# Patient Record
Sex: Female | Born: 1985
Health system: Southern US, Community
[De-identification: ages and names within clinical notes are randomized; demographics above are authoritative.]

## PROBLEM LIST (undated history)

## (undated) ENCOUNTER — Inpatient Hospital Stay (HOSPITAL_COMMUNITY): Payer: Self-pay

## (undated) DIAGNOSIS — I517 Cardiomegaly: Secondary | ICD-10-CM

## (undated) DIAGNOSIS — S61419A Laceration without foreign body of unspecified hand, initial encounter: Secondary | ICD-10-CM

## (undated) DIAGNOSIS — J40 Bronchitis, not specified as acute or chronic: Secondary | ICD-10-CM

## (undated) DIAGNOSIS — D134 Benign neoplasm of liver: Secondary | ICD-10-CM

## (undated) DIAGNOSIS — G4730 Sleep apnea, unspecified: Secondary | ICD-10-CM

## (undated) DIAGNOSIS — B009 Herpesviral infection, unspecified: Secondary | ICD-10-CM

## (undated) DIAGNOSIS — G039 Meningitis, unspecified: Secondary | ICD-10-CM

## (undated) DIAGNOSIS — N390 Urinary tract infection, site not specified: Secondary | ICD-10-CM

## (undated) DIAGNOSIS — R87629 Unspecified abnormal cytological findings in specimens from vagina: Secondary | ICD-10-CM

## (undated) DIAGNOSIS — R51 Headache: Secondary | ICD-10-CM

## (undated) DIAGNOSIS — J9819 Other pulmonary collapse: Secondary | ICD-10-CM

## (undated) DIAGNOSIS — F419 Anxiety disorder, unspecified: Secondary | ICD-10-CM

## (undated) DIAGNOSIS — E669 Obesity, unspecified: Secondary | ICD-10-CM

## (undated) DIAGNOSIS — N83201 Unspecified ovarian cyst, right side: Secondary | ICD-10-CM

## (undated) HISTORY — PX: OTHER SURGICAL HISTORY: SHX169

## (undated) HISTORY — PX: COLPOSCOPY: SHX161

---
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
## 2007-02-19 ENCOUNTER — Emergency Department (HOSPITAL_COMMUNITY): Admission: EM | Admit: 2007-02-19 | Discharge: 2007-02-19 | Payer: Self-pay | Admitting: Emergency Medicine

## 2007-05-18 ENCOUNTER — Emergency Department (HOSPITAL_COMMUNITY): Admission: EM | Admit: 2007-05-18 | Discharge: 2007-05-18 | Payer: Self-pay | Admitting: Emergency Medicine

## 2007-10-28 ENCOUNTER — Inpatient Hospital Stay (HOSPITAL_COMMUNITY): Admission: AD | Admit: 2007-10-28 | Discharge: 2007-10-28 | Payer: Self-pay | Admitting: Obstetrics & Gynecology

## 2007-11-29 ENCOUNTER — Ambulatory Visit: Payer: Self-pay | Admitting: Internal Medicine

## 2007-11-29 ENCOUNTER — Emergency Department (HOSPITAL_COMMUNITY): Admission: EM | Admit: 2007-11-29 | Discharge: 2007-11-29 | Payer: Self-pay | Admitting: Emergency Medicine

## 2007-11-29 ENCOUNTER — Encounter (INDEPENDENT_AMBULATORY_CARE_PROVIDER_SITE_OTHER): Payer: Self-pay | Admitting: Family Medicine

## 2007-11-29 LAB — CONVERTED CEMR LAB
ALT: 9 units/L (ref 0–35)
AST: 13 units/L (ref 0–37)
Basophils Absolute: 0 10*3/uL (ref 0.0–0.1)
Basophils Relative: 0 % (ref 0–1)
Calcium: 9 mg/dL (ref 8.4–10.5)
Chloride: 108 meq/L (ref 96–112)
Creatinine, Ser: 0.78 mg/dL (ref 0.40–1.20)
Free T4: 1.16 ng/dL (ref 0.89–1.80)
MCHC: 30.5 g/dL (ref 30.0–36.0)
Monocytes Absolute: 0.6 10*3/uL (ref 0.1–1.0)
Neutro Abs: 3.8 10*3/uL (ref 1.7–7.7)
Neutrophils Relative %: 53 % (ref 43–77)
Platelets: 310 10*3/uL (ref 150–400)
RDW: 15.7 % — ABNORMAL HIGH (ref 11.5–15.5)
Sodium: 142 meq/L (ref 135–145)
TSH: 2.539 microintl units/mL (ref 0.350–4.50)
Total CHOL/HDL Ratio: 2.8
VLDL: 8 mg/dL (ref 0–40)

## 2007-12-03 ENCOUNTER — Ambulatory Visit: Payer: Self-pay | Admitting: *Deleted

## 2008-01-30 ENCOUNTER — Emergency Department (HOSPITAL_COMMUNITY): Admission: EM | Admit: 2008-01-30 | Discharge: 2008-01-30 | Payer: Self-pay | Admitting: Emergency Medicine

## 2008-04-09 ENCOUNTER — Inpatient Hospital Stay (HOSPITAL_COMMUNITY): Admission: AD | Admit: 2008-04-09 | Discharge: 2008-04-09 | Payer: Self-pay | Admitting: Obstetrics & Gynecology

## 2008-12-22 ENCOUNTER — Observation Stay (HOSPITAL_COMMUNITY): Admission: AD | Admit: 2008-12-22 | Discharge: 2008-12-23 | Payer: Self-pay | Admitting: Obstetrics and Gynecology

## 2008-12-22 ENCOUNTER — Ambulatory Visit: Payer: Self-pay | Admitting: Obstetrics and Gynecology

## 2009-01-02 ENCOUNTER — Inpatient Hospital Stay (HOSPITAL_COMMUNITY): Admission: AD | Admit: 2009-01-02 | Discharge: 2009-01-02 | Payer: Self-pay | Admitting: Obstetrics & Gynecology

## 2009-01-02 ENCOUNTER — Ambulatory Visit: Payer: Self-pay | Admitting: Obstetrics and Gynecology

## 2009-01-07 ENCOUNTER — Inpatient Hospital Stay (HOSPITAL_COMMUNITY): Admission: AD | Admit: 2009-01-07 | Discharge: 2009-01-07 | Payer: Self-pay | Admitting: Obstetrics & Gynecology

## 2009-04-02 ENCOUNTER — Inpatient Hospital Stay (HOSPITAL_COMMUNITY): Admission: AD | Admit: 2009-04-02 | Discharge: 2009-04-02 | Payer: Self-pay | Admitting: Obstetrics and Gynecology

## 2009-05-12 ENCOUNTER — Inpatient Hospital Stay (HOSPITAL_COMMUNITY): Admission: AD | Admit: 2009-05-12 | Discharge: 2009-05-12 | Payer: Self-pay | Admitting: Obstetrics and Gynecology

## 2009-06-25 ENCOUNTER — Inpatient Hospital Stay (HOSPITAL_COMMUNITY): Admission: AD | Admit: 2009-06-25 | Discharge: 2009-06-25 | Payer: Self-pay | Admitting: Obstetrics and Gynecology

## 2009-07-19 ENCOUNTER — Inpatient Hospital Stay (HOSPITAL_COMMUNITY): Admission: AD | Admit: 2009-07-19 | Discharge: 2009-07-19 | Payer: Self-pay | Admitting: Obstetrics and Gynecology

## 2009-08-24 ENCOUNTER — Inpatient Hospital Stay (HOSPITAL_COMMUNITY): Admission: AD | Admit: 2009-08-24 | Discharge: 2009-08-27 | Payer: Self-pay | Admitting: Obstetrics and Gynecology

## 2009-09-02 ENCOUNTER — Inpatient Hospital Stay (HOSPITAL_COMMUNITY): Admission: AD | Admit: 2009-09-02 | Discharge: 2009-09-02 | Payer: Self-pay | Admitting: Obstetrics and Gynecology

## 2009-11-25 ENCOUNTER — Emergency Department (HOSPITAL_COMMUNITY): Admission: EM | Admit: 2009-11-25 | Discharge: 2009-11-25 | Payer: Self-pay | Admitting: Emergency Medicine

## 2010-07-10 LAB — URINALYSIS, ROUTINE W REFLEX MICROSCOPIC
Glucose, UA: NEGATIVE mg/dL
Hgb urine dipstick: NEGATIVE
Ketones, ur: 40 mg/dL — AB
Protein, ur: 30 mg/dL — AB

## 2010-07-10 LAB — WET PREP, GENITAL
Clue Cells Wet Prep HPF POC: NONE SEEN
Trich, Wet Prep: NONE SEEN
Yeast Wet Prep HPF POC: NONE SEEN

## 2010-07-10 LAB — URINE MICROSCOPIC-ADD ON

## 2010-07-12 ENCOUNTER — Emergency Department (HOSPITAL_COMMUNITY)
Admission: EM | Admit: 2010-07-12 | Discharge: 2010-07-12 | Disposition: A | Discharge status: Home / Self Care | Payer: Self-pay | Attending: Emergency Medicine | Admitting: Emergency Medicine

## 2010-07-12 DIAGNOSIS — R197 Diarrhea, unspecified: Secondary | ICD-10-CM | POA: Insufficient documentation

## 2010-07-12 DIAGNOSIS — R112 Nausea with vomiting, unspecified: Secondary | ICD-10-CM | POA: Insufficient documentation

## 2010-07-12 DIAGNOSIS — F341 Dysthymic disorder: Secondary | ICD-10-CM | POA: Insufficient documentation

## 2010-07-12 LAB — DIFFERENTIAL
Basophils Absolute: 0 10*3/uL (ref 0.0–0.1)
Basophils Relative: 0 % (ref 0–1)
Eosinophils Relative: 0 % (ref 0–5)
Lymphocytes Relative: 22 % (ref 12–46)
Neutro Abs: 6.1 10*3/uL (ref 1.7–7.7)

## 2010-07-12 LAB — CBC
HCT: 27 % — ABNORMAL LOW (ref 36.0–46.0)
HCT: 31.5 % — ABNORMAL LOW (ref 36.0–46.0)
Hemoglobin: 10.7 g/dL — ABNORMAL LOW (ref 12.0–15.0)
MCHC: 31.4 g/dL (ref 30.0–36.0)
MCHC: 31.8 g/dL (ref 30.0–36.0)
MCV: 78 fL (ref 78.0–100.0)
MCV: 78.7 fL (ref 78.0–100.0)
Platelets: 199 10*3/uL (ref 150–400)
Platelets: 319 10*3/uL (ref 150–400)
RBC: 4.31 MIL/uL (ref 3.87–5.11)
RDW: 16.1 % — ABNORMAL HIGH (ref 11.5–15.5)
RDW: 17.1 % — ABNORMAL HIGH (ref 11.5–15.5)

## 2010-07-12 LAB — URINALYSIS, ROUTINE W REFLEX MICROSCOPIC
Ketones, ur: >80 mg/dL — AB
Nitrite: NEGATIVE
Protein, ur: NEGATIVE mg/dL
Urobilinogen, UA: 1 mg/dL (ref 0.0–1.0)
pH: 6.5 (ref 5.0–8.0)

## 2010-07-17 LAB — URINALYSIS, ROUTINE W REFLEX MICROSCOPIC
Bilirubin Urine: NEGATIVE
Ketones, ur: NEGATIVE mg/dL
Nitrite: NEGATIVE
Protein, ur: NEGATIVE mg/dL
Specific Gravity, Urine: 1.02 (ref 1.005–1.030)
Urobilinogen, UA: 4 mg/dL — ABNORMAL HIGH (ref 0.0–1.0)

## 2010-07-26 LAB — URINALYSIS, ROUTINE W REFLEX MICROSCOPIC
Glucose, UA: NEGATIVE mg/dL
Hgb urine dipstick: NEGATIVE
Specific Gravity, Urine: 1.02 (ref 1.005–1.030)
pH: 6.5 (ref 5.0–8.0)

## 2010-07-26 LAB — URINE MICROSCOPIC-ADD ON

## 2010-07-29 LAB — URINALYSIS, ROUTINE W REFLEX MICROSCOPIC
Bilirubin Urine: NEGATIVE
Hgb urine dipstick: NEGATIVE
Ketones, ur: >80 mg/dL — AB
Ketones, ur: 15 mg/dL — AB
Nitrite: NEGATIVE
Nitrite: NEGATIVE
Protein, ur: 100 mg/dL — AB
Specific Gravity, Urine: 1.01 (ref 1.005–1.030)
pH: 7.5 (ref 5.0–8.0)

## 2010-07-29 LAB — URINE CULTURE: Colony Count: >=100000

## 2010-07-29 LAB — GC/CHLAMYDIA PROBE AMP, GENITAL
Chlamydia, DNA Probe: NEGATIVE
GC Probe Amp, Genital: NEGATIVE

## 2010-07-29 LAB — URINE MICROSCOPIC-ADD ON

## 2010-07-29 LAB — WET PREP, GENITAL
Trich, Wet Prep: NONE SEEN
Yeast Wet Prep HPF POC: NONE SEEN

## 2010-07-30 LAB — COMPREHENSIVE METABOLIC PANEL
AST: 17 U/L (ref 0–37)
Albumin: 4 g/dL (ref 3.5–5.2)
Alkaline Phosphatase: 62 U/L (ref 39–117)
BUN: 7 mg/dL (ref 6–23)
Chloride: 104 mEq/L (ref 96–112)
Potassium: 3.4 mEq/L — ABNORMAL LOW (ref 3.5–5.1)
Total Bilirubin: 0.6 mg/dL (ref 0.3–1.2)

## 2010-07-30 LAB — URINE MICROSCOPIC-ADD ON

## 2010-07-30 LAB — CBC
HCT: 35.9 % — ABNORMAL LOW (ref 36.0–46.0)
Platelets: 268 10*3/uL (ref 150–400)
RBC: 4.52 MIL/uL (ref 3.87–5.11)
WBC: 11.7 10*3/uL — ABNORMAL HIGH (ref 4.0–10.5)

## 2010-07-30 LAB — URINALYSIS, ROUTINE W REFLEX MICROSCOPIC
Bilirubin Urine: NEGATIVE
Glucose, UA: NEGATIVE mg/dL
Hgb urine dipstick: NEGATIVE
Ketones, ur: >80 mg/dL — AB
Protein, ur: NEGATIVE mg/dL
Urobilinogen, UA: 4 mg/dL — ABNORMAL HIGH (ref 0.0–1.0)

## 2010-07-30 LAB — POCT PREGNANCY, URINE: Preg Test, Ur: POSITIVE

## 2011-01-12 LAB — POCT PREGNANCY, URINE: Operator id: 284141

## 2011-01-19 LAB — URINALYSIS, ROUTINE W REFLEX MICROSCOPIC
Glucose, UA: NEGATIVE
Specific Gravity, Urine: 1.02

## 2011-01-19 LAB — URINE MICROSCOPIC-ADD ON

## 2011-01-19 LAB — GC/CHLAMYDIA PROBE AMP, GENITAL: GC Probe Amp, Genital: NEGATIVE

## 2011-01-27 LAB — URINALYSIS, ROUTINE W REFLEX MICROSCOPIC
Glucose, UA: NEGATIVE mg/dL
Specific Gravity, Urine: 1.02 (ref 1.005–1.030)

## 2011-01-27 LAB — URINE MICROSCOPIC-ADD ON

## 2011-01-27 LAB — GC/CHLAMYDIA PROBE AMP, GENITAL
Chlamydia, DNA Probe: NEGATIVE
GC Probe Amp, Genital: NEGATIVE

## 2011-01-27 LAB — WET PREP, GENITAL

## 2011-02-03 LAB — GC/CHLAMYDIA PROBE AMP, GENITAL
Chlamydia, DNA Probe: NEGATIVE
GC Probe Amp, Genital: NEGATIVE

## 2011-02-03 LAB — WET PREP, GENITAL

## 2011-02-03 LAB — URINE MICROSCOPIC-ADD ON

## 2011-02-03 LAB — URINALYSIS, ROUTINE W REFLEX MICROSCOPIC
Bilirubin Urine: NEGATIVE
Glucose, UA: NEGATIVE
Specific Gravity, Urine: 1.02
Urobilinogen, UA: 0.2

## 2011-02-20 ENCOUNTER — Inpatient Hospital Stay (INDEPENDENT_AMBULATORY_CARE_PROVIDER_SITE_OTHER)
Admission: RE | Admit: 2011-02-20 | Discharge: 2011-02-20 | Disposition: A | Discharge status: Home / Self Care | Payer: Self-pay | Source: Ambulatory Visit | Attending: Family Medicine | Admitting: Family Medicine

## 2011-02-20 DIAGNOSIS — H00019 Hordeolum externum unspecified eye, unspecified eyelid: Secondary | ICD-10-CM

## 2011-10-16 ENCOUNTER — Other Ambulatory Visit: Payer: Self-pay | Admitting: Obstetrics and Gynecology

## 2011-10-16 NOTE — Telephone Encounter (Signed)
Triage/epic 

## 2011-10-19 NOTE — Telephone Encounter (Signed)
PT CALLED REGARDING MSG, PT INFORMED HAS NOT BEEN SEEN IN >18 MONTHS, NEEDS TO SCHED AEX, PT STATES HAS NO INSURANCE RIGHT NOW, PT INFORMED IS ABOUT $179 OOP, PT ASKS CAN PAYMENT ARRANGEMENTS BE MADE.  INFORMED MSG LEFT FOR TS REGARDING ARRANGEMENTS, WILL CALL BACK TODAY WITH RESPONS, PT VOICES AGREEMENT.

## 2011-10-20 ENCOUNTER — Encounter (HOSPITAL_COMMUNITY): Payer: Self-pay | Admitting: Emergency Medicine

## 2011-10-20 ENCOUNTER — Emergency Department (HOSPITAL_COMMUNITY)
Admission: EM | Admit: 2011-10-20 | Discharge: 2011-10-21 | Disposition: A | Discharge status: Home / Self Care | Payer: Self-pay | Attending: Emergency Medicine | Admitting: Emergency Medicine

## 2011-10-20 ENCOUNTER — Emergency Department (HOSPITAL_COMMUNITY): Payer: Self-pay

## 2011-10-20 DIAGNOSIS — R079 Chest pain, unspecified: Secondary | ICD-10-CM | POA: Insufficient documentation

## 2011-10-20 DIAGNOSIS — M549 Dorsalgia, unspecified: Secondary | ICD-10-CM | POA: Insufficient documentation

## 2011-10-20 LAB — BASIC METABOLIC PANEL
BUN: 13 mg/dL (ref 6–23)
Chloride: 104 mEq/L (ref 96–112)
GFR calc Af Amer: >90 mL/min (ref >90)
Potassium: 3.6 mEq/L (ref 3.5–5.1)
Sodium: 140 mEq/L (ref 135–145)

## 2011-10-20 LAB — CBC WITH DIFFERENTIAL/PLATELET
HCT: 34.9 % — ABNORMAL LOW (ref 36.0–46.0)
Hemoglobin: 11.3 g/dL — ABNORMAL LOW (ref 12.0–15.0)
Lymphocytes Relative: 38 % (ref 12–46)
Lymphs Abs: 3.7 10*3/uL (ref 0.7–4.0)
Monocytes Absolute: 0.7 10*3/uL (ref 0.1–1.0)
Monocytes Relative: 7 % (ref 3–12)
Neutro Abs: 5.1 10*3/uL (ref 1.7–7.7)
Neutrophils Relative %: 52 % (ref 43–77)
RBC: 4.55 MIL/uL (ref 3.87–5.11)
WBC: 9.9 10*3/uL (ref 4.0–10.5)

## 2011-10-20 LAB — POCT I-STAT TROPONIN I

## 2011-10-20 NOTE — ED Notes (Signed)
PT. REPORTS UPPER CHEST Shon Baton Maura Crandall ARM PAIN AND UPPER BACK PAIN WORSE WITH DEEP INSPIRATION ONSET LAST NIGHT , SLIGHT SOB , NO COUGH.

## 2011-10-20 NOTE — ED Notes (Signed)
Updated in w/r 

## 2011-10-21 LAB — D-DIMER, QUANTITATIVE: D-Dimer, Quant: 0.43 ug/mL-FEU (ref 0.00–0.48)

## 2011-10-21 MED ORDER — IBUPROFEN 800 MG PO TABS
800.0000 mg | ORAL_TABLET | Freq: Once | ORAL | Status: AC
  Administered 2011-10-21: 800 mg via ORAL
  Filled 2011-10-21: qty 1

## 2011-10-21 NOTE — ED Notes (Signed)
Patient states that she has pain that starts in her back that radiates to the front and into her arms.  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

## 2011-10-21 NOTE — Discharge Instructions (Signed)
Your chest x-ray is normal.  Your blood tests do not show any signs of damage to your heart.  A blood clot in your lungs or other significant illness.  Use ibuprofen 600 mg every 6 hours for pain.  Alternatively, you can use Tylenol 500 mg every 6 hours for pain.  Followup with your Dr. if your symptoms persist

## 2011-10-21 NOTE — ED Provider Notes (Addendum)
History     CSN: [id number]  Arrival date & time 10/20/11  2007   First MD Initiated Contact with Patient 10/20/11 2354      Chief Complaint  Patient presents with  . Chest Pain    (Consider location/radiation/quality/duration/timing/severity/associated sxs/prior treatment) Patient is a 26 y.o. female presenting with chest pain. The history is provided by the patient.  Chest Pain Pertinent negatives for primary symptoms include no fever, no shortness of breath, no cough, no abdominal pain, no nausea and no vomiting.    the patient is a 26 year old, female, with no significant past medical history.  She presents to emergency department complaining of pleuritic chest pain, and back pain.  Since yesterday.  She has not had a cough, fevers, chills, nausea, vomiting, or sweating.  She denies leg pain or swelling.  She does not take birth control pills.  She denies recent travel or surgery.  The patient has been constant.  It does not radiate  History reviewed. No pertinent past medical history.  History reviewed. No pertinent past surgical history.  No family history on file.  History  Substance Use Topics  . Smoking status: Never Smoker   . Smokeless tobacco: Not on file  . Alcohol Use: Yes    OB History    Grav Para Term Preterm Abortions TAB SAB Ect Mult Living                  Review of Systems  Constitutional: Negative for fever and chills.  HENT: Negative for congestion.   Respiratory: Negative for cough, chest tightness and shortness of breath.   Cardiovascular: Positive for chest pain. Negative for leg swelling.  Gastrointestinal: Negative for nausea, vomiting and abdominal pain.  Musculoskeletal: Positive for back pain.  Skin: Negative for rash.  Neurological: Negative for headaches.  Psychiatric/Behavioral: Negative for confusion.  All other systems reviewed and are negative.    Allergies  Review of patient's allergies indicates no known allergies.  Home  Medications   Current Outpatient Rx  Name Route Sig Dispense Refill  . IBUPROFEN 200 MG PO TABS Oral Take 400 mg by mouth every 6 (six) hours as needed. For pain      BP 128/68  Pulse 71  Temp 99 F (37.2 C) (Oral)  Resp 19  SpO2 100%  LMP 10/05/2011  Physical Exam  Nursing note and vitals reviewed. Constitutional: She is oriented to person, place, and time. She appears well-developed and well-nourished. No distress.  HENT:  Head: Normocephalic and atraumatic.  Eyes: Conjunctivae are normal.  Neck: Normal range of motion.  Cardiovascular: Normal rate.   No murmur heard. Pulmonary/Chest: Effort normal and breath sounds normal. She has no rales.  Abdominal: Soft. She exhibits no distension.  Musculoskeletal: Normal range of motion. She exhibits no edema and no tenderness.  Neurological: She is alert and oriented to person, place, and time.  Skin: Skin is warm and dry.  Psychiatric: She has a normal mood and affect. Thought content normal.    ED Course  Procedures (including critical care time) 26 year old, obese, female, complains of for chest pain, and back pain.  Since yesterday.  She has no risk factors for pulmonary embolism, and she is without lower extremity, swelling.  Her symptoms are not consistent with pneumonia.  And of course, her is no evidence, that she's got ACS.  Labs Reviewed  BASIC METABOLIC PANEL - Abnormal; Notable for the following:    Glucose, Bld 114 (*)     All  other components within normal limits  CBC WITH DIFFERENTIAL - Abnormal; Notable for the following:    Hemoglobin 11.3 (*)     HCT 34.9 (*)     MCV 76.7 (*)     MCH 24.8 (*)     All other components within normal limits  POCT I-STAT TROPONIN I  D-DIMER, QUANTITATIVE   Dg Chest 2 View  10/20/2011  *RADIOLOGY REPORT*  Clinical Data: Chest pain  CHEST - 2 VIEW  Comparison: 01/30/2008  Findings: Lungs are clear. No pleural effusion or pneumothorax. The cardiomediastinal contours are within  normal limits. The visualized bones and soft tissues are without significant appreciable abnormality.  IMPRESSION: No radiographic evidence of acute cardiopulmonary process.  Original Report Authenticated By: Waneta Martins, M.D.     No diagnosis found.  ECG Normal sinus rhythm, heart rate 83 beats per minute. Normal axis. Normal intervals. Nonspecific T-wave changes  MDM  PLEURITIC CHEST PAIN  No evidence of significant illness.  There is no evidence of a pulmonary embolism.  ACS or cardiopulmonary disease.  The patient is in no distress with no signs of hypoxia, or other disease.        Cheri Guppy, MD 10/21/11 1610  Cheri Guppy, MD 11/19/11 [phone]

## 2012-01-08 ENCOUNTER — Emergency Department (HOSPITAL_COMMUNITY): Payer: Self-pay

## 2012-01-08 ENCOUNTER — Emergency Department (HOSPITAL_COMMUNITY)
Admission: EM | Admit: 2012-01-08 | Discharge: 2012-01-08 | Disposition: A | Discharge status: Home Health | Payer: Self-pay | Attending: Emergency Medicine | Admitting: Emergency Medicine

## 2012-01-08 ENCOUNTER — Encounter (HOSPITAL_COMMUNITY): Payer: Self-pay | Admitting: *Deleted

## 2012-01-08 DIAGNOSIS — J40 Bronchitis, not specified as acute or chronic: Secondary | ICD-10-CM | POA: Insufficient documentation

## 2012-01-08 MED ORDER — DEXTROMETHORPHAN POLISTIREX 30 MG/5ML PO LQCR: 60.0000 mg | ORAL | Status: DC | PRN

## 2012-01-08 MED ORDER — ALBUTEROL SULFATE HFA 108 (90 BASE) MCG/ACT IN AERS
2.0000 | INHALATION_SPRAY | Freq: Once | RESPIRATORY_TRACT | Status: AC
  Administered 2012-01-08: 2 via RESPIRATORY_TRACT
  Filled 2012-01-08: qty 6.7

## 2012-01-08 NOTE — ED Notes (Signed)
Pt discharged home in good condition. 

## 2012-01-08 NOTE — ED Notes (Signed)
Cold cough with a low grade temp for approx one week.  No resp difficulty

## 2012-01-08 NOTE — ED Provider Notes (Signed)
History     CSN: [id number]  Arrival date & time 01/08/12  4098   First MD Initiated Contact with Patient 01/08/12 2029      Chief Complaint  Patient presents with  . Cough    (Consider location/radiation/quality/duration/timing/severity/associated sxs/prior treatment) HPI Comments: Patient reports, that last week.  She had URI symptoms, with some throat thickening and a nonproductive cough.  That is only gotten worse.  She states at first, the cough was mostly during the, day, but, now it's more at night.  She denies any rhinitis, postnasal drip, history of smoking or asthma.  She has not taken any over-the-counter medication, specifically for cough.  Patient is a 26 y.o. female presenting with cough. The history is provided by the patient.  Cough This is a new problem. The current episode started more than 1 week ago. The problem occurs every few minutes. The problem has not changed since onset.The cough is non-productive. Pertinent negatives include no chest pain, no chills, no headaches, no rhinorrhea, no shortness of breath and no wheezing.    History reviewed. No pertinent past medical history.  History reviewed. No pertinent past surgical history.  No family history on file.  History  Substance Use Topics  . Smoking status: Never Smoker   . Smokeless tobacco: Not on file  . Alcohol Use: Yes    OB History    Grav Para Term Preterm Abortions TAB SAB Ect Mult Living                  Review of Systems  Constitutional: Negative for fever and chills.  HENT: Negative for nosebleeds, congestion, rhinorrhea and postnasal drip.   Respiratory: Positive for cough. Negative for shortness of breath, wheezing and stridor.   Cardiovascular: Negative for chest pain and leg swelling.  Gastrointestinal: Negative for abdominal pain.  Neurological: Negative for dizziness, weakness and headaches.    Allergies  Review of patient's allergies indicates no known allergies.  Home  Medications   Current Outpatient Rx  Name Route Sig Dispense Refill  . DEXTROMETHORPHAN POLISTIREX ER 30 MG/5ML PO LQCR Oral Take 10 mLs (60 mg total) by mouth as needed for cough. 89 mL 0    BP 134/111  Pulse 95  Temp 99.1 F (37.3 C) (Oral)  Resp 18  SpO2 97%  LMP 12/16/2011  Physical Exam  Constitutional: She is oriented to person, place, and time. She appears well-developed and well-nourished.  HENT:  Head: Normocephalic.  Eyes: Pupils are equal, round, and reactive to light.  Neck: Normal range of motion.  Cardiovascular: Normal rate.   Pulmonary/Chest: Effort normal. No respiratory distress. She has no wheezes.       Harsh nonproductive cough with exertion, or at the end of a long sentence  Abdominal: Soft.  Musculoskeletal: Normal range of motion.  Neurological: She is alert and oriented to person, place, and time.  Skin: Skin is warm.    ED Course  Procedures (including critical care time)  Labs Reviewed - No data to display Dg Chest 2 View  01/08/2012  *RADIOLOGY REPORT*  Clinical Data: Shortness of breath and cough.  CHEST - 2 VIEW  Comparison: 10/20/2011  Findings: The heart size and pulmonary vascularity are normal and the lungs are clear except for slight peribronchial thickening consistent with bronchitis.  No effusions.  No osseous abnormality.  IMPRESSION: Slight bronchitic changes.   Original Report Authenticated By: Gwynn Burly, M.D.      1. Bronchitis  MDM  I reviewed the patient's x-ray, which reveals bronchitic, changes.  We'll treat with albuterol         Arman Filter, NP 01/08/12 2133  Arman Filter, NP 01/08/12 2133

## 2012-01-09 NOTE — ED Provider Notes (Signed)
Cardiac Catheterization Procedure Note  Name: Tara Bender  MRN: [id number]  DOB: 10/01/1969  Procedure: Left Heart Cath, Selective Coronary Angiography, LV angiography  Indication: A 26 year old gentleman with strong family history of premature coronary artery disease has presented with progressive chest pain. He was referred for cardiac catheterization because of high pretest probability of obstructive CAD and concerns for unstable angina.  Procedural details: The right groin was prepped, draped, and anesthetized with 1% lidocaine. Using modified Seldinger technique, a 5 French sheath was introduced into the right femoral artery. Standard Judkins catheters were used for coronary angiography and left ventriculography. Catheter exchanges were performed over a guidewire. There were no immediate procedural complications. The patient was transferred to the post catheterization recovery area for further monitoring.  Procedural Findings:  Hemodynamics:  AO 93/58  LV 100/7  Coronary angiography:  Coronary dominance: right  Left mainstem: Widely patent with no obstructive disease.  Left anterior descending (LAD): The LAD courses to the left ventricular apex. The vessel is widely patent. It supplies a tiny first diagonal and a large second diagonal branch there is no obstructive disease throughout.  Left circumflex (LCx): The circumflex is a large vessel with no obstructive disease. There is a large first obtuse marginal branch that divides into twin vessels. The AV groove circumflex extends to provide a posterolateral branch distally.  Right coronary artery (RCA): The RCA is dominant. The vessel is smooth throughout its course. There is no obstructive disease visualized. There is an acute marginal branch and a PDA branch supplied.  Left ventriculography: Left ventricular systolic function is normal, LVEF is estimated at 55-65%, there is no significant mitral regurgitation  Final Conclusions:  1. No  obstructive coronary artery disease with widely patent coronary arteries  2. Normal left ventricular function  Recommendations: Suspect noncardiac chest pain. The patient has no apparent cardiac disease.  Tonny Bollman  05/25/2011, 8:15 AM    Celene Kras, MD 01/09/12 [phone]

## 2012-02-02 ENCOUNTER — Emergency Department (HOSPITAL_COMMUNITY)
Admission: EM | Admit: 2012-02-02 | Discharge: 2012-02-02 | Disposition: A | Discharge status: Home / Self Care | Payer: Self-pay | Attending: Emergency Medicine | Admitting: Emergency Medicine

## 2012-02-02 ENCOUNTER — Encounter (HOSPITAL_COMMUNITY): Payer: Self-pay | Admitting: *Deleted

## 2012-02-02 DIAGNOSIS — L309 Dermatitis, unspecified: Secondary | ICD-10-CM

## 2012-02-02 DIAGNOSIS — L259 Unspecified contact dermatitis, unspecified cause: Secondary | ICD-10-CM | POA: Insufficient documentation

## 2012-02-02 DIAGNOSIS — E669 Obesity, unspecified: Secondary | ICD-10-CM | POA: Insufficient documentation

## 2012-02-02 HISTORY — DX: Obesity, unspecified: E66.9

## 2012-02-02 NOTE — ED Notes (Signed)
Pt began loosing her hair in September she then began having a rash on her breasts followed by rash on her chest.  Pt also has pain in her legs and a HA.

## 2012-02-02 NOTE — ED Provider Notes (Signed)
History     CSN: [id number]  Arrival date & time 02/02/12  1339   First MD Initiated Contact with Patient 02/02/12 1858      Chief Complaint  Patient presents with  . Rash    (Consider location/radiation/quality/duration/timing/severity/associated sxs/prior treatment) HPI Complains of rash on chest and underneath her breasts for approximately 3 weeks rash is pruritic and scaly. No treatment prior to coming here no fever. Rash started approximately 3 days after she evaluated for bronchitis. Cough is much improved patient also complains of a slight diffuse headache and slight pain in both legs worse with walking for the past several weeks no other complaint no treatment prior to coming here nothing makes symptoms better or worse Past Medical History  Diagnosis Date  . Obesity     History reviewed. No pertinent past surgical history. Past medical history negative No family history on file.  History  Substance Use Topics  . Smoking status: Never Smoker   . Smokeless tobacco: Not on file  . Alcohol Use: Yes    OB History    Grav Para Term Preterm Abortions TAB SAB Ect Mult Living                  Review of Systems  Musculoskeletal: Positive for myalgias.  Skin: Positive for rash.  Neurological: Positive for headaches.  All other systems reviewed and are negative.    Allergies  Review of patient's allergies indicates no known allergies.  Home Medications   Current Outpatient Rx  Name Route Sig Dispense Refill  . ALBUTEROL SULFATE HFA 108 (90 BASE) MCG/ACT IN AERS Inhalation Inhale 2 puffs into the lungs every 6 (six) hours as needed. For wheezing      BP 132/68  Pulse 88  Temp 98.5 F (36.9 C) (Oral)  Resp 18  SpO2 100%  LMP 01/16/2012  Physical Exam  Nursing note and vitals reviewed. Constitutional: She is oriented to person, place, and time. She appears well-developed and well-nourished.  HENT:  Head: Normocephalic and atraumatic.  Eyes: Conjunctivae  normal are normal. Pupils are equal, round, and reactive to light.  Neck: Neck supple. No tracheal deviation present. No thyromegaly present.  Cardiovascular: Normal rate and regular rhythm.   No murmur heard. Pulmonary/Chest: Effort normal and breath sounds normal.  Abdominal: Soft. Bowel sounds are normal. She exhibits no distension. There is no tenderness.  Musculoskeletal: Normal range of motion. She exhibits no edema and no tenderness.       Lower extremities without redness swelling or tenderness neurovascularly intact  Neurological: She is alert and oriented to person, place, and time. No cranial nerve deficit. Coordination normal.       Gait normal  Skin: Skin is warm and dry. Rash noted.       There is approximately a 3 cm x 5 cm oblong area at left parasternal area which is scaly, shiny pinkish gray. Pruritic Underneath both breasts skin is scaly pinkish gray, pruritic  Psychiatric: She has a normal mood and affect.    ED Course  Procedures (including critical care time)  Labs Reviewed - No data to display No results found.   No diagnosis found.  Patient declined pain medicine for headache or leg pain in the emergency department MDM  Rash is fungal-appearing, Patient states her headache is a normal headache for her Leg pain there is no evidence of phlebitis or infection clinically Plan Tylenol, Lotrimin Referral resource guide Referral  dermatology   Doug Sou, MD 02/02/12 Ernestina [place]

## 2012-02-02 NOTE — ED Notes (Signed)
Pt A.O. X 4. Reports itchy rash in between breasts x 1 week. Denies pain. States "there was a rash a few weeks ago that went away, then this one came". All other skin in tact. Reports hair loss after been treated for Bronchiolitis in mid September 2013. Denies pain at this time. NAD.

## 2012-02-05 ENCOUNTER — Encounter (HOSPITAL_COMMUNITY): Payer: Self-pay

## 2012-03-07 ENCOUNTER — Emergency Department (HOSPITAL_COMMUNITY)
Admission: EM | Admit: 2012-03-07 | Discharge: 2012-03-07 | Disposition: A | Discharge status: Home / Self Care | Payer: Self-pay | Attending: Emergency Medicine | Admitting: Emergency Medicine

## 2012-03-07 DIAGNOSIS — W261XXA Contact with sword or dagger, initial encounter: Secondary | ICD-10-CM | POA: Insufficient documentation

## 2012-03-07 DIAGNOSIS — Y93G3 Activity, cooking and baking: Secondary | ICD-10-CM | POA: Insufficient documentation

## 2012-03-07 DIAGNOSIS — E669 Obesity, unspecified: Secondary | ICD-10-CM | POA: Insufficient documentation

## 2012-03-07 DIAGNOSIS — Y92009 Unspecified place in unspecified non-institutional (private) residence as the place of occurrence of the external cause: Secondary | ICD-10-CM | POA: Insufficient documentation

## 2012-03-07 DIAGNOSIS — S61439A Puncture wound without foreign body of unspecified hand, initial encounter: Secondary | ICD-10-CM

## 2012-03-07 DIAGNOSIS — S61409A Unspecified open wound of unspecified hand, initial encounter: Secondary | ICD-10-CM | POA: Insufficient documentation

## 2012-03-07 DIAGNOSIS — W260XXA Contact with knife, initial encounter: Secondary | ICD-10-CM | POA: Insufficient documentation

## 2012-03-07 MED ORDER — LORAZEPAM 1 MG PO TABS
1.0000 mg | ORAL_TABLET | Freq: Once | ORAL | Status: AC
  Administered 2012-03-07: 1 mg via ORAL
  Filled 2012-03-07: qty 1

## 2012-03-07 MED ORDER — HYDROCODONE-ACETAMINOPHEN 5-325 MG PO TABS: 1.0000 | ORAL_TABLET | ORAL | Status: DC | PRN

## 2012-03-07 MED ORDER — HYDROCODONE-ACETAMINOPHEN 5-325 MG PO TABS
1.0000 | ORAL_TABLET | Freq: Once | ORAL | Status: AC
  Administered 2012-03-07: 1 via ORAL
  Filled 2012-03-07: qty 1

## 2012-03-07 MED ORDER — SULFAMETHOXAZOLE-TRIMETHOPRIM 800-160 MG PO TABS: 1.0000 | ORAL_TABLET | Freq: Two times a day (BID) | ORAL | Status: DC

## 2012-03-07 NOTE — ED Notes (Signed)
Pt verbalizes understanding 

## 2012-03-07 NOTE — ED Provider Notes (Signed)
History     CSN: [id number]  Arrival date & time 03/07/12  1816   First MD Initiated Contact with Patient 03/07/12 1844      Chief Complaint  Patient presents with  . Laceration    (Consider location/radiation/quality/duration/timing/severity/associated sxs/prior treatment) HPI Comments: Patient reports she was prying cold brownies out of a pan last night and accidentally stabbed herself in the left palm.  Reports EMS came and treated her, was not transported to hospital.  States overnight she began to have pain, swelling, and tingling in her middle finger, now is unable to fully flex finger due to pain.  Denies other injury.  States tetanus vx was within past 5 years.    Patient is a 26 y.o. female presenting with skin laceration. The history is provided by the patient.  Laceration     Past Medical History  Diagnosis Date  . Obesity     No past surgical history on file.  No family history on file.  History  Substance Use Topics  . Smoking status: Never Smoker   . Smokeless tobacco: Not on file  . Alcohol Use: Yes    OB History    Grav Para Term Preterm Abortions TAB SAB Ect Mult Living                  Review of Systems  Constitutional: Negative for fever.  Skin: Positive for wound. Negative for color change, pallor and rash.  Neurological: Positive for numbness. Negative for weakness.    Allergies  Review of patient's allergies indicates no known allergies.  Home Medications   Current Outpatient Rx  Name  Route  Sig  Dispense  Refill  . ALBUTEROL SULFATE HFA 108 (90 BASE) MCG/ACT IN AERS   Inhalation   Inhale 2 puffs into the lungs every 6 (six) hours as needed. For wheezing         . IBUPROFEN 200 MG PO TABS   Oral   Take 200 mg by mouth every 6 (six) hours as needed. Pain         . VITAMINS A & D EX OINT   Topical   Apply 1 application topically as needed. Laceration pain           BP 115/86  Pulse 69  Temp 98.6 F (37 C) (Oral)   Resp 19  SpO2 100%  Physical Exam  Nursing note and vitals reviewed. Constitutional: She appears well-developed and well-nourished. No distress.  HENT:  Head: Normocephalic and atraumatic.  Neck: Neck supple.  Pulmonary/Chest: Effort normal.  Musculoskeletal:       Hands:      Middle finger with mild edema, decreased strength and unable to fully flex without pain.  No erythema, warmth.    Neurological: She is alert.  Skin: She is not diaphoretic.    ED Course  Procedures (including critical care time)  Labs Reviewed - No data to display No results found.  7:19 PM Patient discussed with Dr Patria Mane who has also seen and examined patient.  Dr Patria Mane to discuss pt with Dr [name].    7:43 PM Dr Patria Mane has spoken with Dr [name].  Plan is to numb punctures area, clean out thoroughly, examine tendon sheath if possible, d/c home on antibiotics and dorsal blocking splint, have pt call Dr [name] to be seen in the office tomorrow.  I discussed this with the patient who states she "can't do needles."  Agrees to try to tolerate procedure if I give  her sedating medication.  Pt is not driving.  Will given ativan prior to attempt.  I have discussed risks of not cleaning out the wound with the patient.  Pt verbalizes understanding and agrees with plan.    I was able to anesthetize and explore the wound.  Wound base is clean and without apparent infection.  Unable to visualize tendon, even with flexion and extension of finger.    1. Puncture wound of hand      MDM  Pt with accidental puncture wound to left palm two days ago, now with increased pain and swelling over palm and extending into middle finger.  Pt with increased pain with full flexion and extension of finger.  Wound anesthetized and explored - unable to visualize tendon sheath, unsure whether there was damage to this.  Concern for tendon sheath laceration, early infection.  Dr [name] consulted by Dr Patria Mane.  Pt to see Dr [name] in his office  tomorrow.  Pt d/c home on bactrim and pain medication per Dr Merrilee Seashore recommendation.  Discussed concerns, treatment plant, and follow up with patient.  Pt verbalizes understanding and agrees with plan.           [place], [place] 03/08/12 [phone]

## 2012-03-07 NOTE — ED Notes (Signed)
Pt present to Ed with c/o knife puncture to palm of left hand last knife while cooking.  Pt reports swelling and tingling today.  No obvious deformities noted. Pt called ems last night and was treated at scene

## 2012-03-12 NOTE — ED Provider Notes (Addendum)
Medical screening examination/treatment/procedure(s) were conducted as a shared visit with non-physician practitioner(s) and myself.  I personally evaluated the patient during the encounter  Patient with what appears to be a puncture wound palm of her hand.  She does have pain with range of motion of her third finger in flexion.  She is able to flex it.  She may have a partial flexor tendon injury versus early flexor tenosynovitis given her pain with passive and active range of motion.  I discussed her case with the hand surgeon Dr. [name], and after long discussion we chose to wash the patient's wound out some the patient home on antibiotics and Dr. [name] will see the patient in the office first thing in the morning.     Lyanne Co, MD 03/12/12 1610  Lyanne Co, MD 03/12/12 [phone]

## 2012-03-13 ENCOUNTER — Encounter (HOSPITAL_BASED_OUTPATIENT_CLINIC_OR_DEPARTMENT_OTHER): Payer: Self-pay | Admitting: *Deleted

## 2012-03-13 ENCOUNTER — Other Ambulatory Visit: Payer: Self-pay | Admitting: Orthopedic Surgery

## 2012-03-14 ENCOUNTER — Encounter (HOSPITAL_BASED_OUTPATIENT_CLINIC_OR_DEPARTMENT_OTHER): Payer: Self-pay | Admitting: Certified Registered"

## 2012-03-14 ENCOUNTER — Encounter (HOSPITAL_BASED_OUTPATIENT_CLINIC_OR_DEPARTMENT_OTHER): Payer: Self-pay | Admitting: Anesthesiology

## 2012-03-14 ENCOUNTER — Encounter (HOSPITAL_BASED_OUTPATIENT_CLINIC_OR_DEPARTMENT_OTHER)
Admission: RE | Disposition: A | Discharge status: Home / Self Care | Payer: Self-pay | Source: Ambulatory Visit | Attending: Orthopedic Surgery

## 2012-03-14 ENCOUNTER — Ambulatory Visit (HOSPITAL_BASED_OUTPATIENT_CLINIC_OR_DEPARTMENT_OTHER)
Admission: RE | Admit: 2012-03-14 | Discharge: 2012-03-14 | Disposition: A | Discharge status: Home / Self Care | Payer: Self-pay | Source: Ambulatory Visit | Attending: Orthopedic Surgery | Admitting: Orthopedic Surgery

## 2012-03-14 ENCOUNTER — Ambulatory Visit (HOSPITAL_BASED_OUTPATIENT_CLINIC_OR_DEPARTMENT_OTHER): Payer: Self-pay | Admitting: Certified Registered"

## 2012-03-14 ENCOUNTER — Encounter (HOSPITAL_BASED_OUTPATIENT_CLINIC_OR_DEPARTMENT_OTHER): Payer: Self-pay | Admitting: Orthopedic Surgery

## 2012-03-14 DIAGNOSIS — W261XXA Contact with sword or dagger, initial encounter: Secondary | ICD-10-CM | POA: Insufficient documentation

## 2012-03-14 DIAGNOSIS — S61409A Unspecified open wound of unspecified hand, initial encounter: Secondary | ICD-10-CM | POA: Insufficient documentation

## 2012-03-14 DIAGNOSIS — W260XXA Contact with knife, initial encounter: Secondary | ICD-10-CM | POA: Insufficient documentation

## 2012-03-14 HISTORY — DX: Laceration without foreign body of unspecified hand, initial encounter: S61.419A

## 2012-03-14 HISTORY — DX: Bronchitis, not specified as acute or chronic: J40

## 2012-03-14 HISTORY — PX: WOUND EXPLORATION: SHX6188

## 2012-03-14 SURGERY — WOUND EXPLORATION
Anesthesia: General | Site: Hand | Laterality: Left | Wound class: Clean

## 2012-03-14 MED ORDER — OXYCODONE HCL 5 MG/5ML PO SOLN: 5.0000 mg | Freq: Once | ORAL | Status: DC | PRN

## 2012-03-14 MED ORDER — LACTATED RINGERS IV SOLN
INTRAVENOUS | Status: DC
  Administered 2012-03-14: 10:00:00 via INTRAVENOUS

## 2012-03-14 MED ORDER — FENTANYL CITRATE 0.05 MG/ML IJ SOLN
50.0000 ug | INTRAMUSCULAR | Status: DC | PRN
  Administered 2012-03-14: 100 ug via INTRAVENOUS

## 2012-03-14 MED ORDER — FENTANYL CITRATE 0.05 MG/ML IJ SOLN
INTRAMUSCULAR | Status: DC | PRN
  Administered 2012-03-14 (×2): 50 ug via INTRAVENOUS

## 2012-03-14 MED ORDER — HYDROCODONE-ACETAMINOPHEN 5-325 MG PO TABS: ORAL_TABLET | ORAL | Status: DC

## 2012-03-14 MED ORDER — BUPIVACAINE HCL (PF) 0.5 % IJ SOLN
INTRAMUSCULAR | Status: DC | PRN
  Administered 2012-03-14: 25 mL

## 2012-03-14 MED ORDER — LIDOCAINE HCL (CARDIAC) 20 MG/ML IV SOLN
INTRAVENOUS | Status: DC | PRN
  Administered 2012-03-14: 55 mg via INTRAVENOUS

## 2012-03-14 MED ORDER — BUPIVACAINE HCL (PF) 0.25 % IJ SOLN
INTRAMUSCULAR | Status: DC | PRN
  Administered 2012-03-14: 9 mL

## 2012-03-14 MED ORDER — MIDAZOLAM HCL 5 MG/5ML IJ SOLN
INTRAMUSCULAR | Status: DC | PRN
  Administered 2012-03-14: 1 mg via INTRAVENOUS

## 2012-03-14 MED ORDER — OXYCODONE HCL 5 MG PO TABS: 5.0000 mg | ORAL_TABLET | Freq: Once | ORAL | Status: DC | PRN

## 2012-03-14 MED ORDER — CEFAZOLIN SODIUM-DEXTROSE 2-3 GM-% IV SOLR
2.0000 g | Freq: Once | INTRAVENOUS | Status: AC
  Administered 2012-03-14: 2 g via INTRAVENOUS

## 2012-03-14 MED ORDER — GLYCOPYRROLATE 0.2 MG/ML IJ SOLN
INTRAMUSCULAR | Status: DC | PRN
  Administered 2012-03-14: 0.2 mg via INTRAVENOUS

## 2012-03-14 MED ORDER — PROPOFOL 10 MG/ML IV BOLUS
INTRAVENOUS | Status: DC | PRN
  Administered 2012-03-14: 200 mg via INTRAVENOUS
  Administered 2012-03-14: 100 mg via INTRAVENOUS

## 2012-03-14 MED ORDER — HYDROMORPHONE HCL PF 1 MG/ML IJ SOLN
0.2500 mg | INTRAMUSCULAR | Status: DC | PRN
  Administered 2012-03-14 (×3): 0.5 mg via INTRAVENOUS

## 2012-03-14 MED ORDER — DEXAMETHASONE SODIUM PHOSPHATE 10 MG/ML IJ SOLN
INTRAMUSCULAR | Status: DC | PRN
  Administered 2012-03-14: 5 mg

## 2012-03-14 MED ORDER — ONDANSETRON HCL 4 MG/2ML IJ SOLN: 4.0000 mg | Freq: Once | INTRAMUSCULAR | Status: DC | PRN

## 2012-03-14 MED ORDER — DEXAMETHASONE SODIUM PHOSPHATE 4 MG/ML IJ SOLN
INTRAMUSCULAR | Status: DC | PRN
  Administered 2012-03-14: 10 mg via INTRAVENOUS

## 2012-03-14 MED ORDER — ONDANSETRON HCL 4 MG/2ML IJ SOLN
INTRAMUSCULAR | Status: DC | PRN
  Administered 2012-03-14: 4 mg via INTRAVENOUS

## 2012-03-14 MED ORDER — MIDAZOLAM HCL 2 MG/2ML IJ SOLN
1.0000 mg | INTRAMUSCULAR | Status: DC | PRN
  Administered 2012-03-14: 2 mg via INTRAVENOUS

## 2012-03-14 SURGICAL SUPPLY — 93 items
BAG DECANTER FOR FLEXI CONT (MISCELLANEOUS) IMPLANT
BALL CTTN LRG ABS STRL LF (GAUZE/BANDAGES/DRESSINGS)
BANDAGE CONFORM 2  STR LF (GAUZE/BANDAGES/DRESSINGS) ×3 IMPLANT
BANDAGE ELASTIC 3 VELCRO ST LF (GAUZE/BANDAGES/DRESSINGS) ×3 IMPLANT
BANDAGE GAUZE ELAST BULKY 4 IN (GAUZE/BANDAGES/DRESSINGS) ×3 IMPLANT
BLADE MINI RND TIP GREEN BEAV (BLADE) ×3 IMPLANT
BLADE SURG 15 STRL LF DISP TIS (BLADE) ×4 IMPLANT
BLADE SURG 15 STRL SS (BLADE) ×6
BNDG CMPR 9X4 STRL LF SNTH (GAUZE/BANDAGES/DRESSINGS) ×2
BNDG CMPR MD 5X2 ELC HKLP STRL (GAUZE/BANDAGES/DRESSINGS)
BNDG ELASTIC 2 VLCR STRL LF (GAUZE/BANDAGES/DRESSINGS) IMPLANT
BNDG ESMARK 4X9 LF (GAUZE/BANDAGES/DRESSINGS) ×3 IMPLANT
CHLORAPREP W/TINT 26ML (MISCELLANEOUS) ×3 IMPLANT
CLOTH BEACON ORANGE TIMEOUT ST (SAFETY) ×3 IMPLANT
CORDS BIPOLAR (ELECTRODE) ×3 IMPLANT
COTTONBALL LRG STERILE PKG (GAUZE/BANDAGES/DRESSINGS) IMPLANT
COVER MAYO STAND STRL (DRAPES) ×3 IMPLANT
COVER TABLE BACK 60X90 (DRAPES) ×3 IMPLANT
CUFF TOURNIQUET SINGLE 18IN (TOURNIQUET CUFF) ×3 IMPLANT
DECANTER SPIKE VIAL GLASS SM (MISCELLANEOUS) ×3 IMPLANT
DRAIN TLS ROUND 10FR (DRAIN) IMPLANT
DRAPE EXTREMITY T 121X128X90 (DRAPE) ×3 IMPLANT
DRAPE OEC MINIVIEW 54X84 (DRAPES) IMPLANT
DRAPE SURG 17X23 STRL (DRAPES) ×3 IMPLANT
DRSG PAD ABDOMINAL 8X10 ST (GAUZE/BANDAGES/DRESSINGS) ×3 IMPLANT
GAUZE SPONGE 4X4 16PLY XRAY LF (GAUZE/BANDAGES/DRESSINGS) IMPLANT
GAUZE XEROFORM 1X8 LF (GAUZE/BANDAGES/DRESSINGS) ×3 IMPLANT
GLOVE BIO SURGEON STRL SZ7.5 (GLOVE) ×6 IMPLANT
GLOVE BIOGEL M STRL SZ7.5 (GLOVE) ×3 IMPLANT
GLOVE BIOGEL PI IND STRL 8 (GLOVE) ×6 IMPLANT
GLOVE BIOGEL PI INDICATOR 8 (GLOVE) ×3
GLOVE SURG ORTHO 8.0 STRL STRW (GLOVE) IMPLANT
GOWN BRE IMP PREV XXLGXLNG (GOWN DISPOSABLE) ×6 IMPLANT
GOWN PREVENTION PLUS XLARGE (GOWN DISPOSABLE) IMPLANT
GOWN PREVENTION PLUS XXLARGE (GOWN DISPOSABLE) ×3 IMPLANT
GOWN STRL REIN XL XLG (GOWN DISPOSABLE) ×3 IMPLANT
KWIRE 4.0 X .035IN (WIRE) IMPLANT
LOOP VESSEL MAXI BLUE (MISCELLANEOUS) IMPLANT
NDL SAFETY ECLIPSE 18X1.5 (NEEDLE) IMPLANT
NEEDLE HYPO 18GX1.5 SHARP (NEEDLE)
NEEDLE HYPO 22GX1.5 SAFETY (NEEDLE) IMPLANT
NEEDLE HYPO 25X1 1.5 SAFETY (NEEDLE) ×3 IMPLANT
NEEDLE KEITH (NEEDLE) IMPLANT
NS IRRIG 1000ML POUR BTL (IV SOLUTION) ×3 IMPLANT
PACK BASIN DAY SURGERY FS (CUSTOM PROCEDURE TRAY) ×3 IMPLANT
PAD CAST 3X4 CTTN HI CHSV (CAST SUPPLIES) ×2 IMPLANT
PAD CAST 4YDX4 CTTN HI CHSV (CAST SUPPLIES) IMPLANT
PADDING CAST ABS 3INX4YD NS (CAST SUPPLIES)
PADDING CAST ABS 4INX4YD NS (CAST SUPPLIES) ×1
PADDING CAST ABS COTTON 3X4 (CAST SUPPLIES) IMPLANT
PADDING CAST ABS COTTON 4X4 ST (CAST SUPPLIES) ×2 IMPLANT
PADDING CAST COTTON 3X4 STRL (CAST SUPPLIES) ×3
PADDING CAST COTTON 4X4 STRL (CAST SUPPLIES)
SLEEVE SCD COMPRESS KNEE MED (MISCELLANEOUS) ×3 IMPLANT
SPEAR EYE SURG WECK-CEL (MISCELLANEOUS) IMPLANT
SPLINT PLASTER CAST XFAST 3X15 (CAST SUPPLIES) IMPLANT
SPLINT PLASTER CAST XFAST 4X15 (CAST SUPPLIES) IMPLANT
SPLINT PLASTER XTRA FAST SET 4 (CAST SUPPLIES)
SPLINT PLASTER XTRA FASTSET 3X (CAST SUPPLIES)
SPONGE GAUZE 4X4 12PLY (GAUZE/BANDAGES/DRESSINGS) ×3 IMPLANT
STOCKINETTE 4X48 STRL (DRAPES) ×3 IMPLANT
SUT CHROMIC 5 0 P 3 (SUTURE) IMPLANT
SUT ETHIBOND 3-0 V-5 (SUTURE) IMPLANT
SUT ETHILON 3 0 PS 1 (SUTURE) IMPLANT
SUT ETHILON 4 0 PS 2 18 (SUTURE) ×3 IMPLANT
SUT FIBERWIRE 3-0 18 TAPR NDL (SUTURE)
SUT FIBERWIRE 4-0 18 DIAM BLUE (SUTURE)
SUT FIBERWIRE 4-0 18 TAPR NDL (SUTURE)
SUT MERSILENE 2.0 SH NDLE (SUTURE) IMPLANT
SUT MERSILENE 3 0 FS 1 (SUTURE) IMPLANT
SUT MERSILENE 4 0 P 3 (SUTURE) IMPLANT
SUT MERSILENE 6 0 P 1 (SUTURE) IMPLANT
SUT NYLON 9 0 VRM6 (SUTURE) IMPLANT
SUT POLY BUTTON 15MM (SUTURE) IMPLANT
SUT PROLENE 2 0 SH DA (SUTURE) IMPLANT
SUT PROLENE 6 0 P 1 18 (SUTURE) IMPLANT
SUT SILK 2 0 FS (SUTURE) IMPLANT
SUT SILK 4 0 PS 2 (SUTURE) IMPLANT
SUT STEEL 4 0 V 26 (SUTURE) IMPLANT
SUT VIC AB 3-0 PS1 18 (SUTURE)
SUT VIC AB 3-0 PS1 18XBRD (SUTURE) IMPLANT
SUT VIC AB 4-0 P-3 18XBRD (SUTURE) IMPLANT
SUT VIC AB 4-0 P3 18 (SUTURE)
SUT VICRYL 4-0 PS2 18IN ABS (SUTURE) IMPLANT
SUTURE FIBERWR 3-0 18 TAPR NDL (SUTURE) IMPLANT
SUTURE FIBERWR 4-0 18 DIA BLUE (SUTURE) IMPLANT
SUTURE FIBERWR 4-0 18 TAPR NDL (SUTURE) IMPLANT
SYR BULB 3OZ (MISCELLANEOUS) ×3 IMPLANT
SYR CONTROL 10ML LL (SYRINGE) IMPLANT
TOWEL OR 17X24 6PK STRL BLUE (TOWEL DISPOSABLE) ×6 IMPLANT
TUBE FEEDING 5FR 15 INCH (TUBING) IMPLANT
UNDERPAD 30X30 INCONTINENT (UNDERPADS AND DIAPERS) ×3 IMPLANT
WATER STERILE IRR 1000ML POUR (IV SOLUTION) IMPLANT

## 2012-03-14 NOTE — Anesthesia Preprocedure Evaluation (Signed)

## 2012-03-14 NOTE — Transfer of Care (Signed)
Immediate Anesthesia Transfer of Care Note  Patient: Tara Bender  Procedure(s) Performed: Procedure(s) (LRB) with comments: WOUND EXPLORATION (Left) - Left Hand Repair Flexor Tendon, Possible Nerve/Artery Repair , debridement of tendon  Patient Location: PACU  Anesthesia Type:General  Level of Consciousness: awake  Airway & Oxygen Therapy: Patient Spontanous Breathing and Patient connected to face mask oxygen  Post-op Assessment: Report given to PACU RN and Post -op Vital signs reviewed and stable  Post vital signs: Reviewed and stable  Complications: No apparent anesthesia complications

## 2012-03-14 NOTE — Anesthesia Procedure Notes (Addendum)
Anesthesia Regional Block:  Supraclavicular block  Pre-Anesthetic Checklist: ,, timeout performed, Correct Patient, Correct Site, Correct Laterality, Correct Procedure, Correct Position, site marked, Risks and benefits discussed,  Surgical consent,  Pre-op evaluation,  At surgeon's request and post-op pain management  Laterality: Left and Upper  Prep: chloraprep       Needles:  Injection technique: Single-shot  Needle Type: Echogenic Needle     Needle Length: 5cm 5 cm Needle Gauge: 21    Additional Needles:  Procedures: ultrasound guided (picture in chart) Supraclavicular block Narrative:  Start time: 03/14/2012 11:20 AM End time: 03/14/2012 11:30 AM Injection made incrementally with aspirations every 5 mL.  Performed by: Personally  Anesthesiologist: Sheldon Silvan  Supraclavicular block Procedure Name: LMA Insertion Date/Time: 03/14/2012 12:02 PM Performed by: Verlan Friends Pre-anesthesia Checklist: Patient identified, Emergency Drugs available, Suction available, Patient being monitored and Timeout performed Patient Re-evaluated:Patient Re-evaluated prior to inductionOxygen Delivery Method: Circle System Utilized Preoxygenation: Pre-oxygenation with 100% oxygen Intubation Type: IV induction Ventilation: Mask ventilation without difficulty LMA: LMA inserted LMA Size: 4.0 Number of attempts: 1 Airway Equipment and Method: bite block Placement Confirmation: positive ETCO2 Tube secured with: Tape Dental Injury: Teeth and Oropharynx as per pre-operative assessment
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
## 2012-03-14 NOTE — Op Note (Signed)
Dictation [phone]

## 2012-03-14 NOTE — Progress Notes (Signed)
  Assisted Dr. Crews with left, ultrasound guided, supraclavicular block. Side rails up, monitors on throughout procedure. See vital signs in flow sheet. Tolerated Procedure well. 

## 2012-03-14 NOTE — Anesthesia Postprocedure Evaluation (Signed)
  Anesthesia Post-op Note  Patient: Tara Bender  Procedure(s) Performed: Procedure(s) (LRB) with comments: WOUND EXPLORATION (Left) - Left Hand Repair Flexor Tendon, Possible Nerve/Artery Repair , debridement of tendon  Patient Location: PACU  Anesthesia Type:GA combined with regional for post-op pain  Level of Consciousness: awake, alert  and oriented  Airway and Oxygen Therapy: Patient Spontanous Breathing and Patient connected to face mask oxygen  Post-op Pain: mild  Post-op Assessment: Post-op Vital signs reviewed  Post-op Vital Signs: Reviewed  Complications: No apparent anesthesia complications

## 2012-03-15 ENCOUNTER — Encounter (HOSPITAL_BASED_OUTPATIENT_CLINIC_OR_DEPARTMENT_OTHER): Payer: Self-pay | Admitting: Orthopedic Surgery

## 2012-03-15 ENCOUNTER — Emergency Department (HOSPITAL_COMMUNITY)
Admission: EM | Admit: 2012-03-15 | Discharge: 2012-03-15 | Disposition: A | Discharge status: Home / Self Care | Payer: No Typology Code available for payment source | Attending: Emergency Medicine | Admitting: Emergency Medicine

## 2012-03-15 DIAGNOSIS — J40 Bronchitis, not specified as acute or chronic: Secondary | ICD-10-CM | POA: Insufficient documentation

## 2012-03-15 DIAGNOSIS — S199XXA Unspecified injury of neck, initial encounter: Secondary | ICD-10-CM | POA: Insufficient documentation

## 2012-03-15 DIAGNOSIS — Z79899 Other long term (current) drug therapy: Secondary | ICD-10-CM | POA: Insufficient documentation

## 2012-03-15 DIAGNOSIS — E669 Obesity, unspecified: Secondary | ICD-10-CM | POA: Insufficient documentation

## 2012-03-15 DIAGNOSIS — Z87828 Personal history of other (healed) physical injury and trauma: Secondary | ICD-10-CM | POA: Insufficient documentation

## 2012-03-15 DIAGNOSIS — Y9389 Activity, other specified: Secondary | ICD-10-CM | POA: Insufficient documentation

## 2012-03-15 DIAGNOSIS — S0993XA Unspecified injury of face, initial encounter: Secondary | ICD-10-CM | POA: Insufficient documentation
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
## 2012-03-18 ENCOUNTER — Encounter (HOSPITAL_BASED_OUTPATIENT_CLINIC_OR_DEPARTMENT_OTHER): Payer: Self-pay

## 2012-03-19 ENCOUNTER — Ambulatory Visit: Payer: No Typology Code available for payment source | Attending: Orthopedic Surgery | Admitting: Occupational Therapy

## 2012-03-19 DIAGNOSIS — M25649 Stiffness of unspecified hand, not elsewhere classified: Secondary | ICD-10-CM | POA: Insufficient documentation

## 2012-03-19 DIAGNOSIS — IMO0001 Reserved for inherently not codable concepts without codable children: Secondary | ICD-10-CM | POA: Insufficient documentation

## 2012-03-19 DIAGNOSIS — M25549 Pain in joints of unspecified hand: Secondary | ICD-10-CM | POA: Insufficient documentation

## 2012-03-29 ENCOUNTER — Ambulatory Visit: Payer: Self-pay | Attending: Orthopedic Surgery | Admitting: Occupational Therapy

## 2012-03-29 ENCOUNTER — Encounter: Payer: Self-pay | Admitting: Occupational Therapy

## 2012-03-29 DIAGNOSIS — IMO0001 Reserved for inherently not codable concepts without codable children: Secondary | ICD-10-CM | POA: Insufficient documentation

## 2012-03-29 DIAGNOSIS — M25549 Pain in joints of unspecified hand: Secondary | ICD-10-CM | POA: Insufficient documentation

## 2012-03-29 DIAGNOSIS — M25649 Stiffness of unspecified hand, not elsewhere classified: Secondary | ICD-10-CM | POA: Insufficient documentation

## 2012-04-03 ENCOUNTER — Encounter: Payer: Self-pay | Admitting: Occupational Therapy

## 2012-04-09 ENCOUNTER — Encounter: Payer: Self-pay | Admitting: Occupational Therapy

## 2012-04-24 DIAGNOSIS — G039 Meningitis, unspecified: Secondary | ICD-10-CM

## 2012-04-24 HISTORY — DX: Meningitis, unspecified: G03.9

## 2012-07-16 ENCOUNTER — Encounter (HOSPITAL_COMMUNITY): Payer: Self-pay

## 2012-07-16 ENCOUNTER — Inpatient Hospital Stay (HOSPITAL_COMMUNITY)
Admission: AD | Admit: 2012-07-16 | Discharge: 2012-07-16 | Disposition: A | Discharge status: Home / Self Care | Payer: Self-pay | Source: Ambulatory Visit | Attending: Obstetrics and Gynecology | Admitting: Obstetrics and Gynecology

## 2012-07-16 ENCOUNTER — Inpatient Hospital Stay (HOSPITAL_COMMUNITY): Payer: Self-pay

## 2012-07-16 DIAGNOSIS — O26859 Spotting complicating pregnancy, unspecified trimester: Secondary | ICD-10-CM | POA: Insufficient documentation

## 2012-07-16 DIAGNOSIS — R109 Unspecified abdominal pain: Secondary | ICD-10-CM | POA: Insufficient documentation

## 2012-07-16 DIAGNOSIS — O99891 Other specified diseases and conditions complicating pregnancy: Secondary | ICD-10-CM | POA: Insufficient documentation

## 2012-07-16 DIAGNOSIS — O26851 Spotting complicating pregnancy, first trimester: Secondary | ICD-10-CM

## 2012-07-16 LAB — URINE MICROSCOPIC-ADD ON

## 2012-07-16 LAB — CBC
HCT: 35.1 % — ABNORMAL LOW (ref 36.0–46.0)
Hemoglobin: 11.1 g/dL — ABNORMAL LOW (ref 12.0–15.0)
RBC: 4.48 MIL/uL (ref 3.87–5.11)
WBC: 7.7 10*3/uL (ref 4.0–10.5)

## 2012-07-16 LAB — URINALYSIS, ROUTINE W REFLEX MICROSCOPIC
Glucose, UA: NEGATIVE mg/dL
Ketones, ur: 15 mg/dL — AB
Protein, ur: 30 mg/dL — AB
Urobilinogen, UA: 1 mg/dL (ref 0.0–1.0)

## 2012-07-16 LAB — TYPE AND SCREEN: Antibody Screen: NEGATIVE

## 2012-07-16 LAB — POCT PREGNANCY, URINE: Preg Test, Ur: POSITIVE — AB

## 2012-07-16 LAB — ABO/RH: ABO/RH(D): AB POS

## 2012-07-16 NOTE — MAU Note (Signed)
Patient had +UPT last Thursday thinks her LMP was 06/14/12, small amount of bleeding with small clots.

## 2012-07-16 NOTE — MAU Provider Note (Signed)
History  26yo, G2P1001 at [redacted]w[redacted]d presents with mild cramping for the last couple of days and spotting/clots starting this morning when she went to the bathroom.  Pt reports LMP 06/14/12 and a + home pregnancy test last Thursday.  No recent IC.  No other associated symptoms.  Chief Complaint  Patient presents with  . Vaginal Bleeding  . Abdominal Cramping    OB History   Grav Para Term Preterm Abortions TAB SAB Ect Mult Living   2 1 1       1       Past Medical History  Diagnosis Date  . Obesity   . Bronchitis   . Hand laceration     lt  . Medical history non-contributory     Past Surgical History  Procedure Laterality Date  . Wound exploration  03/14/2012    Procedure: WOUND EXPLORATION;  Surgeon: Tami Ribas, MD;  Location: [facility];  Service: Orthopedics;  Laterality: Left;  Left Hand Repair Flexor Tendon, Possible Nerve/Artery Repair , debridement of tendon    No family history on file.  History  Substance Use Topics  . Smoking status: Never Smoker   . Smokeless tobacco: Not on file  . Alcohol Use: No     Comment: social    Allergies: No Known Allergies  Prescriptions prior to admission  Medication Sig Dispense Refill  . albuterol (PROVENTIL HFA;VENTOLIN HFA) 108 (90 BASE) MCG/ACT inhaler Inhale 2 puffs into the lungs every 6 (six) hours as needed. For wheezing        Physical Exam   Blood pressure 131/58, pulse 89, temperature 98.8 F (37.1 C), temperature source Oral, resp. rate 18, height 5\' 3"  (1.6 m), weight 237 lb 3.2 oz (107.593 kg), last menstrual period 06/14/2012.  Results for orders placed during the hospital encounter of 07/16/12 (from the past 24 hour(s))  URINALYSIS, ROUTINE W REFLEX MICROSCOPIC     Status: Abnormal   Collection Time    07/16/12  7:25 AM      Result Value Range   Color, Urine YELLOW  YELLOW   APPearance CLOUDY (*) CLEAR   Specific Gravity, Urine 1.025  1.005 - 1.030   pH 6.5  5.0 - 8.0   Glucose, UA NEGATIVE   NEGATIVE mg/dL   Hgb urine dipstick LARGE (*) NEGATIVE   Bilirubin Urine NEGATIVE  NEGATIVE   Ketones, ur 15 (*) NEGATIVE mg/dL   Protein, ur 30 (*) NEGATIVE mg/dL   Urobilinogen, UA 1.0  0.0 - 1.0 mg/dL   Nitrite NEGATIVE  NEGATIVE   Leukocytes, UA LARGE (*) NEGATIVE  URINE MICROSCOPIC-ADD ON     Status: Abnormal   Collection Time    07/16/12  7:25 AM      Result Value Range   Squamous Epithelial / LPF MANY (*) RARE   WBC, UA 21-50  <3 WBC/hpf   RBC / HPF 7-10  <3 RBC/hpf   Bacteria, UA MANY (*) RARE   Urine-Other TRICHOMONAS PRESENT    POCT PREGNANCY, URINE     Status: Abnormal   Collection Time    07/16/12  7:31 AM      Result Value Range   Preg Test, Ur POSITIVE (*) NEGATIVE  HCG, QUANTITATIVE, PREGNANCY     Status: Abnormal   Collection Time    07/16/12  8:21 AM      Result Value Range   hCG, Beta Chain, Quant, S 2076 (*) <5 mIU/mL  CBC     Status: Abnormal  Collection Time    07/16/12  8:51 AM      Result Value Range   WBC 7.7  4.0 - 10.5 K/uL   RBC 4.48  3.87 - 5.11 MIL/uL   Hemoglobin 11.1 (*) 12.0 - 15.0 g/dL   HCT 16.1 (*) 09.6 - 04.5 %   MCV 78.3  78.0 - 100.0 fL   MCH 24.8 (*) 26.0 - 34.0 pg   MCHC 31.6  30.0 - 36.0 g/dL   RDW 40.9  81.1 - 91.4 %   Platelets 283  150 - 400 K/uL  TYPE AND SCREEN     Status: None   Collection Time    07/16/12  9:00 AM      Result Value Range   ABO/RH(D) AB POS     Antibody Screen NEG     Sample Expiration 07/19/2012     Chest: Clear Heart: RRR without murmur Pelvic: No VB noted; Cx closed thick Extremities: WNL   ED Course  C/w Dr. Roque Lias = 2076 Rh status - AB Pos CBC - WNL U/S - 5w gestational sac noted; No adnexal masses identified.  Re-draw Quant in 48 hours  Schedule NOB interview in the next couple of weeks Bleeding precautions rv'd Discharge home    Haroldine Laws CNM, MSN 07/16/2012 10:24 AM

## 2012-07-18 ENCOUNTER — Inpatient Hospital Stay (HOSPITAL_COMMUNITY)
Admission: AD | Admit: 2012-07-18 | Discharge: 2012-07-18 | Disposition: A | Discharge status: Home / Self Care | Payer: Self-pay | Source: Ambulatory Visit | Attending: Obstetrics and Gynecology | Admitting: Obstetrics and Gynecology

## 2012-07-18 DIAGNOSIS — O99891 Other specified diseases and conditions complicating pregnancy: Secondary | ICD-10-CM | POA: Insufficient documentation

## 2012-07-18 LAB — HCG, QUANTITATIVE, PREGNANCY: hCG, Beta Chain, Quant, S: 4222 m[IU]/mL — ABNORMAL HIGH (ref <5)

## 2012-07-18 LAB — URINE CULTURE

## 2012-07-18 NOTE — MAU Note (Signed)
Pt here for repeat QUANT

## 2012-07-18 NOTE — MAU Note (Signed)
H.Steelman,CNM notified that pt is here for a repeat Quant-sttes she will put the order in

## 2012-08-15 ENCOUNTER — Other Ambulatory Visit: Payer: Self-pay

## 2012-08-15 ENCOUNTER — Encounter (HOSPITAL_COMMUNITY): Payer: Self-pay | Admitting: *Deleted

## 2012-08-16 ENCOUNTER — Encounter (HOSPITAL_COMMUNITY): Payer: Self-pay | Admitting: Anesthesiology

## 2012-08-16 ENCOUNTER — Ambulatory Visit (HOSPITAL_COMMUNITY): Payer: Managed Care, Other (non HMO) | Admitting: Anesthesiology

## 2012-08-16 ENCOUNTER — Ambulatory Visit (HOSPITAL_COMMUNITY)
Admission: RE | Admit: 2012-08-16 | Discharge: 2012-08-16 | Disposition: A | Discharge status: Home / Self Care | Payer: Managed Care, Other (non HMO) | Source: Ambulatory Visit | Attending: Obstetrics and Gynecology | Admitting: Obstetrics and Gynecology

## 2012-08-16 ENCOUNTER — Encounter (HOSPITAL_COMMUNITY): Payer: Self-pay | Admitting: *Deleted

## 2012-08-16 ENCOUNTER — Encounter (HOSPITAL_COMMUNITY)
Admission: RE | Disposition: A | Discharge status: Home / Self Care | Payer: Self-pay | Source: Ambulatory Visit | Attending: Obstetrics and Gynecology

## 2012-08-16 DIAGNOSIS — O021 Missed abortion: Secondary | ICD-10-CM | POA: Insufficient documentation

## 2012-08-16 HISTORY — PX: DILATION AND EVACUATION: SHX1459

## 2012-08-16 LAB — CBC
HCT: 37.3 % (ref 36.0–46.0)
Hemoglobin: 11.7 g/dL — ABNORMAL LOW (ref 12.0–15.0)
MCHC: 31.4 g/dL (ref 30.0–36.0)
RBC: 4.8 MIL/uL (ref 3.87–5.11)
WBC: 7.1 10*3/uL (ref 4.0–10.5)

## 2012-08-16 SURGERY — DILATION AND EVACUATION, UTERUS
Anesthesia: Monitor Anesthesia Care | Site: Vagina | Wound class: Clean Contaminated

## 2012-08-16 MED ORDER — LIDOCAINE HCL 2 % IJ SOLN
INTRAMUSCULAR | Status: AC
  Filled 2012-08-16: qty 20

## 2012-08-16 MED ORDER — METRONIDAZOLE 500 MG PO TABS: 500.0000 mg | ORAL_TABLET | Freq: Two times a day (BID) | ORAL | Status: DC

## 2012-08-16 MED ORDER — FENTANYL CITRATE 0.05 MG/ML IJ SOLN
INTRAMUSCULAR | Status: DC | PRN
  Administered 2012-08-16 (×4): 50 ug via INTRAVENOUS

## 2012-08-16 MED ORDER — SCOPOLAMINE 1 MG/3DAYS TD PT72
1.0000 | MEDICATED_PATCH | TRANSDERMAL | Status: DC
  Administered 2012-08-16: 1.5 mg via TRANSDERMAL

## 2012-08-16 MED ORDER — LIDOCAINE HCL (CARDIAC) 20 MG/ML IV SOLN
INTRAVENOUS | Status: DC | PRN
  Administered 2012-08-16: 30 mg via INTRAVENOUS
  Administered 2012-08-16: 50 mg via INTRAVENOUS

## 2012-08-16 MED ORDER — DOXYCYCLINE HYCLATE 100 MG IV SOLR
100.0000 mg | Freq: Once | INTRAVENOUS | Status: AC
  Administered 2012-08-16: 100 mg via INTRAVENOUS
  Filled 2012-08-16: qty 100

## 2012-08-16 MED ORDER — HYDROCODONE-ACETAMINOPHEN 5-325 MG PO TABS: 1.0000 | ORAL_TABLET | Freq: Four times a day (QID) | ORAL | Status: DC | PRN

## 2012-08-16 MED ORDER — MIDAZOLAM HCL 2 MG/2ML IJ SOLN
INTRAMUSCULAR | Status: AC
  Filled 2012-08-16: qty 2

## 2012-08-16 MED ORDER — KETOROLAC TROMETHAMINE 30 MG/ML IJ SOLN
INTRAMUSCULAR | Status: AC
  Filled 2012-08-16: qty 1

## 2012-08-16 MED ORDER — MIDAZOLAM HCL 5 MG/5ML IJ SOLN
INTRAMUSCULAR | Status: DC | PRN
  Administered 2012-08-16: 2 mg via INTRAVENOUS

## 2012-08-16 MED ORDER — ONDANSETRON HCL 4 MG/2ML IJ SOLN
INTRAMUSCULAR | Status: DC | PRN
  Administered 2012-08-16: 4 mg via INTRAVENOUS

## 2012-08-16 MED ORDER — FENTANYL CITRATE 0.05 MG/ML IJ SOLN: 25.0000 ug | INTRAMUSCULAR | Status: DC | PRN

## 2012-08-16 MED ORDER — LIDOCAINE HCL (CARDIAC) 20 MG/ML IV SOLN
INTRAVENOUS | Status: AC
  Filled 2012-08-16: qty 5

## 2012-08-16 MED ORDER — KETOROLAC TROMETHAMINE 30 MG/ML IJ SOLN
15.0000 mg | Freq: Once | INTRAMUSCULAR | Status: AC | PRN
  Administered 2012-08-16: 30 mg via INTRAVENOUS

## 2012-08-16 MED ORDER — IBUPROFEN 600 MG PO TABS: 600.0000 mg | ORAL_TABLET | Freq: Four times a day (QID) | ORAL | Status: DC | PRN

## 2012-08-16 MED ORDER — METRONIDAZOLE IN NACL 5-0.79 MG/ML-% IV SOLN
500.0000 mg | Freq: Once | INTRAVENOUS | Status: AC
  Administered 2012-08-16: .5 g via INTRAVENOUS
  Filled 2012-08-16: qty 100

## 2012-08-16 MED ORDER — PROPOFOL 10 MG/ML IV EMUL
INTRAVENOUS | Status: AC
  Filled 2012-08-16: qty 20

## 2012-08-16 MED ORDER — LIDOCAINE HCL 2 % IJ SOLN
INTRAMUSCULAR | Status: DC | PRN
  Administered 2012-08-16: 10 mL

## 2012-08-16 MED ORDER — DEXAMETHASONE SODIUM PHOSPHATE 4 MG/ML IJ SOLN
INTRAMUSCULAR | Status: DC | PRN
  Administered 2012-08-16: 10 mg via INTRAVENOUS

## 2012-08-16 MED ORDER — ONDANSETRON HCL 4 MG/2ML IJ SOLN
INTRAMUSCULAR | Status: AC
  Filled 2012-08-16: qty 2

## 2012-08-16 MED ORDER — FENTANYL CITRATE 0.05 MG/ML IJ SOLN
INTRAMUSCULAR | Status: AC
  Filled 2012-08-16: qty 2

## 2012-08-16 MED ORDER — PROPOFOL 10 MG/ML IV EMUL
INTRAVENOUS | Status: DC | PRN
  Administered 2012-08-16: 30 mg via INTRAVENOUS
  Administered 2012-08-16: 50 mg via INTRAVENOUS
  Administered 2012-08-16 (×3): 30 mg via INTRAVENOUS

## 2012-08-16 MED ORDER — DOXYCYCLINE HYCLATE 100 MG PO TABS: 100.0000 mg | ORAL_TABLET | Freq: Two times a day (BID) | ORAL | Status: DC

## 2012-08-16 MED ORDER — FENTANYL CITRATE 0.05 MG/ML IJ SOLN
INTRAMUSCULAR | Status: AC
Start: 2012-08-16 — End: 2012-08-16
  Filled 2012-08-16: qty 2

## 2012-08-16 MED ORDER — LACTATED RINGERS IV SOLN
INTRAVENOUS | Status: DC
  Administered 2012-08-16: 09:00:00 via INTRAVENOUS

## 2012-08-16 SURGICAL SUPPLY — 21 items
CATH ROBINSON RED A/P 16FR (CATHETERS) ×2 IMPLANT
CLOTH BEACON ORANGE TIMEOUT ST (SAFETY) ×2 IMPLANT
DECANTER SPIKE VIAL GLASS SM (MISCELLANEOUS) ×2 IMPLANT
GLOVE BIO SURGEON STRL SZ7.5 (GLOVE) ×2 IMPLANT
GLOVE BIOGEL PI IND STRL 7.5 (GLOVE) ×1 IMPLANT
GLOVE BIOGEL PI INDICATOR 7.5 (GLOVE) ×1
GOWN STRL REIN XL XLG (GOWN DISPOSABLE) ×4 IMPLANT
KIT BERKELEY 1ST TRIMESTER 3/8 (MISCELLANEOUS) ×2 IMPLANT
NEEDLE SPNL 22GX3.5 QUINCKE BK (NEEDLE) ×2 IMPLANT
NS IRRIG 1000ML POUR BTL (IV SOLUTION) ×2 IMPLANT
PACK VAGINAL MINOR WOMEN LF (CUSTOM PROCEDURE TRAY) ×2 IMPLANT
PAD OB MATERNITY 4.3X12.25 (PERSONAL CARE ITEMS) ×2 IMPLANT
PAD PREP 24X48 CUFFED NSTRL (MISCELLANEOUS) ×2 IMPLANT
SET BERKELEY SUCTION TUBING (SUCTIONS) ×2 IMPLANT
SYR CONTROL 10ML LL (SYRINGE) ×2 IMPLANT
TOWEL OR 17X24 6PK STRL BLUE (TOWEL DISPOSABLE) ×4 IMPLANT
VACURETTE 10 RIGID CVD (CANNULA) IMPLANT
VACURETTE 7MM CVD STRL WRAP (CANNULA) IMPLANT
VACURETTE 8 RIGID CVD (CANNULA) ×1 IMPLANT
VACURETTE 9 RIGID CVD (CANNULA) IMPLANT
WATER STERILE IRR 1000ML POUR (IV SOLUTION) ×2 IMPLANT

## 2012-08-16 NOTE — H&P (Signed)
Tara Bender is a 27 y.o. female presenting for Suction D&C secondary to a missed ab.  An ultrasound 2wks showed a viable IUP.  The pt has had persistent spotting and no cramping for most of the pregnancy.  She reports episode of heavier bleeding a few days ago and presented to office and had u/s showing missed ab with no fetal heart beat.  She opted for [facility] which has been scheduled for this morning.  She tested positive for chlamydia at that time and yesterday was informed of the positive results and zithromax ordered which the patient has not taken.  I discussed increased risk with doing the procedure today with positive chlamydia which included but not limited to serious infection, PID, endometritis versus postponing procedure until after treatment.  The patient wanted to proceed as scheduled and continue antibiotics postop.  History OB History   Grav Para Term Preterm Abortions TAB SAB Ect Mult Living   2 1 1       1      Past Medical History  Diagnosis Date  . Obesity   . Bronchitis   . Hand laceration     lt  . Medical history non-contributory   . SVD (spontaneous vaginal delivery)     x 1  . Bronchitis     uses inhaler prn - rarely uses   Past Surgical History  Procedure Laterality Date  . Wound exploration  03/14/2012    Procedure: WOUND EXPLORATION;  Surgeon: Tami Ribas, MD;  Location: [facility];  Service: Orthopedics;  Laterality: Left;  Left Hand Repair Flexor Tendon, Possible Nerve/Artery Repair , debridement of tendon  . Left hand surgery      tendon repair   Meds none Allergies NKDA Family History: family history is not on file. Social History:  reports that she has never smoked. She does not have any smokeless tobacco history on file. She reports that  drinks alcohol. She reports that she does not use illicit drugs. She lives with her son.  Prenatal Transfer Tool  N/a  ROS Denies fever, chills, nausea, vomiting or abdominal pain.   Pulse  82, temperature 99 F (37.2 C), temperature source Oral, height 5\' 3"  (1.6 m), weight 229 lb (103.874 kg), last menstrual period 06/14/2012, SpO2 100.00%, not currently breastfeeding. Exam Physical Exam  Lungs CTA bil CV RRR Abd soft, NT Ext nt, no s/sxs of DVT VE deferred to OR  Prenatal labs: ABO, Rh: --/--/AB POS, AB POS (03/25 0900) Antibody: NEG (03/25 0900)  Assessment/Plan: 26yo G2P1 at 9wks with missed ab meas about 6wks scheduled to undergo suction D&C.  The risks, benefits and alternatives were discussed with patient including but not limited to bleeding, infection and injury and she would like to proceed.  Will premedicate with doxy and flagyl and continue that postop.  The pt was additional instructed to schedule appt to f/u in office in approx 1wk and precautions discussed such as bleeding and infection.   Ankit Degregorio Y 08/16/2012, 8:55 AM

## 2012-08-16 NOTE — Op Note (Signed)
Preop Diagnosis: MISSED ABORTION  Postop Diagnosis: MISSED ABORTION   Procedure: DILATATION AND EVACUATION   Anesthesia: MAC  Attending: Purcell Nails, MD   Assistant: N/a  Findings: Mod POCs  Pathology: POCs  Fluids: 900cc  UOP: 100cc via straight cath after procedure.  Pt voided prior to procedure.  EBL: Minimal  Complications: None  Procedure: The patient was taken to the operating room after the risks benefits and alternatives were discussed with the patient, the patient verbalized understanding and consent signed and witnessed.  The patient was placed under MAC anesthesia, prepped and draped in the normal sterile fashion and a time out was performed.  A bivalve speculum was placed in the patient's vagina and the anterior lip of the cervix grasped with a single-tooth tenaculum. A paracervical block was administered using a total of 10 cc of 1% lidocaine.  The uterus was sounded to 11 cm and a size 8 suction curette was used. Suction curettage was performed until minimal tissue returned. Sharp curettage was performed until a gritty texture was noted. Suction curettage was performed once again to remove any remaining debris. All instruments were removed. The count was correct. The patient was transferred to the recovery room in good condition.

## 2012-08-16 NOTE — Transfer of Care (Signed)
Immediate Anesthesia Transfer of Care Note  Patient: Tara Bender  Procedure(s) Performed: Procedure(s): DILATATION AND EVACUATION (N/A)  Patient Location: PACU  Anesthesia Type:General  Level of Consciousness: sedated  Airway & Oxygen Therapy: Patient Spontanous Breathing and Patient connected to nasal cannula oxygen  Post-op Assessment: Report given to PACU RN and Post -op Vital signs reviewed and stable  Post vital signs: Reviewed and stable  Complications: No apparent anesthesia complications

## 2012-08-16 NOTE — Anesthesia Preprocedure Evaluation (Addendum)
Anesthesia Evaluation  Patient identified by MRN, date of birth, ID band Patient awake    Reviewed: Allergy & Precautions, H&P , NPO status , Patient's Chart, lab work & pertinent test results, reviewed documented beta blocker date and time   History of Anesthesia Complications Negative for: history of anesthetic complications  Airway Mallampati: III TM Distance: >3 FB Neck ROM: full    Dental  (+) Teeth Intact   Pulmonary neg pulmonary ROS,  breath sounds clear to auscultation        Cardiovascular negative cardio ROS  Rhythm:regular Rate:Normal     Neuro/Psych negative neurological ROS  negative psych ROS   GI/Hepatic negative GI ROS, Neg liver ROS,   Endo/Other  Morbid obesity  Renal/GU negative Renal ROS     Musculoskeletal   Abdominal   Peds  Hematology negative hematology ROS (+)   Anesthesia Other Findings   Reproductive/Obstetrics (+) Pregnancy (9 week missed ab)                          Anesthesia Physical Anesthesia Plan  ASA: III  Anesthesia Plan: MAC   Post-op Pain Management:    Induction:   Airway Management Planned:   Additional Equipment:   Intra-op Plan:   Post-operative Plan:   Informed Consent: I have reviewed the patients History and Physical, chart, labs and discussed the procedure including the risks, benefits and alternatives for the proposed anesthesia with the patient or authorized representative who has indicated his/her understanding and acceptance.   Dental Advisory Given  Plan Discussed with: Surgeon and CRNA  Anesthesia Plan Comments:         Anesthesia Quick Evaluation

## 2012-08-16 NOTE — Anesthesia Postprocedure Evaluation (Signed)
  Anesthesia Post-op Note  Anesthesia Post Note  Patient: Tara Bender  Procedure(s) Performed: Procedure(s) (LRB): DILATATION AND EVACUATION (N/A)  Anesthesia type: MAC  Patient location: PACU  Post pain: Pain level controlled  Post assessment: Post-op Vital signs reviewed  Last Vitals:  Filed Vitals:   08/16/12 1130  BP: 113/66  Pulse: 74  Temp: 37.1 C  Resp: 22    Post vital signs: Reviewed  Level of consciousness: sedated  Complications: No apparent anesthesia complications

## 2012-08-20 ENCOUNTER — Encounter (HOSPITAL_COMMUNITY): Payer: Self-pay | Admitting: Obstetrics and Gynecology

## 2012-09-30 ENCOUNTER — Emergency Department (HOSPITAL_COMMUNITY)
Admission: EM | Admit: 2012-09-30 | Discharge: 2012-09-30 | Disposition: A | Discharge status: Home / Self Care | Payer: Managed Care, Other (non HMO) | Attending: Emergency Medicine | Admitting: Emergency Medicine

## 2012-09-30 ENCOUNTER — Emergency Department (HOSPITAL_COMMUNITY): Payer: Managed Care, Other (non HMO)

## 2012-09-30 ENCOUNTER — Encounter (HOSPITAL_COMMUNITY): Payer: Self-pay | Admitting: Emergency Medicine

## 2012-09-30 DIAGNOSIS — Z87828 Personal history of other (healed) physical injury and trauma: Secondary | ICD-10-CM | POA: Insufficient documentation

## 2012-09-30 DIAGNOSIS — M25532 Pain in left wrist: Secondary | ICD-10-CM

## 2012-09-30 DIAGNOSIS — R209 Unspecified disturbances of skin sensation: Secondary | ICD-10-CM | POA: Insufficient documentation

## 2012-09-30 DIAGNOSIS — R52 Pain, unspecified: Secondary | ICD-10-CM | POA: Insufficient documentation

## 2012-09-30 DIAGNOSIS — E669 Obesity, unspecified: Secondary | ICD-10-CM | POA: Insufficient documentation

## 2012-09-30 DIAGNOSIS — Z8709 Personal history of other diseases of the respiratory system: Secondary | ICD-10-CM | POA: Insufficient documentation

## 2012-09-30 DIAGNOSIS — M25539 Pain in unspecified wrist: Secondary | ICD-10-CM | POA: Insufficient documentation

## 2012-09-30 DIAGNOSIS — IMO0001 Reserved for inherently not codable concepts without codable children: Secondary | ICD-10-CM | POA: Insufficient documentation

## 2012-09-30 DIAGNOSIS — Z789 Other specified health status: Secondary | ICD-10-CM | POA: Insufficient documentation

## 2012-09-30 MED ORDER — NAPROXEN 500 MG PO TABS: 500.0000 mg | ORAL_TABLET | Freq: Two times a day (BID) | ORAL | Status: DC

## 2012-09-30 NOTE — ED Provider Notes (Signed)
History    This chart was scribed for Tara Bender, a non-physician practitioner working with Tara Razor, MD by [name], ED Scribe. This patient was seen in room WTR8/WTR8 and the patient's care was started at 2015.     CSN: [id number]  Arrival date & time 09/30/12  1813   First MD Initiated Contact with Patient 09/30/12 2011      Chief Complaint  Patient presents with  . Wrist Pain    (Consider location/radiation/quality/duration/timing/severity/associated sxs/prior treatment) The history is provided by the patient.  HPI Comments: Tara Bender is a 27 y.o. female who presents to the Emergency Department complaining of constant moderate left thumb numbness and radial wrist pain onset 5 days. 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 Reports associated mild non-radiating left wrist pain. Denies any aggravating or alleviating factors. Denies associated recent injury, fever, and weakness. Denies taking any OTC medications to relieve symptoms. Reports hx of left hand surgery November 2013. Reports she has an appointment with Dr. [name] hand surgeon next week.  Past Medical History  Diagnosis Date  . Obesity   . Bronchitis   . Hand laceration     lt  . Medical history non-contributory   . SVD (spontaneous vaginal delivery)     x 1  . Bronchitis     uses inhaler prn - rarely uses    Past Surgical History  Procedure Laterality Date  . Wound exploration  03/14/2012    Procedure: WOUND EXPLORATION;  Surgeon: Tami Ribas, MD;  Location: [facility];  Service: Orthopedics;  Laterality: Left;  Left Hand Repair Flexor Tendon, Possible Nerve/Artery Repair , debridement of tendon  . Left hand surgery      tendon repair  . Dilation and evacuation N/A 08/16/2012    Procedure: DILATATION AND EVACUATION;  Surgeon: Purcell Nails, MD;  Location: [facility];  Service: Gynecology;  Laterality: N/A;    No family history on file.  History   Substance Use Topics  . Smoking status: Never Smoker   . Smokeless tobacco: Not on file  . Alcohol Use: Yes     Comment: socially    OB History   Grav Para Term Preterm Abortions TAB SAB Ect Mult Living   2 1 1       1       Review of Systems  Constitutional: Negative for fever.  Musculoskeletal: Positive for myalgias (left wrist pain).  Skin: Negative for wound.  Neurological: Positive for numbness.  Psychiatric/Behavioral: Negative for confusion.    Allergies  Review of patient's allergies indicates no known allergies.  Home Medications  No current outpatient prescriptions on file.  BP 114/66  Pulse 88  Temp(Src) 99.3 F (37.4 C) (Oral)  Resp 18  SpO2 96%  LMP 09/20/2012  Breastfeeding? Unknown  Physical Exam  Nursing note and vitals reviewed. Constitutional: She is oriented to person, place, and time. She appears well-developed and well-nourished. No distress.  HENT:  Head: Normocephalic and atraumatic.  Eyes: EOM are normal.  Neck: Neck supple. No tracheal deviation present.  Cardiovascular: Normal rate, regular rhythm, normal heart sounds and intact distal pulses.   No murmur heard. Pulmonary/Chest: Effort normal and breath sounds normal. No respiratory distress. She has no wheezes. She has no rales.  Musculoskeletal: Normal range of motion. She exhibits no tenderness.       Left wrist: She exhibits normal range of motion, no tenderness, no bony tenderness, no swelling and no  deformity.       Left hand: Normal. She exhibits normal range of motion, no tenderness, no bony tenderness, normal capillary refill, no deformity, no laceration and no swelling. Normal sensation noted. Decreased sensation is not present in the ulnar distribution, is not present in the medial redistribution and is not present in the radial distribution.  Neurological: She is alert and oriented to person, place, and time. She has normal strength. No sensory deficit.  Negative finkelstein  test   Skin: Skin is warm and dry. No erythema.  Psychiatric: She has a normal mood and affect. Her behavior is normal.    ED Course  Procedures (including critical care time) Medications - No data to display  Labs Reviewed - No data to display Dg Wrist Complete Left  09/30/2012   *RADIOLOGY REPORT*  Clinical Data: Left wrist pain.  LEFT WRIST - COMPLETE 3+ VIEW  Comparison:  None.  Findings:  There is no evidence of fracture or dislocation.  There is no evidence of arthropathy or other focal bone abnormality. Soft tissues are unremarkable.  IMPRESSION: Negative.   Original Report Authenticated By: Irish Lack, M.D.     No diagnosis found.    MDM  Patient presenting with pain of the left wrist and numbness of the left thumb.  No signs of infection.  No fever or chills.  Xray ordered in triage negative. Neurovascularly intact.  Patient given wrist splint and Naproxen.  She has an appointment scheduled with her Hand Surgeon next week.  I personally performed the services described in this documentation, which was scribed in my presence. The recorded information has been reviewed and is accurate.    Pascal Lux [place], PA-C 10/01/12 [phone]

## 2012-09-30 NOTE — ED Notes (Signed)
Pt presenting to ed with c/o left wrist pain x 4 days pt states she has had surgery on her left hand in the past

## 2012-10-03 NOTE — ED Provider Notes (Signed)
Medical screening examination/treatment/procedure(s) were performed by non-physician practitioner and as supervising physician I was immediately available for consultation/collaboration.  [name], MD 10/03/12 [phone]

## 2012-10-19 ENCOUNTER — Encounter (HOSPITAL_COMMUNITY): Payer: Self-pay | Admitting: *Deleted

## 2012-10-19 ENCOUNTER — Emergency Department (HOSPITAL_COMMUNITY)
Admission: EM | Admit: 2012-10-19 | Discharge: 2012-10-19 | Disposition: A | Discharge status: Home / Self Care | Payer: Managed Care, Other (non HMO) | Attending: Emergency Medicine | Admitting: Emergency Medicine

## 2012-10-19 DIAGNOSIS — E669 Obesity, unspecified: Secondary | ICD-10-CM | POA: Insufficient documentation

## 2012-10-19 DIAGNOSIS — R109 Unspecified abdominal pain: Secondary | ICD-10-CM | POA: Insufficient documentation

## 2012-10-19 DIAGNOSIS — J029 Acute pharyngitis, unspecified: Secondary | ICD-10-CM

## 2012-10-19 DIAGNOSIS — R51 Headache: Secondary | ICD-10-CM | POA: Insufficient documentation

## 2012-10-19 DIAGNOSIS — Z8709 Personal history of other diseases of the respiratory system: Secondary | ICD-10-CM | POA: Insufficient documentation

## 2012-10-19 LAB — RAPID STREP SCREEN (MED CTR MEBANE ONLY): Streptococcus, Group A Screen (Direct): NEGATIVE

## 2012-10-19 MED ORDER — HYDROCODONE-ACETAMINOPHEN 7.5-325 MG/15ML PO SOLN: 15.0000 mL | Freq: Four times a day (QID) | ORAL | Status: DC | PRN

## 2012-10-19 NOTE — ED Notes (Signed)
Pt states the rt side of throat started to swell this am, throat has become sore, now rt side is starting to swell, slight headache with symptoms

## 2012-10-19 NOTE — ED Provider Notes (Signed)
History  This chart was scribed for non-physician practitioner working with Gavin Pound. Oletta Lamas, MD by Greggory Stallion, ED scribe. This patient was seen in room WTR9/WTR9 and the patient's care was started at 9:31 PM.  CSN: [id number] Arrival date & time 10/19/12  1948   Chief Complaint  Patient presents with  . Oral Swelling   The history is provided by the patient. No language interpreter was used.    HPI Comments: Tara Bender is a 27 y.o. female who presents to the Emergency Department complaining of oral swelling that started this morning. She states it hurts when she swallows or eats. She states she has a slight HA and some abdominal pain. Pt denies nausea, emesis, and diarrhea as associated symptoms. Pt states she took Vicodin earlier with no relief. No trouble swallowing her spit, cp, sob, neck stiffness or rash.  Past Medical History  Diagnosis Date  . Obesity   . Bronchitis   . Hand laceration     lt  . Medical history non-contributory   . SVD (spontaneous vaginal delivery)     x 1  . Bronchitis     uses inhaler prn - rarely uses   Past Surgical History  Procedure Laterality Date  . Wound exploration  03/14/2012    Procedure: WOUND EXPLORATION;  Surgeon: Tami Ribas, MD;  Location: [facility];  Service: Orthopedics;  Laterality: Left;  Left Hand Repair Flexor Tendon, Possible Nerve/Artery Repair , debridement of tendon  . Left hand surgery      tendon repair  . Dilation and evacuation N/A 08/16/2012    Procedure: DILATATION AND EVACUATION;  Surgeon: Purcell Nails, MD;  Location: [facility];  Service: Gynecology;  Laterality: N/A;   No family history on file. History  Substance Use Topics  . Smoking status: Never Smoker   . Smokeless tobacco: Not on file  . Alcohol Use: Yes     Comment: socially   OB History   Grav Para Term Preterm Abortions TAB SAB Ect Mult Living   2 1 1       1      Review of Systems  HENT: Positive for sore throat.         Oral swelling  All other systems reviewed and are negative.    Allergies  Review of patient's allergies indicates no known allergies.  Home Medications   Current Outpatient Rx  Name  Route  Sig  Dispense  Refill  . naproxen (NAPROSYN) 500 MG tablet   Oral   Take 1 tablet (500 mg total) by mouth 2 (two) times daily.   30 tablet   0    BP 118/78  Pulse 108  Temp(Src) 100 F (37.8 C) (Oral)  Resp 20  Wt 227 lb 6.4 oz (103.148 kg)  BMI 40.29 kg/m2  SpO2 98%  LMP 10/18/2012  Physical Exam  Nursing note and vitals reviewed. Constitutional: She is oriented to person, place, and time. She appears well-developed and well-nourished. No distress.  HENT:  Head: Normocephalic and atraumatic.  Right Ear: External ear normal.  Left Ear: External ear normal.  Mouth/Throat: No posterior oropharyngeal edema.  No tonsillar enlargement or exudate. No vidence of deep tissue infection. No trismus.   Eyes: EOM are normal.  Neck: Neck supple. No tracheal deviation present.  Trachea is midline. No tracheal deviation. No thyroid enlargement.  Tenderness to left anterior neck on palpation.  No abscess or rash.    Able to swallow own saliva,  no trismus.  Tolerates PO  Cardiovascular: Normal rate.   Pulmonary/Chest: Effort normal. No respiratory distress.  Musculoskeletal: Normal range of motion.  Neurological: She is alert and oriented to person, place, and time.  Skin: Skin is warm and dry.  Psychiatric: She has a normal mood and affect. Her behavior is normal.    ED Course  Procedures (including critical care time)  DIAGNOSTIC STUDIES: Oxygen Saturation is 98% on RA, normal by my interpretation.    COORDINATION OF CARE: 9:41 PM-Discussed treatment plan which includes test for strep throat with pt at bedside and pt agreed to plan.   10:49 PM Strep neg.  Able to swallow own saliva, no trismus.  Tolerates PO.    Labs Reviewed - No data to display No results found. 1. Pharyngitis      MDM  BP 118/78  Pulse 108  Temp(Src) 100 F (37.8 C) (Oral)  Resp 20  Wt 227 lb 6.4 oz (103.148 kg)  BMI 40.29 kg/m2  SpO2 98%  LMP 10/18/2012  I have reviewed nursing notes and vital signs.  I reviewed available ER/hospitalization records thought the EMR   I personally performed the services described in this documentation, which was scribed in my presence. The recorded information has been reviewed and is accurate.    Fayrene Helper, PA-C 10/19/12 2250

## 2012-10-20 NOTE — ED Provider Notes (Signed)
Medical screening examination/treatment/procedure(s) were performed by non-physician practitioner and as supervising physician I was immediately available for consultation/collaboration.   Gavin Pound. Jeannia Tatro, MD 10/20/12 0000

## 2012-10-21 ENCOUNTER — Emergency Department (HOSPITAL_COMMUNITY)
Admission: EM | Admit: 2012-10-21 | Discharge: 2012-10-21 | Disposition: A | Discharge status: Home / Self Care | Payer: Managed Care, Other (non HMO) | Attending: Emergency Medicine | Admitting: Emergency Medicine

## 2012-10-21 ENCOUNTER — Encounter (HOSPITAL_COMMUNITY): Payer: Self-pay | Admitting: *Deleted

## 2012-10-21 ENCOUNTER — Emergency Department (HOSPITAL_COMMUNITY): Payer: Managed Care, Other (non HMO)

## 2012-10-21 DIAGNOSIS — Z3202 Encounter for pregnancy test, result negative: Secondary | ICD-10-CM | POA: Insufficient documentation

## 2012-10-21 DIAGNOSIS — B349 Viral infection, unspecified: Secondary | ICD-10-CM

## 2012-10-21 DIAGNOSIS — M549 Dorsalgia, unspecified: Secondary | ICD-10-CM | POA: Insufficient documentation

## 2012-10-21 DIAGNOSIS — Z8709 Personal history of other diseases of the respiratory system: Secondary | ICD-10-CM | POA: Insufficient documentation

## 2012-10-21 DIAGNOSIS — R509 Fever, unspecified: Secondary | ICD-10-CM | POA: Insufficient documentation

## 2012-10-21 DIAGNOSIS — B9789 Other viral agents as the cause of diseases classified elsewhere: Secondary | ICD-10-CM | POA: Insufficient documentation

## 2012-10-21 DIAGNOSIS — E669 Obesity, unspecified: Secondary | ICD-10-CM | POA: Insufficient documentation

## 2012-10-21 DIAGNOSIS — R51 Headache: Secondary | ICD-10-CM | POA: Insufficient documentation

## 2012-10-21 LAB — CBC WITH DIFFERENTIAL/PLATELET
Eosinophils Relative: 1 % (ref 0–5)
HCT: 37.2 % (ref 36.0–46.0)
Lymphocytes Relative: 19 % (ref 12–46)
Lymphs Abs: 2.4 10*3/uL (ref 0.7–4.0)
MCV: 77.5 fL — ABNORMAL LOW (ref 78.0–100.0)
Neutro Abs: 9.1 10*3/uL — ABNORMAL HIGH (ref 1.7–7.7)
Platelets: 274 10*3/uL (ref 150–400)
RBC: 4.8 MIL/uL (ref 3.87–5.11)
WBC: 13.2 10*3/uL — ABNORMAL HIGH (ref 4.0–10.5)

## 2012-10-21 LAB — COMPREHENSIVE METABOLIC PANEL
ALT: 7 U/L (ref 0–35)
Alkaline Phosphatase: 88 U/L (ref 39–117)
CO2: 25 mEq/L (ref 19–32)
Calcium: 9.5 mg/dL (ref 8.4–10.5)
Chloride: 103 mEq/L (ref 96–112)
GFR calc Af Amer: >90 mL/min (ref >90)
GFR calc non Af Amer: >90 mL/min (ref >90)
Glucose, Bld: 98 mg/dL (ref 70–99)
Sodium: 139 mEq/L (ref 135–145)
Total Bilirubin: 0.3 mg/dL (ref 0.3–1.2)

## 2012-10-21 LAB — PREGNANCY, URINE: Preg Test, Ur: NEGATIVE

## 2012-10-21 LAB — CULTURE, GROUP A STREP

## 2012-10-21 MED ORDER — KETOROLAC TROMETHAMINE 15 MG/ML IJ SOLN
15.0000 mg | Freq: Once | INTRAMUSCULAR | Status: AC
  Administered 2012-10-21: 15 mg via INTRAVENOUS
  Filled 2012-10-21: qty 1

## 2012-10-21 MED ORDER — IOHEXOL 300 MG/ML  SOLN
100.0000 mL | Freq: Once | INTRAMUSCULAR | Status: AC | PRN
  Administered 2012-10-21: 100 mL via INTRAVENOUS

## 2012-10-21 MED ORDER — ACETAMINOPHEN 325 MG PO TABS
650.0000 mg | ORAL_TABLET | Freq: Once | ORAL | Status: AC
  Administered 2012-10-21: 650 mg via ORAL
  Filled 2012-10-21: qty 2

## 2012-10-21 MED ORDER — SODIUM CHLORIDE 0.9 % IV BOLUS (SEPSIS)
1000.0000 mL | Freq: Once | INTRAVENOUS | Status: AC
  Administered 2012-10-21: 1000 mL via INTRAVENOUS

## 2012-10-21 MED ORDER — MORPHINE SULFATE 4 MG/ML IJ SOLN
6.0000 mg | Freq: Once | INTRAMUSCULAR | Status: AC
  Administered 2012-10-21: 6 mg via INTRAVENOUS
  Filled 2012-10-21: qty 2

## 2012-10-21 MED ORDER — HYDROCODONE-ACETAMINOPHEN 5-325 MG PO TABS: 2.0000 | ORAL_TABLET | Freq: Four times a day (QID) | ORAL | Status: DC | PRN

## 2012-10-21 NOTE — ED Notes (Signed)
Pt c/o neck, and back pain. Reports she was seen here 2 days ago with viral pharyngitis. Pt reports that pain has worsened and she is experiencing pressure in her head.

## 2012-10-21 NOTE — ED Provider Notes (Signed)
History  This chart was scribed for non-physician practitioner working with [name], MD by Greggory Stallion, ED scribe. This patient was seen in room WTR6/WTR6 and the patient's care was started at 6:29 PM.  CSN: [id number] Arrival date & time 10/21/12  1819   Chief Complaint  Patient presents with  . Neck Pain  . Back Pain    The history is provided by the patient. No language interpreter was used.    HPI Comments: Tara Bender is a 27 y.o. female who presents to the Emergency Department with chief complaint of gradually worsening, constant sharp neck pain and back pain that started last night. Pt states moving worsens the pain. Pt denies pain upon swallowing. She states she is also experiencing pressure in her head when she tilts it down. Pt states she has had low grade fevers. She states she took a Vicodin with some relief but then the pain came right back. Pt states she was seen here 2 days ago with viral pharyngitis. Pt denies rhinorrhea as associated symptoms. She states she has been sleeping fine.   Past Medical History  Diagnosis Date  . Obesity   . Bronchitis   . Hand laceration     lt  . Medical history non-contributory   . SVD (spontaneous vaginal delivery)     x 1  . Bronchitis     uses inhaler prn - rarely uses   Past Surgical History  Procedure Laterality Date  . Wound exploration  03/14/2012    Procedure: WOUND EXPLORATION;  Surgeon: Tami Ribas, MD;  Location: [facility];  Service: Orthopedics;  Laterality: Left;  Left Hand Repair Flexor Tendon, Possible Nerve/Artery Repair , debridement of tendon  . Left hand surgery      tendon repair  . Dilation and evacuation N/A 08/16/2012    Procedure: DILATATION AND EVACUATION;  Surgeon: Purcell Nails, MD;  Location: [facility];  Service: Gynecology;  Laterality: N/A;   No family history on file. History  Substance Use Topics  . Smoking status: Never Smoker   . Smokeless tobacco: Not on file   . Alcohol Use: Yes     Comment: socially   OB History   Grav Para Term Preterm Abortions TAB SAB Ect Mult Living   2 1 1       1      Review of Systems  A complete 10 system review of systems was obtained and all systems are negative except as noted in the HPI and PMH.   Allergies  Review of patient's allergies indicates no known allergies.  Home Medications   Current Outpatient Rx  Name  Route  Sig  Dispense  Refill  . HYDROcodone-acetaminophen (HYCET) 7.5-325 mg/15 ml solution   Oral   Take 15 mLs by mouth 4 (four) times daily as needed for pain.   120 mL   0    BP 145/72  Pulse 109  Temp(Src) 100.3 F (37.9 C) (Oral)  Resp 16  SpO2 100%  LMP 10/18/2012  Physical Exam  Nursing note and vitals reviewed. Constitutional: She is oriented to person, place, and time. She appears well-developed and well-nourished. No distress.  HENT:  Head: Normocephalic and atraumatic.  Oropharynx is clear. No exudates, no abscesses, no signs of infection. Airway is intact. Uvula is midline. No signs of Ludwig's angina.   Eyes: EOM are normal.  Neck: Neck supple. No tracheal deviation present.  Painful with ROM. Neck stiffness.   Cardiovascular:  Tachycardic.  Pulmonary/Chest: Effort normal. No respiratory distress.  Musculoskeletal: Normal range of motion.  Neurological: She is alert and oriented to person, place, and time.  Skin: Skin is warm and dry.  Psychiatric: She has a normal mood and affect. Her behavior is normal.    ED Course  Procedures (including critical care time)  DIAGNOSTIC STUDIES: Oxygen Saturation is 100% on RA, normal by my interpretation.    COORDINATION OF CARE: 6:45 PM-Discussed treatment plan with pt at bedside and pt agreed to plan.   7:00 PM-Moving pt in the back to do a lumbar puncture to test for meningitis. Pt agreed to plan.   Labs Reviewed - No data to display Results for orders placed during the hospital encounter of 10/21/12  CBC WITH  DIFFERENTIAL      Result Value Range   WBC 13.2 (*) 4.0 - 10.5 K/uL   RBC 4.80  3.87 - 5.11 MIL/uL   Hemoglobin 11.8 (*) 12.0 - 15.0 g/dL   HCT 16.1  09.6 - 04.5 %   MCV 77.5 (*) 78.0 - 100.0 fL   MCH 24.6 (*) 26.0 - 34.0 pg   MCHC 31.7  30.0 - 36.0 g/dL   RDW 40.9  81.1 - 91.4 %   Platelets 274  150 - 400 K/uL   Neutrophils Relative % 69  43 - 77 %   Neutro Abs 9.1 (*) 1.7 - 7.7 K/uL   Lymphocytes Relative 19  12 - 46 %   Lymphs Abs 2.4  0.7 - 4.0 K/uL   Monocytes Relative 11  3 - 12 %   Monocytes Absolute 1.5 (*) 0.1 - 1.0 K/uL   Eosinophils Relative 1  0 - 5 %   Eosinophils Absolute 0.2  0.0 - 0.7 K/uL   Basophils Relative 0  0 - 1 %   Basophils Absolute 0.0  0.0 - 0.1 K/uL  COMPREHENSIVE METABOLIC PANEL      Result Value Range   Sodium 139  135 - 145 mEq/L   Potassium 3.4 (*) 3.5 - 5.1 mEq/L   Chloride 103  96 - 112 mEq/L   CO2 25  19 - 32 mEq/L   Glucose, Bld 98  70 - 99 mg/dL   BUN 10  6 - 23 mg/dL   Creatinine, Ser 7.82  0.50 - 1.10 mg/dL   Calcium 9.5  8.4 - 95.6 mg/dL   Total Protein 8.2  6.0 - 8.3 g/dL   Albumin 3.7  3.5 - 5.2 g/dL   AST 12  0 - 37 U/L   ALT 7  0 - 35 U/L   Alkaline Phosphatase 88  39 - 117 U/L   Total Bilirubin 0.3  0.3 - 1.2 mg/dL   GFR calc non Af Amer >90  >90 mL/min   GFR calc Af Amer >90  >90 mL/min  PREGNANCY, URINE      Result Value Range   Preg Test, Ur NEGATIVE  NEGATIVE   Dg Wrist Complete Left  09/30/2012   *RADIOLOGY REPORT*  Clinical Data: Left wrist pain.  LEFT WRIST - COMPLETE 3+ VIEW  Comparison:  None.  Findings:  There is no evidence of fracture or dislocation.  There is no evidence of arthropathy or other focal bone abnormality. Soft tissues are unremarkable.  IMPRESSION: Negative.   Original Report Authenticated By: Irish Lack, M.D.   Ct Soft Tissue Neck W Contrast  10/21/2012   *RADIOLOGY REPORT*  Clinical Data: Neck pain.  CT NECK WITH CONTRAST  Technique:  Multidetector CT imaging of the neck was performed with  intravenous contrast.  Contrast: OMNIPAQUE IOHEXOL 300 MG/ML  SOLN  Comparison: None.  Findings: Visualized portions of the brain and posterior fossa are unremarkable.  Small mucous retention cyst is noted within the right maxillary sinus.  The visualized paranasal sinuses are otherwise clear.  The oral cavity, oropharynx, and nasopharynx are normal.  The larynx and hypopharynx are unremarkable.  The thyroid gland is unremarkable.  The salivary glands including the submandibular and parotid glands are unremarkable.  A mildly prominent 1.2 cm left level II node is noted (series 3, image 25).  Right level II adenopathy measuring up to 1.3 cm in short axis is present (series 3, image 35). Shotty subcentimeter nodes are seen at levels II-IV bilaterally.  No other pathologically enlarged lymph nodes are identified within the neck. No loculated fluid collections are seen.  No osseous abnormalities are identified.  IMPRESSION: Mildly enlarged level II lymph nodes with additional shotty adenopathy throughout the neck as above, which may be reactive in nature.  Otherwise unremarkable CT of the neck. Follow-up to resolution is recommended.   Original Report Authenticated By: Rise Mu, M.D.     1. Viral syndrome     MDM  Patient with headache, fever, and nuchal rigidity. Patient seen by and discussed with Dr. Juleen China. We'll move patient to the back for LP to rule out meningitis.  10:23 PM Patient seen by and discussed with Dr. Juleen China.  Dr. Juleen China was unable to obtain CSF via LP.  He tells me that due to her symptoms being more anterior neck pain, and the patient's general well appearance, he does not suspect Bacterial Meningitis.  Discussed having IR perform an additional LP, but decided that index of suspicion is low enough, that this is not necessary.  Dr. Juleen China tells me that the patient is stable to discharge home with pain medicine. No antibiotics indicated.  Strict return precautions have been  given.  Dr. Juleen China discussed the plan with the patient, and she agrees to return immediately for new or worsening symptoms.         I personally performed the services described in this documentation, which was scribed in my presence. The recorded information has been reviewed and is accurate.    Roxy Horseman, PA-C 10/21/12 2227

## 2012-10-21 NOTE — Progress Notes (Signed)
   CARE MANAGEMENT ED NOTE 10/21/2012  Patient:  Tara Bender, Tara Bender   Account Number:  [id number]  Date Initiated:  10/21/2012  Documentation initiated by:  Radford Pax  Subjective/Objective Assessment:   Patient presents to ED with  gradually worsening neck and back pain.     Subjective/Objective Assessment Detail:     Action/Plan:   Action/Plan Detail:   Anticipated DC Date:       Status Recommendation to Physician:   Result of Recommendation:    Other ED Services  Consult Working Plan    DC Planning Services  Other  PCP issues    Choice offered to / List presented to:            Status of service:  Completed, signed off  ED Comments:   ED Comments Detail:  Patient listed as not having a pcp.  Instructed patient to call the phone number on the back of her insurance card to find a doctor within network or to go to her insurance web site.  Patient verbalized understanding.

## 2012-10-23 ENCOUNTER — Emergency Department (HOSPITAL_COMMUNITY): Payer: Managed Care, Other (non HMO)

## 2012-10-23 ENCOUNTER — Emergency Department (HOSPITAL_COMMUNITY)
Admission: EM | Admit: 2012-10-23 | Discharge: 2012-10-24 | Disposition: A | Discharge status: Home / Self Care | Payer: Managed Care, Other (non HMO) | Attending: Emergency Medicine | Admitting: Emergency Medicine

## 2012-10-23 ENCOUNTER — Encounter (HOSPITAL_COMMUNITY): Payer: Self-pay | Admitting: Emergency Medicine

## 2012-10-23 DIAGNOSIS — G030 Nonpyogenic meningitis: Secondary | ICD-10-CM

## 2012-10-23 DIAGNOSIS — A879 Viral meningitis, unspecified: Secondary | ICD-10-CM | POA: Insufficient documentation

## 2012-10-23 DIAGNOSIS — R51 Headache: Secondary | ICD-10-CM | POA: Insufficient documentation

## 2012-10-23 DIAGNOSIS — Z8709 Personal history of other diseases of the respiratory system: Secondary | ICD-10-CM | POA: Insufficient documentation

## 2012-10-23 DIAGNOSIS — E669 Obesity, unspecified: Secondary | ICD-10-CM | POA: Insufficient documentation

## 2012-10-23 DIAGNOSIS — Z87828 Personal history of other (healed) physical injury and trauma: Secondary | ICD-10-CM | POA: Insufficient documentation

## 2012-10-23 LAB — CBC WITH DIFFERENTIAL/PLATELET
Basophils Absolute: 0.1 10*3/uL (ref 0.0–0.1)
HCT: 38.6 % (ref 36.0–46.0)
Lymphocytes Relative: 30 % (ref 12–46)
Monocytes Absolute: 0.7 10*3/uL (ref 0.1–1.0)
Neutro Abs: 4.2 10*3/uL (ref 1.7–7.7)
RBC: 5.04 MIL/uL (ref 3.87–5.11)
RDW: 14 % (ref 11.5–15.5)
WBC: 7.2 10*3/uL (ref 4.0–10.5)

## 2012-10-23 LAB — BASIC METABOLIC PANEL
CO2: 25 mEq/L (ref 19–32)
Chloride: 102 mEq/L (ref 96–112)
Creatinine, Ser: 0.65 mg/dL (ref 0.50–1.10)
Sodium: 138 mEq/L (ref 135–145)

## 2012-10-23 MED ORDER — SODIUM CHLORIDE 0.9 % IV BOLUS (SEPSIS)
500.0000 mL | Freq: Once | INTRAVENOUS | Status: AC
  Administered 2012-10-23: 500 mL via INTRAVENOUS

## 2012-10-23 MED ORDER — SODIUM CHLORIDE 0.9 % IV SOLN
INTRAVENOUS | Status: DC
  Administered 2012-10-23: 23:00:00 via INTRAVENOUS

## 2012-10-23 MED ORDER — ONDANSETRON HCL 4 MG/2ML IJ SOLN
4.0000 mg | Freq: Once | INTRAMUSCULAR | Status: AC
  Administered 2012-10-23: 4 mg via INTRAVENOUS
  Filled 2012-10-23: qty 2

## 2012-10-23 MED ORDER — HYDROMORPHONE HCL PF 1 MG/ML IJ SOLN
1.0000 mg | Freq: Once | INTRAMUSCULAR | Status: AC
  Administered 2012-10-23: 1 mg via INTRAVENOUS
  Filled 2012-10-23: qty 1

## 2012-10-23 MED ORDER — LORAZEPAM 2 MG/ML IJ SOLN
1.0000 mg | Freq: Once | INTRAMUSCULAR | Status: AC
  Administered 2012-10-23: 1 mg via INTRAVENOUS
  Filled 2012-10-23: qty 1

## 2012-10-23 NOTE — Progress Notes (Signed)
   CARE MANAGEMENT ED NOTE 10/23/2012  Patient:  Tara Bender, Tara Bender   Account Number:  [id number]  Date Initiated:  10/23/2012  Documentation initiated by:  Radford Pax  Subjective/Objective Assessment:   Patient presents to ED with lower back pain     Subjective/Objective Assessment Detail:     Action/Plan:   Action/Plan Detail:   Anticipated DC Date:       Status Recommendation to Physician:   Result of Recommendation:    Other ED Services  Consult Working Plan    DC Planning Services  Other  PCP issues    Choice offered to / List presented to:            Status of service:  Completed, signed off  ED Comments:   ED Comments Detail:  Patient listed as not having a pcp.  Instructed patient to call the phone number listed on the back of her insurance card to find a pcp who is in network or look under [employer].  Patient verbalized understanding.

## 2012-10-23 NOTE — ED Notes (Signed)
Per EMS: Pt had spinal tap yesterday, is now having lower back pain. Pt is concerned about possible meningitis. Pt sensitive to light and very weak.

## 2012-10-23 NOTE — ED Provider Notes (Signed)
History    CSN: [id number] Arrival date & time 10/23/12  2056  First MD Initiated Contact with Patient 10/23/12 2106     Chief Complaint  Patient presents with  . Back Pain   (Consider location/radiation/quality/duration/timing/severity/associated sxs/prior Treatment) HPI Comments: Tara Bender is a 27 y.o. female who presents for evaluation of headache, and back pain. Her symptoms started several days ago and worsened today. She was in the  ED, on 10/21/12 to evaluate neck and back pain.  Her evaluation included an attempt at spinal tap that was unsuccessful. Her back pain is in the area where she had the spinal tap attempted. She also complains of eye pain blurred vision and photophobia for one day. She denies nausea, vomiting, weakness, paresthesias. She is taking Vicodin, without relief of her pain. She has sick contacts, at work. She works in Audiological scientist. Her infant son, is not ill. She's not had anything like this previously. There are no other known modifying factors.  Patient is a 27 y.o. female presenting with back pain. The history is provided by the patient.  Back Pain  Past Medical History  Diagnosis Date  . Obesity   . Bronchitis   . Hand laceration     lt  . Medical history non-contributory   . SVD (spontaneous vaginal delivery)     x 1  . Bronchitis     uses inhaler prn - rarely uses   Past Surgical History  Procedure Laterality Date  . Wound exploration  03/14/2012    Procedure: WOUND EXPLORATION;  Surgeon: Tami Ribas, MD;  Location: [facility];  Service: Orthopedics;  Laterality: Left;  Left Hand Repair Flexor Tendon, Possible Nerve/Artery Repair , debridement of tendon  . Left hand surgery      tendon repair  . Dilation and evacuation N/A 08/16/2012    Procedure: DILATATION AND EVACUATION;  Surgeon: Purcell Nails, MD;  Location: [facility];  Service: Gynecology;  Laterality: N/A;   History reviewed. No pertinent family history. History   Substance Use Topics  . Smoking status: Never Smoker   . Smokeless tobacco: Not on file  . Alcohol Use: Yes     Comment: socially   OB History   Grav Para Term Preterm Abortions TAB SAB Ect Mult Living   2 1 1       1      Review of Systems  Musculoskeletal: Positive for back pain.  All other systems reviewed and are negative.    Allergies  Review of patient's allergies indicates no known allergies.  Home Medications   Current Outpatient Rx  Name  Route  Sig  Dispense  Refill  . HYDROcodone-acetaminophen (NORCO/VICODIN) 5-325 MG per tablet   Oral   Take 2 tablets by mouth every 6 (six) hours as needed for pain.   15 tablet   0   . ibuprofen (ADVIL,MOTRIN) 800 MG tablet   Oral   Take 1 tablet (800 mg total) by mouth 3 (three) times daily.   21 tablet   0   . traMADol (ULTRAM) 50 MG tablet   Oral   Take 1 tablet (50 mg total) by mouth every 8 (eight) hours as needed for pain.   5 tablet   0    BP 122/65  Pulse 65  Temp(Src) 99.2 F (37.3 C) (Oral)  Resp 16  Ht 5\' 3"  (1.6 m)  Wt 227 lb (102.967 kg)  BMI 40.22 kg/m2  SpO2 97%  LMP 10/18/2012 Physical  Exam  Nursing note and vitals reviewed. Constitutional: She is oriented to person, place, and time. She appears well-developed.  Obese  HENT:  Head: Normocephalic and atraumatic.  Eyes: Conjunctivae and EOM are normal. Pupils are equal, round, and reactive to light.  Moderate photophobia. Pupils equal, bilaterally- she resists light confrontation to assess pupillary reaction.  Neck: Normal range of motion and phonation normal. Neck supple. No tracheal deviation present.  She does not resist neck flexion, secondary to posterior, cervical pain, but states that neck movement causes her head to hurt more.  Cardiovascular: Normal rate, regular rhythm and intact distal pulses.   Pulmonary/Chest: Effort normal and breath sounds normal. She exhibits no tenderness.  Abdominal: Soft. She exhibits no distension. There  is no tenderness. There is no guarding.  Musculoskeletal: Normal range of motion.  Neurological: She is alert and oriented to person, place, and time. She has normal strength. No cranial nerve deficit. She exhibits normal muscle tone. Coordination normal.  Skin: Skin is warm and dry.  Psychiatric: Her behavior is normal. Judgment and thought content normal.  Tearful    ED Course  LUMBAR PUNCTURE Date/Time: 10/24/2012 12:03 AM Performed by: Effie Shy, Rocklyn Mayberry L Authorized by: Mancel Bale L Consent: Verbal consent obtained. written consent obtained. Risks and benefits: risks, benefits and alternatives were discussed Consent given by: patient Patient understanding: patient states understanding of the procedure being performed Patient consent: the patient's understanding of the procedure matches consent given Procedure consent: procedure consent matches procedure scheduled Relevant documents: relevant documents present and verified Test results: test results available and properly labeled Site marked: the operative site was marked Imaging studies: imaging studies available Required items: required blood products, implants, devices, and special equipment available Patient identity confirmed: verbally with patient Time out: Immediately prior to procedure a "time out" was called to verify the correct patient, procedure, equipment, support staff and site/side marked as required. Indications: evaluation for infection Anesthesia: local infiltration Local anesthetic: lidocaine 1% without epinephrine Anesthetic total: 3 ml Patient sedated: no Preparation: Patient was prepped and draped in the usual sterile fashion. Lumbar space: L3-L4 interspace Patient's position: sitting Needle gauge: 20 Needle type: diamond point Needle length: 3.5 in Number of attempts: 1 Fluid appearance: clear Tubes of fluid: 4 Total volume: 5 ml Post-procedure: site cleaned and adhesive bandage applied   (including  critical care time) Medications  sodium chloride 0.9 % bolus 500 mL (0 mLs Intravenous Stopped 10/23/12 2239)  HYDROmorphone (DILAUDID) injection 1 mg (1 mg Intravenous Given 10/23/12 2153)  ondansetron (ZOFRAN) injection 4 mg (4 mg Intravenous Given 10/23/12 2153)  HYDROmorphone (DILAUDID) injection 1 mg (1 mg Intravenous Given 10/23/12 2332)  LORazepam (ATIVAN) injection 1 mg (1 mg Intravenous Given 10/23/12 2332)  potassium chloride SA (K-DUR,KLOR-CON) CR tablet 20 mEq (20 mEq Oral Given 10/24/12 0131)  ketorolac (TORADOL) 30 MG/ML injection 30 mg (30 mg Intravenous Given 10/24/12 0218)  metoCLOPramide (REGLAN) injection 10 mg (10 mg Intravenous Given 10/24/12 0219)  dexamethasone (DECADRON) injection 10 mg (10 mg Intravenous Given 10/24/12 0219)    Patient Vitals for the past 24 hrs:  BP Temp Temp src Pulse Resp SpO2 Height Weight  10/24/12 0159 122/65 mmHg - - 65 16 97 % - -  10/23/12 2058 115/60 mmHg 99.2 F (37.3 C) Oral 81 16 100 % 5\' 3"  (1.6 m) 227 lb (102.967 kg)    10:50 PM Reevaluation with update and discussion. After initial assessment and treatment, an updated evaluation reveals pt still c/o HA.  Neck-  She is able to touch chin to chest while sitting.  I told her that we needed to do a LP to evaluate for meningitis. She does not want to do it because the attempt, 2 days ago, caused too much pain. Melchizedek Espinola L    11:28 PM Reevaluation with update and discussion. After initial assessment and treatment, an updated evaluation reveals She now agrees to the LP, consent assigned. Shahin Knierim L      Labs Reviewed  CBC WITH DIFFERENTIAL - Abnormal; Notable for the following:    MCV 76.6 (*)    MCH 24.6 (*)    All other components within normal limits  BASIC METABOLIC PANEL - Abnormal; Notable for the following:    Potassium 3.3 (*)    All other components within normal limits  URINALYSIS, ROUTINE W REFLEX MICROSCOPIC - Abnormal; Notable for the following:    Specific Gravity, Urine 1.031  (*)    Bilirubin Urine SMALL (*)    Ketones, ur >80 (*)    All other components within normal limits  CSF CELL COUNT WITH DIFFERENTIAL - Abnormal; Notable for the following:    RBC Count, CSF 123 (*)    WBC, CSF 30 (*)    Segmented Neutrophils-CSF 30 (*)    All other components within normal limits  CSF CELL COUNT WITH DIFFERENTIAL - Abnormal; Notable for the following:    RBC Count, CSF 6 (*)    WBC, CSF 30 (*)    Segmented Neutrophils-CSF 25 (*)    All other components within normal limits  CSF CULTURE  GRAM STAIN  URINE CULTURE  PROTEIN AND GLUCOSE, CSF  CRYPTOCOCCAL ANTIGEN, CSF   Ct Head Wo Contrast  10/23/2012   *RADIOLOGY REPORT*  Clinical Data: Back pain.  Status post lumbar puncture 10/18/2012.  CT HEAD WITHOUT CONTRAST  Technique:  Contiguous axial images were obtained from the base of the skull through the vertex without contrast.  Comparison: None.  Findings: The brain appears normal without infarct, hemorrhage, mass lesion, mass effect, midline shift or abnormal extra-axial fluid collection.  There is no hydrocephalus or pneumocephalus. The calvarium is intact.  Imaged paranasal sinuses and mastoid air cells are clear.  IMPRESSION: Negative exam.   Original Report Authenticated By: Holley Dexter, M.D.   1. Aseptic meningitis     MDM  Headache requiring evaluation with LP. Patient tolerated procedure well. CT scan brain negative. No evidence for sinusitis or from attempt at LP, 2 days ago.  If LP is negative, pt is a candidate for at  home treatment.  Nursing Notes Reviewed/ Care Coordinated, and agree without changes. Applicable Imaging Reviewed.  Interpretation of Laboratory Data incorporated into ED treatment   Care to Dr. Dierdre Highman to evaluate after CSF labs return.   Flint Melter, MD 10/24/12 [phone]

## 2012-10-23 NOTE — ED Notes (Signed)
WUJ:[id number]<JY> Expected date:10/23/12<BR> Expected time: 8:43 PM<BR> Means of arrival:Ambulance<BR> Comments:<BR> Lower back pain

## 2012-10-24 LAB — CSF CELL COUNT WITH DIFFERENTIAL
Lymphs, CSF: 75 % (ref 40–80)
RBC Count, CSF: 123 /mm3 — ABNORMAL HIGH
RBC Count, CSF: 6 /mm3 — ABNORMAL HIGH
Tube #: 1
Tube #: 4

## 2012-10-24 LAB — PROTEIN AND GLUCOSE, CSF: Glucose, CSF: 65 mg/dL (ref 43–76)

## 2012-10-24 LAB — URINALYSIS, ROUTINE W REFLEX MICROSCOPIC
Glucose, UA: NEGATIVE mg/dL
Leukocytes, UA: NEGATIVE
pH: 6 (ref 5.0–8.0)

## 2012-10-24 LAB — GRAM STAIN

## 2012-10-24 MED ORDER — TRAMADOL HCL 50 MG PO TABS: 50.0000 mg | ORAL_TABLET | Freq: Three times a day (TID) | ORAL | Status: DC | PRN

## 2012-10-24 MED ORDER — KETOROLAC TROMETHAMINE 30 MG/ML IJ SOLN
30.0000 mg | Freq: Once | INTRAMUSCULAR | Status: AC
  Administered 2012-10-24: 30 mg via INTRAVENOUS
  Filled 2012-10-24: qty 1

## 2012-10-24 MED ORDER — POTASSIUM CHLORIDE CRYS ER 20 MEQ PO TBCR
20.0000 meq | EXTENDED_RELEASE_TABLET | Freq: Once | ORAL | Status: AC
  Administered 2012-10-24: 20 meq via ORAL
  Filled 2012-10-24: qty 1

## 2012-10-24 MED ORDER — IBUPROFEN 800 MG PO TABS: 800.0000 mg | ORAL_TABLET | Freq: Three times a day (TID) | ORAL | Status: DC

## 2012-10-24 MED ORDER — METOCLOPRAMIDE HCL 5 MG/ML IJ SOLN
10.0000 mg | Freq: Once | INTRAMUSCULAR | Status: AC
  Administered 2012-10-24: 10 mg via INTRAVENOUS
  Filled 2012-10-24: qty 2

## 2012-10-24 MED ORDER — DEXAMETHASONE SODIUM PHOSPHATE 10 MG/ML IJ SOLN
10.0000 mg | Freq: Once | INTRAMUSCULAR | Status: AC
  Administered 2012-10-24: 10 mg via INTRAVENOUS
  Filled 2012-10-24: qty 1

## 2012-10-24 NOTE — ED Provider Notes (Signed)
2:24 AM recheck - still has 10/10 HA.  PT not willing to go home with severe symptoms including severe photophobia. CSF reviewed and d/w ID DR. Synthia Innocent - he recs add a stat cryptococcal to CSF an if positive needs further eval for HIV, etc.  Otherwise, feels is viral, not bacterial meningitis.   IV HA cocktail provided  4:39 AM cryptococcal Ag neg. On recheck is feeling much better, HA and photophobia improved/ stable for d/c home, precautions and WM provided  Results for orders placed during the hospital encounter of 10/23/12  GRAM STAIN      Result Value Range   Specimen Description CSF     Special Requests Normal     Gram Stain       Value: WBC PRESENT, PREDOMINANTLY MONONUCLEAR     NO ORGANISMS SEEN     CYTOSPUN SLIDE, CORRELATE WITH MICROBIOLOGY     Gram Stain Report Called to,Read Back By and Verified With: TCAMPBELL RN AT 0125 ON [id number] BY DLONG   Report Status 10/24/2012 FINAL    CBC WITH DIFFERENTIAL      Result Value Range   WBC 7.2  4.0 - 10.5 K/uL   RBC 5.04  3.87 - 5.11 MIL/uL   Hemoglobin 12.4  12.0 - 15.0 g/dL   HCT 04.5  40.9 - 81.1 %   MCV 76.6 (*) 78.0 - 100.0 fL   MCH 24.6 (*) 26.0 - 34.0 pg   MCHC 32.1  30.0 - 36.0 g/dL   RDW 91.4  78.2 - 95.6 %   Platelets 272  150 - 400 K/uL   Neutrophils Relative % 58  43 - 77 %   Neutro Abs 4.2  1.7 - 7.7 K/uL   Lymphocytes Relative 30  12 - 46 %   Lymphs Abs 2.2  0.7 - 4.0 K/uL   Monocytes Relative 10  3 - 12 %   Monocytes Absolute 0.7  0.1 - 1.0 K/uL   Eosinophils Relative 2  0 - 5 %   Eosinophils Absolute 0.1  0.0 - 0.7 K/uL   Basophils Relative 1  0 - 1 %   Basophils Absolute 0.1  0.0 - 0.1 K/uL  BASIC METABOLIC PANEL      Result Value Range   Sodium 138  135 - 145 mEq/L   Potassium 3.3 (*) 3.5 - 5.1 mEq/L   Chloride 102  96 - 112 mEq/L   CO2 25  19 - 32 mEq/L   Glucose, Bld 95  70 - 99 mg/dL   BUN 8  6 - 23 mg/dL   Creatinine, Ser 2.13  0.50 - 1.10 mg/dL   Calcium 9.5  8.4 - 08.6 mg/dL   GFR calc non Af  Amer >90  >90 mL/min   GFR calc Af Amer >90  >90 mL/min  CSF CELL COUNT WITH DIFFERENTIAL      Result Value Range   Tube # 1     Color, CSF COLORLESS  COLORLESS   Appearance, CSF CLEAR  CLEAR   Supernatant NOT INDICATED     RBC Count, CSF 123 (*) 0 /cu mm   WBC, CSF 30 (*) 0 - 5 /cu mm   Segmented Neutrophils-CSF 30 (*) 0 - 6 %   Lymphs, CSF 70  40 - 80 %  PROTEIN AND GLUCOSE, CSF      Result Value Range   Glucose, CSF 65  43 - 76 mg/dL   Total  Protein, CSF 23  15 -  45 mg/dL  CSF CELL COUNT WITH DIFFERENTIAL      Result Value Range   Tube # 4     Color, CSF COLORLESS  COLORLESS   Appearance, CSF CLEAR  CLEAR   Supernatant NOT INDICATED     RBC Count, CSF 6 (*) 0 /cu mm   WBC, CSF 30 (*) 0 - 5 /cu mm   Segmented Neutrophils-CSF 25 (*) 0 - 6 %   Lymphs, CSF 75  40 - 80 %   Dg Wrist Complete Left  09/30/2012   *RADIOLOGY REPORT*  Clinical Data: Left wrist pain.  LEFT WRIST - COMPLETE 3+ VIEW  Comparison:  None.  Findings:  There is no evidence of fracture or dislocation.  There is no evidence of arthropathy or other focal bone abnormality. Soft tissues are unremarkable.  IMPRESSION: Negative.   Original Report Authenticated By: Irish Lack, M.D.   Ct Head Wo Contrast  10/23/2012   *RADIOLOGY REPORT*  Clinical Data: Back pain.  Status post lumbar puncture 10/18/2012.  CT HEAD WITHOUT CONTRAST  Technique:  Contiguous axial images were obtained from the base of the skull through the vertex without contrast.  Comparison: None.  Findings: The brain appears normal without infarct, hemorrhage, mass lesion, mass effect, midline shift or abnormal extra-axial fluid collection.  There is no hydrocephalus or pneumocephalus. The calvarium is intact.  Imaged paranasal sinuses and mastoid air cells are clear.  IMPRESSION: Negative exam.   Original Report Authenticated By: Holley Dexter, M.D.   Ct Soft Tissue Neck W Contrast  10/21/2012   *RADIOLOGY REPORT*  Clinical Data: Neck pain.  CT NECK  WITH CONTRAST  Technique:  Multidetector CT imaging of the neck was performed with intravenous contrast.  Contrast: OMNIPAQUE IOHEXOL 300 MG/ML  SOLN  Comparison: None.  Findings: Visualized portions of the brain and posterior fossa are unremarkable.  Small mucous retention cyst is noted within the right maxillary sinus.  The visualized paranasal sinuses are otherwise clear.  The oral cavity, oropharynx, and nasopharynx are normal.  The larynx and hypopharynx are unremarkable.  The thyroid gland is unremarkable.  The salivary glands including the submandibular and parotid glands are unremarkable.  A mildly prominent 1.2 cm left level II node is noted (series 3, image 25).  Right level II adenopathy measuring up to 1.3 cm in short axis is present (series 3, image 35). Shotty subcentimeter nodes are seen at levels II-IV bilaterally.  No other pathologically enlarged lymph nodes are identified within the neck. No loculated fluid collections are seen.  No osseous abnormalities are identified.  IMPRESSION: Mildly enlarged level II lymph nodes with additional shotty adenopathy throughout the neck as above, which may be reactive in nature.  Otherwise unremarkable CT of the neck. Follow-up to resolution is recommended.   Original Report Authenticated By: Rise Mu, M.D.       Sunnie Nielsen, MD 10/24/12 [phone]

## 2012-10-24 NOTE — ED Notes (Signed)
Critical spinal fluid results reported to Dr Dierdre Highman

## 2012-10-25 LAB — URINE CULTURE: Colony Count: >=100000

## 2012-10-26 ENCOUNTER — Emergency Department (HOSPITAL_COMMUNITY)
Admission: EM | Admit: 2012-10-26 | Discharge: 2012-10-26 | Disposition: A | Discharge status: Home / Self Care | Payer: Managed Care, Other (non HMO) | Attending: Emergency Medicine | Admitting: Emergency Medicine

## 2012-10-26 ENCOUNTER — Encounter (HOSPITAL_COMMUNITY): Payer: Self-pay | Admitting: Emergency Medicine

## 2012-10-26 DIAGNOSIS — Z8709 Personal history of other diseases of the respiratory system: Secondary | ICD-10-CM | POA: Insufficient documentation

## 2012-10-26 DIAGNOSIS — R51 Headache: Secondary | ICD-10-CM | POA: Insufficient documentation

## 2012-10-26 DIAGNOSIS — Z79899 Other long term (current) drug therapy: Secondary | ICD-10-CM | POA: Insufficient documentation

## 2012-10-26 DIAGNOSIS — E669 Obesity, unspecified: Secondary | ICD-10-CM | POA: Insufficient documentation

## 2012-10-26 DIAGNOSIS — M542 Cervicalgia: Secondary | ICD-10-CM | POA: Insufficient documentation

## 2012-10-26 DIAGNOSIS — G030 Nonpyogenic meningitis: Secondary | ICD-10-CM

## 2012-10-26 DIAGNOSIS — A879 Viral meningitis, unspecified: Secondary | ICD-10-CM | POA: Insufficient documentation

## 2012-10-26 MED ORDER — DIPHENHYDRAMINE HCL 50 MG/ML IJ SOLN
25.0000 mg | Freq: Once | INTRAMUSCULAR | Status: AC
  Administered 2012-10-26: 25 mg via INTRAVENOUS
  Filled 2012-10-26: qty 1

## 2012-10-26 MED ORDER — SODIUM CHLORIDE 0.9 % IV BOLUS (SEPSIS)
1000.0000 mL | Freq: Once | INTRAVENOUS | Status: AC
  Administered 2012-10-26: 1000 mL via INTRAVENOUS

## 2012-10-26 MED ORDER — HYDROMORPHONE HCL PF 1 MG/ML IJ SOLN
1.0000 mg | Freq: Once | INTRAMUSCULAR | Status: AC
  Administered 2012-10-26: 1 mg via INTRAVENOUS
  Filled 2012-10-26: qty 1

## 2012-10-26 MED ORDER — OXYCODONE-ACETAMINOPHEN 5-325 MG PO TABS: 1.0000 | ORAL_TABLET | Freq: Four times a day (QID) | ORAL | Status: DC | PRN

## 2012-10-26 MED ORDER — METOCLOPRAMIDE HCL 5 MG/ML IJ SOLN
10.0000 mg | Freq: Once | INTRAMUSCULAR | Status: AC
  Administered 2012-10-26: 10 mg via INTRAVENOUS
  Filled 2012-10-26: qty 2

## 2012-10-26 MED ORDER — KETOROLAC TROMETHAMINE 30 MG/ML IJ SOLN
30.0000 mg | Freq: Once | INTRAMUSCULAR | Status: AC
  Administered 2012-10-26: 30 mg via INTRAVENOUS
  Filled 2012-10-26: qty 1

## 2012-10-26 MED ORDER — SODIUM CHLORIDE 0.9 % IV BOLUS (SEPSIS): 1000.0000 mL | Freq: Once | INTRAVENOUS | Status: DC

## 2012-10-26 NOTE — ED Notes (Signed)
Patient was educated not to drive, operate heavy machinery, or drink alcohol while taking narcotic medication.  

## 2012-10-26 NOTE — ED Notes (Addendum)
Pt reports head and neck pain that is 10/10. Pt had spinal tap a few days ago for viral meningitis. Pt says pain meds given for home are not helping. Denies fall or injury.

## 2012-10-26 NOTE — ED Provider Notes (Signed)
History    CSN: [id number] Arrival date & time 10/26/12  1439  First MD Initiated Contact with Patient 10/26/12 1650     Chief Complaint  Patient presents with  . Viral Meningitis   . Neck Pain   (Consider location/radiation/quality/duration/timing/severity/associated sxs/prior Treatment) HPI Pt presenting with complaint of headache and neck pain which has been continuous since her diagnosis of aseptic meningitis. She had a lumbar puncture performed approximately 3 days ago and has been continuing to have pain in her neck and head. She states that the pain is not worse than it was prior to the procedure but is continuous. She denies fever, vomiting, changes in vision or speech. She has no weakness of her extremities. She has been taking ibuprofen as well as hydrocodone which is not helping with her pain. She states that she's been drinking a normal amount of liquids. She does have some photosensitivity.  There are no other associated systemic symptoms, there are no other alleviating or modifying factors.  Past Medical History  Diagnosis Date  . Obesity   . Bronchitis   . Hand laceration     lt  . Medical history non-contributory   . SVD (spontaneous vaginal delivery)     x 1  . Bronchitis     uses inhaler prn - rarely uses   Past Surgical History  Procedure Laterality Date  . Wound exploration  03/14/2012    Procedure: WOUND EXPLORATION;  Surgeon: Tami Ribas, MD;  Location: [facility];  Service: Orthopedics;  Laterality: Left;  Left Hand Repair Flexor Tendon, Possible Nerve/Artery Repair , debridement of tendon  . Left hand surgery      tendon repair  . Dilation and evacuation N/A 08/16/2012    Procedure: DILATATION AND EVACUATION;  Surgeon: Purcell Nails, MD;  Location: [facility];  Service: Gynecology;  Laterality: N/A;   No family history on file. History  Substance Use Topics  . Smoking status: Never Smoker   . Smokeless tobacco: Not on file  . Alcohol  Use: Yes     Comment: socially   OB History   Grav Para Term Preterm Abortions TAB SAB Ect Mult Living   2 1 1       1      Review of Systems ROS reviewed and all otherwise negative except for mentioned in HPI  Allergies  Review of patient's allergies indicates no known allergies.  Home Medications   Current Outpatient Rx  Name  Route  Sig  Dispense  Refill  . HYDROcodone-acetaminophen (NORCO/VICODIN) 5-325 MG per tablet   Oral   Take 2 tablets by mouth every 6 (six) hours as needed for pain.   15 tablet   0   . ibuprofen (ADVIL,MOTRIN) 800 MG tablet   Oral   Take 1 tablet (800 mg total) by mouth 3 (three) times daily.   21 tablet   0   . traMADol (ULTRAM) 50 MG tablet   Oral   Take 1 tablet (50 mg total) by mouth every 8 (eight) hours as needed for pain.   5 tablet   0   . oxyCODONE-acetaminophen (PERCOCET/ROXICET) 5-325 MG per tablet   Oral   Take 1-2 tablets by mouth every 6 (six) hours as needed for pain.   20 tablet   0    BP 123/60  Pulse 64  Temp(Src) 99 F (37.2 C) (Oral)  Resp 18  SpO2 100%  LMP 10/18/2012 Vitals reviewed Physical Exam Physical Examination: General  appearance - alert, uncomfortable appearing, and in no distress Mental status - alert, oriented to person, place, and time Eyes - pupils equal and reactive, extraocular eye movements intact Mouth - mucous membranes moist, pharynx normal without lesions Neck - supple, no significant adenopathy Chest - clear to auscultation, no wheezes, rales or rhonchi, symmetric air entry Heart - normal rate, regular rhythm, normal S1, S2, no murmurs, rubs, clicks or gallops Abdomen - soft, nontender, nondistended, no masses or organomegaly Neurological - alert, oriented, normal speech, cranial nerves 2-12 tested and intact, strength 5/5 in extremities x 4, sensation intact Musculoskeletal - no joint tenderness, deformity or swelling Extremities - peripheral pulses normal, no pedal edema, no clubbing or  cyanosis Skin - normal coloration and turgor, no rashes  ED Course  Procedures (including critical care time)  5:02 PM went to see patient, not yet in room Labs Reviewed - No data to display No results found. 1. Aseptic meningitis     MDM  Pt presenting with c/o headache and neck pain.  She was diagnosed with aseptic meningitis 3 days ago in ED.  Per chart review her CSF culture has had no growth.  Plan was d/w ID at her initial visit.  She was treated with IV fluids, migraine cocktail and dilaudid.  She received 2L IV fluids.  She may have post LP headache, but I feel this is less likely due to no increase in pain after LP, she states the pain is similar to prior LP being performed and pain of this type would be expected with aseptic meningitis.  Pt rechecked and states she continues to have pain, but appears more comfortable in the bed, she is moving around more freely and seems to have had some pain relief.  Will increase pain meds to oxycodone for home use.  Discharged with strict return precautions.  Pt agreeable with plan.   Ethelda Chick, MD 10/26/12 [phone]

## 2012-10-26 NOTE — ED Notes (Signed)
MD at bedside. 

## 2012-10-27 LAB — CSF CULTURE W GRAM STAIN: Special Requests: NORMAL

## 2012-10-28 NOTE — ED Provider Notes (Signed)
Medical screening examination/treatment/procedure(s) were conducted as a shared visit with non-physician practitioner(s) and myself.  I personally evaluated the patient during the encounter.  26yf with HA, neck pain and fever. Recent diagnosis of viral pharyngitis. Has not been on abx. Pt with diffuse neck pain. On exam tenderness anteriorly. No evidence of airway compromise. CT neck with adenopathy, otherwise unremarkable. Pt well appearing on exam, but symptoms concerning for possible meningitis. LP attempted unsuccessfully. Discussed with pt possibility of meningitis. If so, suspect viral, but cannot exclude bacterial w/o CSF studies. Discussed with pt possible admit and likely LP by radiology. Pt electing for discharge. I do not feel unreasonable. Pt is nontoxic. Length of symptoms more consistent with viral process. Has been in ED for several hours w/o significant change in symptoms. Would not prescribe abx at this time. Emergent return precautions discussed.     [name], MD 10/28/12 [phone]

## 2012-11-12 ENCOUNTER — Encounter (HOSPITAL_COMMUNITY): Payer: Self-pay

## 2012-11-12 ENCOUNTER — Emergency Department (HOSPITAL_COMMUNITY)
Admission: EM | Admit: 2012-11-12 | Discharge: 2012-11-12 | Disposition: A | Discharge status: Home / Self Care | Payer: Managed Care, Other (non HMO) | Attending: Emergency Medicine | Admitting: Emergency Medicine

## 2012-11-12 DIAGNOSIS — Z87828 Personal history of other (healed) physical injury and trauma: Secondary | ICD-10-CM | POA: Insufficient documentation

## 2012-11-12 DIAGNOSIS — E669 Obesity, unspecified: Secondary | ICD-10-CM | POA: Insufficient documentation

## 2012-11-12 DIAGNOSIS — R112 Nausea with vomiting, unspecified: Secondary | ICD-10-CM | POA: Insufficient documentation

## 2012-11-12 DIAGNOSIS — Z8709 Personal history of other diseases of the respiratory system: Secondary | ICD-10-CM | POA: Insufficient documentation

## 2012-11-12 DIAGNOSIS — Z791 Long term (current) use of non-steroidal anti-inflammatories (NSAID): Secondary | ICD-10-CM | POA: Insufficient documentation

## 2012-11-12 DIAGNOSIS — G43909 Migraine, unspecified, not intractable, without status migrainosus: Secondary | ICD-10-CM | POA: Insufficient documentation

## 2012-11-12 DIAGNOSIS — Z8669 Personal history of other diseases of the nervous system and sense organs: Secondary | ICD-10-CM | POA: Insufficient documentation

## 2012-11-12 HISTORY — DX: Meningitis, unspecified: G03.9

## 2012-11-12 MED ORDER — METOCLOPRAMIDE HCL 5 MG/ML IJ SOLN
10.0000 mg | Freq: Once | INTRAMUSCULAR | Status: AC
  Administered 2012-11-12: 10 mg via INTRAVENOUS
  Filled 2012-11-12: qty 2

## 2012-11-12 MED ORDER — ONDANSETRON HCL 8 MG PO TABS: 4.0000 mg | ORAL_TABLET | ORAL | Status: DC | PRN

## 2012-11-12 MED ORDER — DIPHENHYDRAMINE HCL 50 MG/ML IJ SOLN
25.0000 mg | Freq: Once | INTRAMUSCULAR | Status: AC
  Administered 2012-11-12: 25 mg via INTRAVENOUS
  Filled 2012-11-12: qty 1

## 2012-11-12 MED ORDER — SODIUM CHLORIDE 0.9 % IV BOLUS (SEPSIS)
1000.0000 mL | Freq: Once | INTRAVENOUS | Status: AC
  Administered 2012-11-12: 1000 mL via INTRAVENOUS

## 2012-11-12 MED ORDER — KETOROLAC TROMETHAMINE 30 MG/ML IJ SOLN
30.0000 mg | Freq: Once | INTRAMUSCULAR | Status: AC
  Administered 2012-11-12: 30 mg via INTRAVENOUS
  Filled 2012-11-12: qty 1

## 2012-11-12 MED ORDER — DEXAMETHASONE SODIUM PHOSPHATE 10 MG/ML IJ SOLN
10.0000 mg | Freq: Once | INTRAMUSCULAR | Status: AC
  Administered 2012-11-12: 10 mg via INTRAVENOUS
  Filled 2012-11-12: qty 1

## 2012-11-12 NOTE — ED Notes (Addendum)
Pt presents with c/o headache that started last night. Denies sensitivity to light, blurred vision, or dizziness. Pt says she has also been vomiting today x 3-4 times. Denies urinary symptoms or back pain. Denies diarrhea. Pain 10/10.

## 2012-11-12 NOTE — ED Provider Notes (Signed)
History    CSN: [id number] Arrival date & time 11/12/12  1656  First MD Initiated Contact with Patient 11/12/12 1711     Chief Complaint  Patient presents with  . Headache  . Emesis   (Consider location/radiation/quality/duration/timing/severity/associated sxs/prior Treatment) The history is provided by the patient and medical records. No language interpreter was used.   Tara Bender is a 27 y.o. female  with a hx of recent aseptic meningitis dx presents to the Emergency Department complaining of gradual, persistent, progressively worsening headacge beginning last night about 11pm.  Pt headache is described as achy, generalized, with radiation to the right side of the neck; not at all like the headache she had recently.  Pt states she has taken Vicodin (5mg  tab last night and 10mg  at 5am) and ibuprofen  (800mg  at noon) without relief.  Associated symptoms include nausea, vomiting.  Pt states movement and change of position makes it worse.  Pt denies fever, chills, chest pain, SOB, abd pain, diarrhea, weakness, dizziness, gait disturbance, speech disturbance.    Record review: 10/21/12 with LP and aseptic meningitis; 10/27/11 with persistent post LP headache and blood patch was discussed.  Pt states this headache resolved after F/u with PCP [facility] - [facility]) and did not reccur until today.       Past Medical History  Diagnosis Date  . Obesity   . Bronchitis   . Hand laceration     lt  . Medical history non-contributory   . SVD (spontaneous vaginal delivery)     x 1  . Bronchitis     uses inhaler prn - rarely uses  . Meningitis    Past Surgical History  Procedure Laterality Date  . Wound exploration  03/14/2012    Procedure: WOUND EXPLORATION;  Surgeon: Tami Ribas, MD;  Location: [facility];  Service: Orthopedics;  Laterality: Left;  Left Hand Repair Flexor Tendon, Possible Nerve/Artery Repair , debridement of tendon  . Left hand  surgery      tendon repair  . Dilation and evacuation N/A 08/16/2012    Procedure: DILATATION AND EVACUATION;  Surgeon: Purcell Nails, MD;  Location: [facility];  Service: Gynecology;  Laterality: N/A;   No family history on file. History  Substance Use Topics  . Smoking status: Never Smoker   . Smokeless tobacco: Never Used  . Alcohol Use: Yes     Comment: socially   OB History   Grav Para Term Preterm Abortions TAB SAB Ect Mult Living   2 1 1       1      Review of Systems  Constitutional: Negative for fever, diaphoresis, appetite change, fatigue and unexpected weight change.  HENT: Negative for mouth sores and neck stiffness.   Eyes: Negative for visual disturbance.  Respiratory: Negative for cough, chest tightness, shortness of breath and wheezing.   Cardiovascular: Negative for chest pain.  Gastrointestinal: Positive for nausea and vomiting. Negative for abdominal pain, diarrhea and constipation.  Endocrine: Negative for polydipsia, polyphagia and polyuria.  Genitourinary: Negative for dysuria, urgency, frequency and hematuria.  Musculoskeletal: Negative for back pain.  Skin: Negative for rash.  Allergic/Immunologic: Negative for immunocompromised state.  Neurological: Positive for headaches. Negative for syncope and light-headedness.  Hematological: Does not bruise/bleed easily.  Psychiatric/Behavioral: Negative for sleep disturbance. The patient is not nervous/anxious.     Allergies  Review of patient's allergies indicates no known allergies.  Home Medications   Current Outpatient Rx  Name  Route  Sig  Dispense  Refill  . HYDROcodone-acetaminophen (NORCO/VICODIN) 5-325 MG per tablet   Oral   Take 2 tablets by mouth every 6 (six) hours as needed for pain.   15 tablet   0   . ibuprofen (ADVIL,MOTRIN) 800 MG tablet   Oral   Take 1 tablet (800 mg total) by mouth 3 (three) times daily.   21 tablet   0   . oxyCODONE-acetaminophen (PERCOCET/ROXICET) 5-325 MG per  tablet   Oral   Take 1-2 tablets by mouth every 6 (six) hours as needed for pain.   20 tablet   0   . traMADol (ULTRAM) 50 MG tablet   Oral   Take 1 tablet (50 mg total) by mouth every 8 (eight) hours as needed for pain.   5 tablet   0    BP 131/68  Pulse 79  Temp(Src) 99.3 F (37.4 C) (Oral)  Resp 12  Ht 5\' 3"  (1.6 m)  SpO2 100%  LMP 10/18/2012 Physical Exam  Nursing note and vitals reviewed. Constitutional: She is oriented to person, place, and time. She appears well-developed and well-nourished. No distress.  HENT:  Head: Normocephalic and atraumatic.  Right Ear: Hearing, tympanic membrane, external ear and ear canal normal.  Left Ear: Hearing, tympanic membrane, external ear and ear canal normal.  Nose: Nose normal. No mucosal edema.  Mouth/Throat: Uvula is midline, oropharynx is clear and moist and mucous membranes are normal. Mucous membranes are not dry. No edematous. No oropharyngeal exudate, posterior oropharyngeal edema, posterior oropharyngeal erythema or tonsillar abscesses.  Eyes: Conjunctivae and EOM are normal. Pupils are equal, round, and reactive to light. No scleral icterus.  Neck: Normal range of motion and full passive range of motion without pain. Neck supple. Muscular tenderness present. No spinous process tenderness present. No rigidity. Normal range of motion present. No Brudzinski's sign and no Kernig's sign noted.  Full range of motion without pain Mild tenderness to palpation of the right paraspinal muscle. No nuchal rigidity  Cardiovascular: Normal rate, regular rhythm, S1 normal, S2 normal, normal heart sounds and intact distal pulses.   Pulses:      Radial pulses are 2+ on the right side, and 2+ on the left side.       Dorsalis pedis pulses are 2+ on the right side, and 2+ on the left side.       Posterior tibial pulses are 2+ on the right side, and 2+ on the left side.  Pulmonary/Chest: Effort normal and breath sounds normal. No accessory muscle  usage. Not tachypneic. No respiratory distress. She has no decreased breath sounds. She has no wheezes. She has no rhonchi. She has no rales.  Abdominal: Soft. Normal appearance and bowel sounds are normal. There is no tenderness. There is no rebound, no guarding and no CVA tenderness.  Musculoskeletal: Normal range of motion.  Lymphadenopathy:    She has no cervical adenopathy.  Neurological: She is alert and oriented to person, place, and time. She has normal reflexes. No cranial nerve deficit. She exhibits normal muscle tone. Coordination normal.  Speech is clear and goal oriented, follows commands Cranial nerves III - XII without deficit, no facial droop Normal strength in upper and lower extremities bilaterally, strong and equal grip strength Sensation normal to light and sharp touch Moves extremities without ataxia, coordination intact Normal finger to nose and rapid alternating movements Neg romberg, no pronator drift Normal gait Normal heel-shin and balance   Skin: Skin is warm and  dry. No rash noted. She is not diaphoretic. No erythema.  Psychiatric: She has a normal mood and affect. Her behavior is normal. Judgment and thought content normal.    ED Course  Procedures (including critical care time) Labs Reviewed - No data to display No results found. 1. Migraine headache     MDM  Lisbeth Renshaw presents with headache.  Pt HA treated with migraine cocktail and resolved along with nausea and vomiting while in ED.  Presentation is not like previous headache and non concerning for [facility], ICH, Meningitis, or temporal arteritis. Discussed the patient the small possibility of bacterial meningitis from previous LP and offered repeat LP today for which pt declined.  Pt is afebrile with no focal neuro deficits, nuchal rigidity, or change in vision. I do not believe this a recurrence of her meningitis.  Pt is to follow up with PCP to discuss prophylactic medication. Pt verbalizes  understanding and is agreeable with plan to dc. I have also discussed reasons to return immediately to the ER.  Patient expresses understanding and agrees with plan.      Dahlia Client Clinton Dragone, PA-C 11/12/12 2031

## 2012-11-12 NOTE — ED Provider Notes (Signed)
Medical screening examination/treatment/procedure(s) were performed by non-physician practitioner and as supervising physician I was immediately available for consultation/collaboration.   Gerhard Munch, MD 11/12/12 2213

## 2012-11-12 NOTE — ED Notes (Signed)
Pt states headache began last night and was unrelieved by ibuprofen and Vicodin. Pt also states she took a Vicodin this morning with still no relief, has not been able to eat anything due to vomiting episodes (x4 today). Pt also c/o fatigue since the headache began.

## 2013-04-20 ENCOUNTER — Inpatient Hospital Stay (HOSPITAL_COMMUNITY)
Admission: AD | Admit: 2013-04-20 | Discharge: 2013-04-20 | Disposition: A | Discharge status: Home / Self Care | Payer: Managed Care, Other (non HMO) | Source: Ambulatory Visit | Attending: Obstetrics and Gynecology | Admitting: Obstetrics and Gynecology

## 2013-04-20 ENCOUNTER — Encounter (HOSPITAL_COMMUNITY): Payer: Self-pay | Admitting: *Deleted

## 2013-04-20 DIAGNOSIS — N898 Other specified noninflammatory disorders of vagina: Secondary | ICD-10-CM | POA: Insufficient documentation

## 2013-04-20 DIAGNOSIS — Z3201 Encounter for pregnancy test, result positive: Secondary | ICD-10-CM | POA: Insufficient documentation

## 2013-04-20 DIAGNOSIS — L293 Anogenital pruritus, unspecified: Secondary | ICD-10-CM | POA: Insufficient documentation

## 2013-04-20 LAB — URINALYSIS, ROUTINE W REFLEX MICROSCOPIC
Glucose, UA: NEGATIVE mg/dL
Hgb urine dipstick: NEGATIVE
Protein, ur: 30 mg/dL — AB
pH: 7.5 (ref 5.0–8.0)

## 2013-04-20 LAB — URINE MICROSCOPIC-ADD ON

## 2013-04-20 LAB — WET PREP, GENITAL
Clue Cells Wet Prep HPF POC: NONE SEEN
Trich, Wet Prep: NONE SEEN

## 2013-04-20 LAB — POCT PREGNANCY, URINE: Preg Test, Ur: POSITIVE — AB

## 2013-04-20 NOTE — MAU Note (Signed)
Patient presents with complaints of vaginal itching/discharge today.

## 2013-04-20 NOTE — MAU Provider Note (Signed)
History   27yo G2P1 presents with white thick vaginal discharge and irritation since this am.  Denies VB, recent fever, resp or GI c/o's, UTI s/s.  Pt reports unprotected intercourse and no current BC method.  Chief Complaint  Patient presents with  . Vaginal Discharge  . Vaginal Itching   HPI  OB History   Grav Para Term Preterm Abortions TAB SAB Ect Mult Living   2 1 1  1  1   1       Past Medical History  Diagnosis Date  . Obesity   . Bronchitis   . Hand laceration     lt  . SVD (spontaneous vaginal delivery)     x 1  . Bronchitis     uses inhaler prn - rarely uses  . Meningitis     Past Surgical History  Procedure Laterality Date  . Wound exploration  03/14/2012    Procedure: WOUND EXPLORATION;  Surgeon: Tami Ribas, MD;  Location: [facility];  Service: Orthopedics;  Laterality: Left;  Left Hand Repair Flexor Tendon, Possible Nerve/Artery Repair , debridement of tendon  . Left hand surgery      tendon repair  . Dilation and evacuation N/A 08/16/2012    Procedure: DILATATION AND EVACUATION;  Surgeon: Purcell Nails, MD;  Location: [facility];  Service: Gynecology;  Laterality: N/A;    History reviewed. No pertinent family history.  History  Substance Use Topics  . Smoking status: Never Smoker   . Smokeless tobacco: Never Used  . Alcohol Use: Yes     Comment: socially    Allergies: No Known Allergies  Prescriptions prior to admission  Medication Sig Dispense Refill  . Pediatric Multivit-Minerals-C (FLINTSTONES GUMMIES PLUS PO) Take 2 each by mouth daily.        ROS ROS: see HPI above, all other systems are negative  Physical Exam   Blood pressure 122/72, pulse 80, temperature 98.4 F (36.9 C), temperature source Oral, resp. rate 16, height 5\' 3"  (1.6 m), weight 220 lb (99.791 kg), last menstrual period 03/23/2013.  Physical Exam Chest: Clear Heart: RRR Abdomen: gravid, NT Extremities: WNL  Pelvic exam: vaginal discharge - white,  curd-like and thick;  normal external genitalia, vulva, vagina, cervix, uterus and adnexa.  Cervix thick and closed   ED Course  Vaginal discharge and irritation  Pregnancy test - positive Wet prep - pending GC/CT - pending  Will call pt with results and potential rx D/c patient home with precautions Encouraged to call [facility] asap for a NOB interview appointment    Haroldine Laws CNM, MSN 04/20/2013 2:00 PM

## 2013-04-21 LAB — GC/CHLAMYDIA PROBE AMP: CT Probe RNA: NEGATIVE

## 2013-04-21 LAB — URINE CULTURE

## 2013-04-24 NOTE — L&D Delivery Note (Signed)
Delivery Note  Called for delivery.  Patient was C/C/+2. With two pushes, at 11:33 PM a viable female was delivered via Vaginal, Spontaneous Delivery (Presentation: ; Occiput Anterior).  APGAR: 9, 9; weight pending.   Placenta status: Intact, Spontaneous.   Cord:  with the following complications: None.     Anesthesia: Epidural  Episiotomy: None Lacerations: None Suture Repair: n/a Est. Blood Loss (mL): 300  Mom to postpartum.  Baby to Couplet care / Skin to Skin.  Jerelyn Charles 12/20/2013, 11:46 PM

## 2013-05-01 ENCOUNTER — Inpatient Hospital Stay (HOSPITAL_COMMUNITY)
Admission: AD | Admit: 2013-05-01 | Discharge: 2013-05-01 | Disposition: A | Discharge status: Home / Self Care | Payer: Managed Care, Other (non HMO) | Source: Ambulatory Visit | Attending: Obstetrics & Gynecology | Admitting: Obstetrics & Gynecology

## 2013-05-01 ENCOUNTER — Encounter (HOSPITAL_COMMUNITY): Payer: Self-pay | Admitting: *Deleted

## 2013-05-01 DIAGNOSIS — R42 Dizziness and giddiness: Secondary | ICD-10-CM | POA: Insufficient documentation

## 2013-05-01 DIAGNOSIS — O9989 Other specified diseases and conditions complicating pregnancy, childbirth and the puerperium: Principal | ICD-10-CM

## 2013-05-01 DIAGNOSIS — O99891 Other specified diseases and conditions complicating pregnancy: Secondary | ICD-10-CM | POA: Insufficient documentation

## 2013-05-01 DIAGNOSIS — R51 Headache: Secondary | ICD-10-CM | POA: Insufficient documentation

## 2013-05-01 DIAGNOSIS — R519 Headache, unspecified: Secondary | ICD-10-CM

## 2013-05-01 DIAGNOSIS — O26899 Other specified pregnancy related conditions, unspecified trimester: Secondary | ICD-10-CM

## 2013-05-01 LAB — COMPREHENSIVE METABOLIC PANEL
ALT: 7 U/L (ref 0–35)
AST: 12 U/L (ref 0–37)
Albumin: 3.8 g/dL (ref 3.5–5.2)
Alkaline Phosphatase: 66 U/L (ref 39–117)
BUN: 10 mg/dL (ref 6–23)
CALCIUM: 9.5 mg/dL (ref 8.4–10.5)
CO2: 23 mEq/L (ref 19–32)
Chloride: 103 mEq/L (ref 96–112)
Creatinine, Ser: 0.64 mg/dL (ref 0.50–1.10)
GFR calc non Af Amer: >90 mL/min (ref >90)
Glucose, Bld: 89 mg/dL (ref 70–99)
Potassium: 4.5 mEq/L (ref 3.7–5.3)
Sodium: 138 mEq/L (ref 137–147)
TOTAL PROTEIN: 7.7 g/dL (ref 6.0–8.3)
Total Bilirubin: 0.5 mg/dL (ref 0.3–1.2)

## 2013-05-01 LAB — CBC
HEMATOCRIT: 34.6 % — AB (ref 36.0–46.0)
Hemoglobin: 10.9 g/dL — ABNORMAL LOW (ref 12.0–15.0)
MCH: 24 pg — AB (ref 26.0–34.0)
MCHC: 31.5 g/dL (ref 30.0–36.0)
MCV: 76.2 fL — ABNORMAL LOW (ref 78.0–100.0)
Platelets: 265 10*3/uL (ref 150–400)
RBC: 4.54 MIL/uL (ref 3.87–5.11)
RDW: 15.3 % (ref 11.5–15.5)
WBC: 8.6 10*3/uL (ref 4.0–10.5)

## 2013-05-01 LAB — URINALYSIS, ROUTINE W REFLEX MICROSCOPIC
BILIRUBIN URINE: NEGATIVE
Glucose, UA: NEGATIVE mg/dL
Hgb urine dipstick: NEGATIVE
KETONES UR: NEGATIVE mg/dL
Leukocytes, UA: NEGATIVE
NITRITE: NEGATIVE
PH: 6.5 (ref 5.0–8.0)
PROTEIN: NEGATIVE mg/dL
Specific Gravity, Urine: 1.02 (ref 1.005–1.030)
Urobilinogen, UA: 2 mg/dL — ABNORMAL HIGH (ref 0.0–1.0)

## 2013-05-01 LAB — HCG, QUANTITATIVE, PREGNANCY: HCG, BETA CHAIN, QUANT, S: 8810 m[IU]/mL — AB (ref <5)

## 2013-05-01 MED ORDER — BUTALBITAL-APAP-CAFFEINE 50-325-40 MG PO TABS: 1.0000 | ORAL_TABLET | Freq: Four times a day (QID) | ORAL | Status: DC | PRN

## 2013-05-01 MED ORDER — BUTALBITAL-APAP-CAFFEINE 50-325-40 MG PO TABS
2.0000 | ORAL_TABLET | Freq: Once | ORAL | Status: AC
  Administered 2013-05-01: 2 via ORAL
  Filled 2013-05-01: qty 2

## 2013-05-01 MED ORDER — ONDANSETRON 8 MG PO TBDP
8.0000 mg | ORAL_TABLET | Freq: Once | ORAL | Status: AC
  Administered 2013-05-01: 8 mg via ORAL
  Filled 2013-05-01: qty 1

## 2013-05-01 NOTE — MAU Provider Note (Signed)
History     CSN: [id number]  Arrival date and time: 05/01/13 3419   First Provider Initiated Contact with Patient 05/01/13 1108      Chief Complaint  Patient presents with  . Dizziness  . Headache   HPI Tara Bender is a 28 year old G34P1011 female at [redacted]w[redacted]d c/o intermittent headache, dizziness, and vision changes since she woke up Tuesday morning.  Headache is right-sided, intermittent and rated as a 5/10 currently.  She has not taken anything pain - as she does not like to take medication and has trouble swallowing pills.  History of aseptic meningitis in July 2014.  Recalls childhood history of headache for which she took naproxen.  This headache does not feel new or different to her than her average headache - just persistent.  Describes dizziness as feeling off-balance - denies room-spinning or falls.  Vision changes are described as spots in field of vision and blurriness.  Also experiencing mild fatigue and nausea x weeks.  Denies scotoma, flashes, floaters, black curtains, nuchal rigidity, weakness, paresthesias, vomiting, abdominal pain, vaginal bleeding, vaginal d/c, dysuria.  No other significant PMH.  Taking flintstone gummy vitamins.  NKDA.  No significant FH.  Patient noted she was hospitalized during 1st pregnancy for dehydration and was diagnosed with anemia.  Does not feel dehydrated today.  Past Medical History  Diagnosis Date  . Obesity   . Bronchitis   . Hand laceration     lt  . SVD (spontaneous vaginal delivery)     x 1  . Bronchitis     uses inhaler prn - rarely uses  . Meningitis     Past Surgical History  Procedure Laterality Date  . Wound exploration  03/14/2012    Procedure: WOUND EXPLORATION;  Surgeon: Tennis Must, MD;  Location: [facility];  Service: Orthopedics;  Laterality: Left;  Left Hand Repair Flexor Tendon, Possible Nerve/Artery Repair , debridement of tendon  . Left hand surgery      tendon repair  . Dilation and evacuation  N/A 08/16/2012    Procedure: DILATATION AND EVACUATION;  Surgeon: Delice Lesch, MD;  Location: [facility];  Service: Gynecology;  Laterality: N/A;    History reviewed. No pertinent family history.  History  Substance Use Topics  . Smoking status: Never Smoker   . Smokeless tobacco: Never Used  . Alcohol Use: Yes     Comment: socially- DRINKS WINE- LAST TIME - NOV    Allergies: No Known Allergies  Prescriptions prior to admission  Medication Sig Dispense Refill  . Pediatric Multivit-Minerals-C (FLINTSTONES GUMMIES PLUS PO) Take 2 each by mouth daily.        Review of Systems  Constitutional: Positive for malaise/fatigue. Negative for fever and chills.  Eyes: Positive for blurred vision. Negative for pain.  Gastrointestinal: Positive for nausea. Negative for vomiting and abdominal pain.  Genitourinary: Negative for dysuria.  Musculoskeletal: Negative for neck pain.  Neurological: Positive for dizziness and headaches. Negative for sensory change, speech change, focal weakness, loss of consciousness and weakness.   Physical Exam   Blood pressure 125/66, pulse 91, temperature 99.2 F (37.3 C), temperature source Oral, resp. rate 18, height 5\' 3"  (1.6 m), weight 107.14 kg (236 lb 3.2 oz), last menstrual period 03/23/2013.  Physical Exam  Constitutional: She is oriented to person, place, and time. She appears well-developed and well-nourished. No distress.  HENT:  Head: Normocephalic and atraumatic.  Moist mucous membranes.  Eyes: EOM are normal. Pupils are equal,  round, and reactive to light.  Neck:  Neck - Full ROM without pain.  Cardiovascular: Normal rate, regular rhythm and normal heart sounds.   Respiratory: No respiratory distress.  GI: Soft. Bowel sounds are normal. She exhibits no distension. There is no tenderness. There is no rebound and no guarding.  Neurological: She is alert and oriented to person, place, and time. No cranial nerve deficit.  Skin: Skin is warm and  dry. No rash noted.  Good turgor.    MAU Course  Procedures  MDM CBC, CMP, HCG Quant, UA, Urine Preg Results for orders placed during the hospital encounter of 05/01/13 (from the past 24 hour(s))  URINALYSIS, ROUTINE W REFLEX MICROSCOPIC     Status: Abnormal   Collection Time    05/01/13 10:30 AM      Result Value Range   Color, Urine YELLOW  YELLOW   APPearance CLEAR  CLEAR   Specific Gravity, Urine 1.020  1.005 - 1.030   pH 6.5  5.0 - 8.0   Glucose, UA NEGATIVE  NEGATIVE mg/dL   Hgb urine dipstick NEGATIVE  NEGATIVE   Bilirubin Urine NEGATIVE  NEGATIVE   Ketones, ur NEGATIVE  NEGATIVE mg/dL   Protein, ur NEGATIVE  NEGATIVE mg/dL   Urobilinogen, UA 2.0 (*) 0.0 - 1.0 mg/dL   Nitrite NEGATIVE  NEGATIVE   Leukocytes, UA NEGATIVE  NEGATIVE  HCG, QUANTITATIVE, PREGNANCY     Status: Abnormal   Collection Time    05/01/13 11:34 AM      Result Value Range   hCG, Beta Chain, Quant, S 8810 (*) <5 mIU/mL  CBC     Status: Abnormal   Collection Time    05/01/13 11:35 AM      Result Value Range   WBC 8.6  4.0 - 10.5 K/uL   RBC 4.54  3.87 - 5.11 MIL/uL   Hemoglobin 10.9 (*) 12.0 - 15.0 g/dL   HCT 34.6 (*) 36.0 - 46.0 %   MCV 76.2 (*) 78.0 - 100.0 fL   MCH 24.0 (*) 26.0 - 34.0 pg   MCHC 31.5  30.0 - 36.0 g/dL   RDW 15.3  11.5 - 15.5 %   Platelets 265  150 - 400 K/uL  COMPREHENSIVE METABOLIC PANEL     Status: None   Collection Time    05/01/13 11:35 AM      Result Value Range   Sodium 138  137 - 147 mEq/L   Potassium 4.5  3.7 - 5.3 mEq/L   Chloride 103  96 - 112 mEq/L   CO2 23  19 - 32 mEq/L   Glucose, Bld 89  70 - 99 mg/dL   BUN 10  6 - 23 mg/dL   Creatinine, Ser 0.64  0.50 - 1.10 mg/dL   Calcium 9.5  8.4 - 10.5 mg/dL   Total Protein 7.7  6.0 - 8.3 g/dL   Albumin 3.8  3.5 - 5.2 g/dL   AST 12  0 - 37 U/L   ALT 7  0 - 35 U/L   Alkaline Phosphatase 66  39 - 117 U/L   Total Bilirubin 0.5  0.3 - 1.2 mg/dL   GFR calc non Af Amer >90  >90 mL/min   GFR calc Af Amer >90  >90  mL/min  headache resolved with Fioricet and zofran Assessment and Plan  Headache in pregnancy Fioricet RX  Tara Bender 05/01/2013, 11:23 AM

## 2013-05-01 NOTE — MAU Provider Note (Signed)
Attestation of Attending Supervision of Advanced Practitioner (CNM/NP): Evaluation and management procedures were performed by the Advanced Practitioner under my supervision and collaboration.  I have reviewed the Advanced Practitioner's note and chart, and I agree with the management and plan.  HARRAWAY-SMITH, Emilyann Banka 6:23 PM

## 2013-05-01 NOTE — MAU Note (Signed)
PT SAYS  ON Tuesday- WHEN SHE AWOKE AT 0600-  HER HEAD WAS HURTING  ON  TOP.  PAIN WOULD COME AND GO- NO MEDS.   SHE BECOMES DIZZY  AND  SEES SPOTS AND  VISION BLURRY.    SAYS HEADACHE  NOW IS SAME AS Tuesday.      DENIES BLURRY NOW- BUT SEES SPOTS IN LEFT EYE.     FEELS  NAUSEA.    WITH OTHER PREG- WENT TO [facility] FOR P[facility]- BUT  WANTS TO GO TO [place] FOR THIS PREG- SHE CALLED THEM TODAY - BUT   THEY TOLD HER TO CALL BACK  FOR FEB.   DENIES VOIDING S/S.   SAYS WAS HERE 2 WEEKS AGO FOR YEAST INFECTION-  BUT ONLY  TOOK 1/2  MED.    DENIES VAG BLEEDING.        SAYS SHE GETS H/A ALL THE TIME- USUALLY  TAKES IBUPROFEN,  WHEN SHE WAS YOUNGER- SHE TOOK RX MED.      THIS  IS NOT WORSE H/A IN LIFE-  BUT DOES NOT USUALLY GET DIZZY.

## 2013-05-01 NOTE — MAU Note (Signed)
Pt reports she has been having dizziness on and of all weak. Feeling light headed and her vision going in and out. C/O headache this morning.

## 2013-05-28 ENCOUNTER — Other Ambulatory Visit: Payer: Self-pay

## 2013-05-28 LAB — OB RESULTS CONSOLE GC/CHLAMYDIA
Chlamydia: NEGATIVE
Gonorrhea: NEGATIVE

## 2013-06-25 ENCOUNTER — Inpatient Hospital Stay (HOSPITAL_COMMUNITY)
Admission: AD | Admit: 2013-06-25 | Discharge: 2013-06-25 | Disposition: A | Discharge status: Home / Self Care | Payer: Managed Care, Other (non HMO) | Source: Ambulatory Visit | Attending: Obstetrics and Gynecology | Admitting: Obstetrics and Gynecology

## 2013-06-25 ENCOUNTER — Encounter (HOSPITAL_COMMUNITY): Payer: Self-pay | Admitting: *Deleted

## 2013-06-25 DIAGNOSIS — M542 Cervicalgia: Secondary | ICD-10-CM | POA: Insufficient documentation

## 2013-06-25 DIAGNOSIS — G44209 Tension-type headache, unspecified, not intractable: Secondary | ICD-10-CM

## 2013-06-25 DIAGNOSIS — O99891 Other specified diseases and conditions complicating pregnancy: Secondary | ICD-10-CM | POA: Insufficient documentation

## 2013-06-25 DIAGNOSIS — H53149 Visual discomfort, unspecified: Secondary | ICD-10-CM | POA: Insufficient documentation

## 2013-06-25 DIAGNOSIS — M549 Dorsalgia, unspecified: Secondary | ICD-10-CM | POA: Insufficient documentation

## 2013-06-25 DIAGNOSIS — O9989 Other specified diseases and conditions complicating pregnancy, childbirth and the puerperium: Principal | ICD-10-CM

## 2013-06-25 LAB — CBC
HCT: 32.8 % — ABNORMAL LOW (ref 36.0–46.0)
Hemoglobin: 10.9 g/dL — ABNORMAL LOW (ref 12.0–15.0)
MCH: 25.2 pg — AB (ref 26.0–34.0)
MCHC: 33.2 g/dL (ref 30.0–36.0)
MCV: 75.9 fL — ABNORMAL LOW (ref 78.0–100.0)
Platelets: 201 10*3/uL (ref 150–400)
RBC: 4.32 MIL/uL (ref 3.87–5.11)
RDW: 15.4 % (ref 11.5–15.5)
WBC: 8.6 10*3/uL (ref 4.0–10.5)

## 2013-06-25 LAB — COMPREHENSIVE METABOLIC PANEL
ALT: 10 U/L (ref 0–35)
AST: 13 U/L (ref 0–37)
Albumin: 3.2 g/dL — ABNORMAL LOW (ref 3.5–5.2)
Alkaline Phosphatase: 57 U/L (ref 39–117)
BUN: 9 mg/dL (ref 6–23)
CALCIUM: 9.3 mg/dL (ref 8.4–10.5)
CHLORIDE: 99 meq/L (ref 96–112)
CO2: 23 mEq/L (ref 19–32)
Creatinine, Ser: 0.58 mg/dL (ref 0.50–1.10)
GFR calc Af Amer: >90 mL/min (ref >90)
Glucose, Bld: 94 mg/dL (ref 70–99)
Potassium: 3.8 mEq/L (ref 3.7–5.3)
SODIUM: 135 meq/L — AB (ref 137–147)
Total Bilirubin: <0.2 mg/dL — ABNORMAL LOW (ref 0.3–1.2)
Total Protein: 7.2 g/dL (ref 6.0–8.3)

## 2013-06-25 LAB — URINALYSIS, ROUTINE W REFLEX MICROSCOPIC
Bilirubin Urine: NEGATIVE
Glucose, UA: NEGATIVE mg/dL
Hgb urine dipstick: NEGATIVE
Ketones, ur: NEGATIVE mg/dL
LEUKOCYTES UA: NEGATIVE
Nitrite: NEGATIVE
Protein, ur: NEGATIVE mg/dL
UROBILINOGEN UA: 1 mg/dL (ref 0.0–1.0)
pH: 6 (ref 5.0–8.0)

## 2013-06-25 MED ORDER — OXYCODONE-ACETAMINOPHEN 5-325 MG PO TABS: 1.0000 | ORAL_TABLET | Freq: Once | ORAL | Status: DC

## 2013-06-25 MED ORDER — CYCLOBENZAPRINE HCL 10 MG PO TABS: 10.0000 mg | ORAL_TABLET | Freq: Once | ORAL | Status: DC

## 2013-06-25 MED ORDER — OXYCODONE-ACETAMINOPHEN 5-325 MG PO TABS
1.0000 | ORAL_TABLET | Freq: Four times a day (QID) | ORAL | Status: DC | PRN
Start: 2013-06-25 — End: 2013-08-20

## 2013-06-25 MED ORDER — CYCLOBENZAPRINE HCL 10 MG PO TABS: 10.0000 mg | ORAL_TABLET | Freq: Two times a day (BID) | ORAL | Status: DC | PRN

## 2013-06-25 NOTE — MAU Note (Signed)
Really bad headache since Friday.  Neck started hurting and getting stiff today. ( reports hx of meningitis). Back pain started yesterday, low back to rt. Took tylenol for HA, minimal relief.

## 2013-06-25 NOTE — MAU Provider Note (Signed)
History     CSN: [id number]  Arrival date and time: 06/25/13 1642   First Provider Initiated Contact with Patient 06/25/13 1717      Chief Complaint  Patient presents with  . Headache  . Back Pain  . Neck Pain   HPI [name] is a 28 y.o. G3P1011 at [redacted]w[redacted]d who presents to MAU today with complaint of headache and neck pain. The patient has history of meningitis and states that this does not feel like that. She rates her headache at 10/10 now. She has taken Tylenol without relief. She denies blurred vision or floaters. She does have mild photophobia. She describes neck pain in the occipital region. She has had nausea with occasional vomiting. She denies fever, UTI symptoms, abdomina pain, vaginal bleeding or discharge.   OB History   Grav Para Term Preterm Abortions TAB SAB Ect Mult Living   3 1 1  1  1   1       Past Medical History  Diagnosis Date  . Obesity   . Bronchitis   . Hand laceration     lt  . SVD (spontaneous vaginal delivery)     x 1  . Bronchitis     uses inhaler prn - rarely uses  . Meningitis     Past Surgical History  Procedure Laterality Date  . Wound exploration  03/14/2012    Procedure: WOUND EXPLORATION;  Surgeon: Tennis Must, MD;  Location: [place];  Service: Orthopedics;  Laterality: Left;  Left Hand Repair Flexor Tendon, Possible Nerve/Artery Repair , debridement of tendon  . Left hand surgery      tendon repair  . Dilation and evacuation N/A 08/16/2012    Procedure: DILATATION AND EVACUATION;  Surgeon: Delice Lesch, MD;  Location: [facility];  Service: Gynecology;  Laterality: N/A;    History reviewed. No pertinent family history.  History  Substance Use Topics  . Smoking status: Never Smoker   . Smokeless tobacco: Never Used  . Alcohol Use: Yes     Comment: socially- DRINKS WINE- LAST TIME - NOV    Allergies: No Known Allergies  Prescriptions prior to admission  Medication Sig Dispense Refill  .  acetaminophen (TYLENOL) 500 MG tablet Take 1,000 mg by mouth every 6 (six) hours as needed for headache.      . ondansetron (ZOFRAN) 4 MG tablet Take 4 mg by mouth every 8 (eight) hours as needed for nausea or vomiting.      . Pediatric Multivit-Minerals-C (FLINTSTONES GUMMIES PLUS PO) Take 2 each by mouth daily.        Review of Systems  Constitutional: Negative for fever, chills and malaise/fatigue.  Eyes: Negative for blurred vision and double vision.  Gastrointestinal: Positive for nausea. Negative for vomiting, abdominal pain, diarrhea and constipation.  Genitourinary: Negative for dysuria, urgency (Neg - vaginal bleeding, discharge) and frequency.  Musculoskeletal: Positive for neck pain.  Neurological: Positive for headaches.   Physical Exam   Blood pressure 115/69, pulse 91, temperature 99.3 F (37.4 C), temperature source Oral, resp. rate 18, last menstrual period 03/23/2013.  Physical Exam  Constitutional: She is oriented to person, place, and time. She appears well-developed and well-nourished. No distress.  HENT:  Head: Normocephalic and atraumatic.  Neck: Normal range of motion. Neck supple. No spinous process tenderness and no muscular tenderness present. No edema and normal range of motion present. No Brudzinski's sign and no Kernig's sign noted.  Cardiovascular: Normal rate.  Respiratory: Effort normal.  Musculoskeletal:       Cervical back: She exhibits normal range of motion, no tenderness, no bony tenderness, no swelling, no edema and no pain.       Thoracic back: She exhibits normal range of motion, no tenderness, no bony tenderness, no swelling, no edema and no pain.       Lumbar back: She exhibits tenderness. She exhibits normal range of motion, no bony tenderness, no swelling, no edema, no deformity and no laceration.  Neurological: She is alert and oriented to person, place, and time.  Skin: Skin is warm and dry. No erythema.  Psychiatric: She has a normal mood  and affect.   Results for orders placed during the hospital encounter of 06/25/13 (from the past 24 hour(s))  URINALYSIS, ROUTINE W REFLEX MICROSCOPIC     Status: Abnormal   Collection Time    06/25/13  5:00 PM      Result Value Ref Range   Color, Urine YELLOW  YELLOW   APPearance CLEAR  CLEAR   Specific Gravity, Urine >1.030 (*) 1.005 - 1.030   pH 6.0  5.0 - 8.0   Glucose, UA NEGATIVE  NEGATIVE mg/dL   Hgb urine dipstick NEGATIVE  NEGATIVE   Bilirubin Urine NEGATIVE  NEGATIVE   Ketones, ur NEGATIVE  NEGATIVE mg/dL   Protein, ur NEGATIVE  NEGATIVE mg/dL   Urobilinogen, UA 1.0  0.0 - 1.0 mg/dL   Nitrite NEGATIVE  NEGATIVE   Leukocytes, UA NEGATIVE  NEGATIVE  CBC     Status: Abnormal   Collection Time    06/25/13  5:50 PM      Result Value Ref Range   WBC 8.6  4.0 - 10.5 K/uL   RBC 4.32  3.87 - 5.11 MIL/uL   Hemoglobin 10.9 (*) 12.0 - 15.0 g/dL   HCT 32.8 (*) 36.0 - 46.0 %   MCV 75.9 (*) 78.0 - 100.0 fL   MCH 25.2 (*) 26.0 - 34.0 pg   MCHC 33.2  30.0 - 36.0 g/dL   RDW 15.4  11.5 - 15.5 %   Platelets 201  150 - 400 K/uL  COMPREHENSIVE METABOLIC PANEL     Status: Abnormal   Collection Time    06/25/13  5:50 PM      Result Value Ref Range   Sodium 135 (*) 137 - 147 mEq/L   Potassium 3.8  3.7 - 5.3 mEq/L   Chloride 99  96 - 112 mEq/L   CO2 23  19 - 32 mEq/L   Glucose, Bld 94  70 - 99 mg/dL   BUN 9  6 - 23 mg/dL   Creatinine, Ser 0.58  0.50 - 1.10 mg/dL   Calcium 9.3  8.4 - 10.5 mg/dL   Total Protein 7.2  6.0 - 8.3 g/dL   Albumin 3.2 (*) 3.5 - 5.2 g/dL   AST 13  0 - 37 U/L   ALT 10  0 - 35 U/L   Alkaline Phosphatase 57  39 - 117 U/L   Total Bilirubin <0.2 (*) 0.3 - 1.2 mg/dL   GFR calc non Af Amer >90  >90 mL/min   GFR calc Af Amer >90  >90 mL/min    MAU Course  Procedures None  MDM FHR - 153 bpm with doppler Discussed patient with Dr. Philis Pique. Check CBC, CMP and order Flexeril and pain medicine for headache.  Patient is driving today and has no one to pick her  up. Discussed with Dr. Philis Pique.  [facility] for discharge with Rx for Percocet and Flexeril to be taken at home for headache.  Discussed plan with patient who verbalizes understanding.  Assessment and Plan  A: Tension headache  P: Discharge home Rx for Flexeril and Percocet given to patient Advised increased PO hydration as tolerated Patient advised to follow-up with [facility] as scheduled Patient may return to MAU as needed or if her condition were to change or worsen  Farris Has, PA-C  06/25/2013, 6:26 PM

## 2013-06-25 NOTE — Discharge Instructions (Signed)
Tension Headache  A tension headache is pain, pressure, or aching felt over the front and sides of the head. Tension headaches often come after stress, feeling worried (anxiety), or feeling sad or down for a while (depressed).  HOME CARE  · Only take medicine as told by your doctor.  · Lie down in a dark, quiet room when you have a headache.  · Keep a journal to find out if certain things bring on headaches. For example, write down:  · What you eat and drink.  · How much sleep you get.  · Any change to your diet or medicines.  · Relax by getting a massage or doing other relaxing activities.  · Put ice or heat packs on the head and neck area as told by your doctor.  · Lessen stress.  · Sit up straight. Do not tighten (tense) your muscles.  · Quit smoking if you smoke.  · Lessen how much alcohol you drink.  · Lessen how much caffeine you drink, or stop drinking caffeine.  · Eat and exercise regularly.  · Get enough sleep.  · Avoid using too much pain medicine.  GET HELP RIGHT AWAY IF:   · Your headache becomes really bad.  · You have a fever.  · You have a stiff neck.  · You have trouble seeing.  · Your muscles are weak, or you lose muscle control.  · You lose your balance or have trouble walking.  · You feel like you will pass out (faint), or you pass out.  · You have really bad symptoms that are different than your first symptoms.  · You have problems with the medicines given to you by your doctor.  · Your medicines do not work.  · Your headache feels different than the other headaches.  · You feel sick to your stomach (nauseous) or throw up (vomit).  MAKE SURE YOU:   · Understand these instructions.  · Will watch your condition.  · Will get help right away if you are not doing well or get worse.  Document Released: 07/05/2009 Document Revised: 07/03/2011 Document Reviewed: 03/31/2011  Exit[employer]® Patient Information ©2014 [employer].

## 2013-07-03 LAB — OB RESULTS CONSOLE ABO/RH: RH TYPE: POSITIVE

## 2013-07-03 LAB — OB RESULTS CONSOLE RPR: RPR: NONREACTIVE

## 2013-07-03 LAB — OB RESULTS CONSOLE HEPATITIS B SURFACE ANTIGEN: HEP B S AG: NEGATIVE

## 2013-07-03 LAB — OB RESULTS CONSOLE HIV ANTIBODY (ROUTINE TESTING): HIV: NONREACTIVE

## 2013-08-20 ENCOUNTER — Inpatient Hospital Stay (HOSPITAL_COMMUNITY)
Admission: AD | Admit: 2013-08-20 | Discharge: 2013-08-20 | Disposition: A | Discharge status: Home / Self Care | Payer: Managed Care, Other (non HMO) | Source: Ambulatory Visit | Attending: Obstetrics and Gynecology | Admitting: Obstetrics and Gynecology

## 2013-08-20 ENCOUNTER — Encounter (HOSPITAL_COMMUNITY): Payer: Self-pay

## 2013-08-20 DIAGNOSIS — O99891 Other specified diseases and conditions complicating pregnancy: Secondary | ICD-10-CM | POA: Insufficient documentation

## 2013-08-20 DIAGNOSIS — O368190 Decreased fetal movements, unspecified trimester, not applicable or unspecified: Secondary | ICD-10-CM | POA: Insufficient documentation

## 2013-08-20 DIAGNOSIS — N949 Unspecified condition associated with female genital organs and menstrual cycle: Secondary | ICD-10-CM | POA: Insufficient documentation

## 2013-08-20 DIAGNOSIS — M79609 Pain in unspecified limb: Secondary | ICD-10-CM | POA: Insufficient documentation

## 2013-08-20 DIAGNOSIS — R109 Unspecified abdominal pain: Secondary | ICD-10-CM | POA: Insufficient documentation

## 2013-08-20 DIAGNOSIS — O9989 Other specified diseases and conditions complicating pregnancy, childbirth and the puerperium: Secondary | ICD-10-CM

## 2013-08-20 HISTORY — DX: Headache: R51

## 2013-08-20 LAB — URINE MICROSCOPIC-ADD ON

## 2013-08-20 LAB — URINALYSIS, ROUTINE W REFLEX MICROSCOPIC
Bilirubin Urine: NEGATIVE
Glucose, UA: NEGATIVE mg/dL
Hgb urine dipstick: NEGATIVE
Ketones, ur: NEGATIVE mg/dL
Nitrite: NEGATIVE
PH: 6 (ref 5.0–8.0)
Protein, ur: NEGATIVE mg/dL
SPECIFIC GRAVITY, URINE: 1.01 (ref 1.005–1.030)
UROBILINOGEN UA: 0.2 mg/dL (ref 0.0–1.0)

## 2013-08-20 NOTE — MAU Note (Signed)
Pt reports vaginal pain x 3 days, today noticed vaginal swelling. Also reports decreased fetal movement.

## 2013-08-20 NOTE — MAU Provider Note (Signed)
Chief Complaint:  Groin Swelling and Decreased Fetal Movement   First Provider Initiated Contact with Patient 08/20/13 2025      HPI: Tara Bender is a 28 y.o. G3P1011 at [redacted]w[redacted]d who presents to maternity admissions reporting decreased from normal amount of fetal movement today. She also describes feeling like her vagina and groin are swelling. She has a pulling sensation which is worse with changing position and walking. She has a reports right leg pain starting in popliteal area radiating to the right thigh and sometimes hip. She has a sensation of the leg is mild. This has been been on for about 2 months and she has discussed it with her prenatal care provider. Denies constipation. Denies dysuria, hematuria, urgency or frequency of urination. Denies contractions, leakage of fluid or vaginal bleeding. Good fetal movement.   Pregnancy Course: Essentially uncomplicated.   Past Medical History: Past Medical History  Diagnosis Date  . Obesity   . Bronchitis   . Hand laceration     lt  . SVD (spontaneous vaginal delivery)     x 1  . Bronchitis     uses inhaler prn - rarely uses  . Meningitis   . SAYTKZSW(109.3)     Past obstetric history: OB History  Gravida Para Term Preterm AB SAB TAB Ectopic Multiple Living  3 1 1  1 1    1     # Outcome Date GA Lbr Len/2nd Weight Sex Delivery Anes PTL Lv  3 CUR           2 SAB 07/2012             Comments: System Generated. Please review and update pregnancy details.  1 TRM 08/25/09    M SVD EPI  Y      Past Surgical History: Past Surgical History  Procedure Laterality Date  . Wound exploration  03/14/2012    Procedure: WOUND EXPLORATION;  Surgeon: Tennis Must, MD;  Location: [place];  Service: Orthopedics;  Laterality: Left;  Left Hand Repair Flexor Tendon, Possible Nerve/Artery Repair , debridement of tendon  . Left hand surgery      tendon repair  . Dilation and evacuation N/A 08/16/2012    Procedure: DILATATION  AND EVACUATION;  Surgeon: Delice Lesch, MD;  Location: [facility];  Service: Gynecology;  Laterality: N/A;     Family History: Family History  Problem Relation Age of Onset  . Hearing loss Mother   . Asthma Father     Social History: History  Substance Use Topics  . Smoking status: Never Smoker   . Smokeless tobacco: Never Used  . Alcohol Use: Yes     Comment: socially- DRINKS WINE- LAST TIME - NOV    Allergies: No Known Allergies  Meds:  Prescriptions prior to admission  Medication Sig Dispense Refill  . butalbital-acetaminophen-caffeine (FIORICET, ESGIC) 50-325-40 MG per tablet Take 1 tablet by mouth as needed for headache.      . ondansetron (ZOFRAN) 4 MG tablet Take 4 mg by mouth every 8 (eight) hours as needed for nausea or vomiting.      . Pediatric Multivit-Minerals-C (FLINTSTONES GUMMIES PLUS PO) Take 2 each by mouth daily.        ROS: Pertinent findings in history of present illness.  Physical Exam  Blood pressure 120/62, pulse 82, temperature 99.2 F (37.3 C), temperature source Oral, resp. rate 18, height 5\' 3"  (1.6 m), weight 100.245 kg (221 lb), last menstrual period 03/23/2013, SpO2 100.00%.  GENERAL: Obese female in no acute distress.  HEENT: normocephalic HEART: normal rate RESP: normal effort ABDOMEN: Soft, non-tender, gravid appropriate for gestational age; DT 28 EXTREMITIES: Nontender, no edema. Right leg no erythema or edema noted. Small superficial varicosities. NEURO: alert and oriented PELVIC EXAM: NEFG without swelling or lesions; physiologic discharge, no blood, cervix clean. L/C/H       Labs: Results for orders placed during the hospital encounter of 08/20/13 (from the past 24 hour(s))  URINALYSIS, ROUTINE W REFLEX MICROSCOPIC     Status: Abnormal   Collection Time    08/20/13  7:55 PM      Result Value Ref Range   Color, Urine YELLOW  YELLOW   APPearance CLEAR  CLEAR   Specific Gravity, Urine 1.010  1.005 - 1.030   pH 6.0  5.0 - 8.0    Glucose, UA NEGATIVE  NEGATIVE mg/dL   Hgb urine dipstick NEGATIVE  NEGATIVE   Bilirubin Urine NEGATIVE  NEGATIVE   Ketones, ur NEGATIVE  NEGATIVE mg/dL   Protein, ur NEGATIVE  NEGATIVE mg/dL   Urobilinogen, UA 0.2  0.0 - 1.0 mg/dL   Nitrite NEGATIVE  NEGATIVE   Leukocytes, UA SMALL (*) NEGATIVE  URINE MICROSCOPIC-ADD ON     Status: Abnormal   Collection Time    08/20/13  7:55 PM      Result Value Ref Range   Squamous Epithelial / LPF FEW (*) RARE   WBC, UA 3-6  <3 WBC/hpf   Bacteria, UA RARE  RARE   Urine-Other MUCOUS PRESENT      Imaging:  No results found. MAU Course:   Assessment: 1. Round ligament pain     Plan: Discharge home with reassurance Discussed RLP relief measures    Medication List         butalbital-acetaminophen-caffeine 50-325-40 MG per tablet  Commonly known as:  FIORICET, ESGIC  Take 1 tablet by mouth as needed for headache.     FLINTSTONES GUMMIES PLUS PO  Take 2 each by mouth daily.     ondansetron 4 MG tablet  Commonly known as:  ZOFRAN  Take 4 mg by mouth every 8 (eight) hours as needed for nausea or vomiting.         Lorene Dy, CNM 08/20/2013 8:25 PM

## 2013-09-17 ENCOUNTER — Inpatient Hospital Stay (HOSPITAL_COMMUNITY)
Admission: AD | Admit: 2013-09-17 | Discharge: 2013-09-17 | Disposition: A | Discharge status: Home / Self Care | Payer: Managed Care, Other (non HMO) | Source: Ambulatory Visit | Attending: Obstetrics and Gynecology | Admitting: Obstetrics and Gynecology

## 2013-09-17 ENCOUNTER — Encounter (HOSPITAL_COMMUNITY): Payer: Self-pay | Admitting: *Deleted

## 2013-09-17 DIAGNOSIS — Z36 Encounter for antenatal screening of mother: Secondary | ICD-10-CM

## 2013-09-17 DIAGNOSIS — O368190 Decreased fetal movements, unspecified trimester, not applicable or unspecified: Secondary | ICD-10-CM | POA: Insufficient documentation

## 2013-09-17 DIAGNOSIS — Z3689 Encounter for other specified antenatal screening: Secondary | ICD-10-CM

## 2013-09-17 LAB — URINALYSIS, ROUTINE W REFLEX MICROSCOPIC
Bilirubin Urine: NEGATIVE
Glucose, UA: NEGATIVE mg/dL
Hgb urine dipstick: NEGATIVE
Ketones, ur: NEGATIVE mg/dL
Leukocytes, UA: NEGATIVE
NITRITE: NEGATIVE
PH: 6 (ref 5.0–8.0)
Protein, ur: NEGATIVE mg/dL
SPECIFIC GRAVITY, URINE: 1.02 (ref 1.005–1.030)
Urobilinogen, UA: 1 mg/dL (ref 0.0–1.0)

## 2013-09-17 NOTE — Discharge Instructions (Signed)
Fetal Movement Counts Patient Name: __________________________________________________ Patient Due Date: ____________________ Performing a fetal movement count is highly recommended in high-risk pregnancies, but it is good for every pregnant woman to do. Your caregiver may ask you to start counting fetal movements at 28 weeks of the pregnancy. Fetal movements often increase:  After eating a full meal.  After physical activity.  After eating or drinking something sweet or cold.  At rest. Pay attention to when you feel the baby is most active. This will help you notice a pattern of your baby's sleep and wake cycles and what factors contribute to an increase in fetal movement. It is important to perform a fetal movement count at the same time each day when your baby is normally most active.  HOW TO COUNT FETAL MOVEMENTS 1. Find a quiet and comfortable area to sit or lie down on your left side. Lying on your left side provides the best blood and oxygen circulation to your baby. 2. Write down the day and time on a sheet of paper or in a journal. 3. Start counting kicks, flutters, swishes, rolls, or jabs in a 2 hour period. You should feel at least 10 movements within 2 hours. 4. If you do not feel 10 movements in 2 hours, wait 2 3 hours and count again. Look for a change in the pattern or not enough counts in 2 hours. SEEK MEDICAL CARE IF:  You feel less than 10 counts in 2 hours, tried twice.  There is no movement in over an hour.  The pattern is changing or taking longer each day to reach 10 counts in 2 hours.  You feel the baby is not moving as he or she usually does. Date: ____________ Movements: ____________ Start time: ____________ Finish time: ____________  Date: ____________ Movements: ____________ Start time: ____________ Finish time: ____________ Date: ____________ Movements: ____________ Start time: ____________ Finish time: ____________ Date: ____________ Movements: ____________  Start time: ____________ Finish time: ____________ Date: ____________ Movements: ____________ Start time: ____________ Finish time: ____________ Date: ____________ Movements: ____________ Start time: ____________ Finish time: ____________ Date: ____________ Movements: ____________ Start time: ____________ Finish time: ____________ Date: ____________ Movements: ____________ Start time: ____________ Finish time: ____________  Date: ____________ Movements: ____________ Start time: ____________ Finish time: ____________ Date: ____________ Movements: ____________ Start time: ____________ Finish time: ____________ Date: ____________ Movements: ____________ Start time: ____________ Finish time: ____________ Date: ____________ Movements: ____________ Start time: ____________ Finish time: ____________ Date: ____________ Movements: ____________ Start time: ____________ Finish time: ____________ Date: ____________ Movements: ____________ Start time: ____________ Finish time: ____________ Date: ____________ Movements: ____________ Start time: ____________ Finish time: ____________  Date: ____________ Movements: ____________ Start time: ____________ Finish time: ____________ Date: ____________ Movements: ____________ Start time: ____________ Finish time: ____________ Date: ____________ Movements: ____________ Start time: ____________ Finish time: ____________ Date: ____________ Movements: ____________ Start time: ____________ Finish time: ____________ Date: ____________ Movements: ____________ Start time: ____________ Finish time: ____________ Date: ____________ Movements: ____________ Start time: ____________ Finish time: ____________ Date: ____________ Movements: ____________ Start time: ____________ Finish time: ____________  Date: ____________ Movements: ____________ Start time: ____________ Finish time: ____________ Date: ____________ Movements: ____________ Start time: ____________ Finish time:  ____________ Date: ____________ Movements: ____________ Start time: ____________ Finish time: ____________ Date: ____________ Movements: ____________ Start time: ____________ Finish time: ____________ Date: ____________ Movements: ____________ Start time: ____________ Finish time: ____________ Date: ____________ Movements: ____________ Start time: ____________ Finish time: ____________ Date: ____________ Movements: ____________ Start time: ____________ Finish time: ____________  Date: ____________ Movements: ____________ Start time: ____________ Finish   time: ____________ Date: ____________ Movements: ____________ Start time: ____________ Finish time: ____________ Date: ____________ Movements: ____________ Start time: ____________ Finish time: ____________ Date: ____________ Movements: ____________ Start time: ____________ Finish time: ____________ Date: ____________ Movements: ____________ Start time: ____________ Finish time: ____________ Date: ____________ Movements: ____________ Start time: ____________ Finish time: ____________ Date: ____________ Movements: ____________ Start time: ____________ Finish time: ____________  Date: ____________ Movements: ____________ Start time: ____________ Finish time: ____________ Date: ____________ Movements: ____________ Start time: ____________ Finish time: ____________ Date: ____________ Movements: ____________ Start time: ____________ Finish time: ____________ Date: ____________ Movements: ____________ Start time: ____________ Finish time: ____________ Date: ____________ Movements: ____________ Start time: ____________ Finish time: ____________ Date: ____________ Movements: ____________ Start time: ____________ Finish time: ____________ Date: ____________ Movements: ____________ Start time: ____________ Finish time: ____________  Date: ____________ Movements: ____________ Start time: ____________ Finish time: ____________ Date: ____________ Movements:  ____________ Start time: ____________ Finish time: ____________ Date: ____________ Movements: ____________ Start time: ____________ Finish time: ____________ Date: ____________ Movements: ____________ Start time: ____________ Finish time: ____________ Date: ____________ Movements: ____________ Start time: ____________ Finish time: ____________ Date: ____________ Movements: ____________ Start time: ____________ Finish time: ____________ Date: ____________ Movements: ____________ Start time: ____________ Finish time: ____________  Date: ____________ Movements: ____________ Start time: ____________ Finish time: ____________ Date: ____________ Movements: ____________ Start time: ____________ Finish time: ____________ Date: ____________ Movements: ____________ Start time: ____________ Finish time: ____________ Date: ____________ Movements: ____________ Start time: ____________ Finish time: ____________ Date: ____________ Movements: ____________ Start time: ____________ Finish time: ____________ Date: ____________ Movements: ____________ Start time: ____________ Finish time: ____________ Document Released: 05/10/2006 Document Revised: 03/27/2012 Document Reviewed: 02/05/2012 [employer] Patient Information 2014 [employer].  

## 2013-09-17 NOTE — MAU Provider Note (Signed)
History     CSN: [id number]  Arrival date and time: 09/17/13 3151   First Provider Initiated Contact with Patient 09/17/13 2008      Chief Complaint  Patient presents with  . Decreased Fetal Movement   HPI [name] is a 28 y.o. G3P1011 at [redacted]w[redacted]d who presents to MAU today with complaint of decreased fetal movement since yesterday. She denies contractions, vaginal bleeding, LOF, or diarrhea. She states nausea x 2-3 days with one episode of vomiting. She denies complications with this pregnancy.  OB History   Grav Para Term Preterm Abortions TAB SAB Ect Mult Living   3 1 1  1  1   1       Past Medical History  Diagnosis Date  . Obesity   . Bronchitis   . Hand laceration     lt  . SVD (spontaneous vaginal delivery)     x 1  . Bronchitis     uses inhaler prn - rarely uses  . Meningitis   . VOHYWVPX(106.2)     Past Surgical History  Procedure Laterality Date  . Wound exploration  03/14/2012    Procedure: WOUND EXPLORATION;  Surgeon: Tennis Must, MD;  Location: [place];  Service: Orthopedics;  Laterality: Left;  Left Hand Repair Flexor Tendon, Possible Nerve/Artery Repair , debridement of tendon  . Left hand surgery      tendon repair  . Dilation and evacuation N/A 08/16/2012    Procedure: DILATATION AND EVACUATION;  Surgeon: Delice Lesch, MD;  Location: [facility];  Service: Gynecology;  Laterality: N/A;    Family History  Problem Relation Age of Onset  . Hearing loss Mother   . Asthma Father     History  Substance Use Topics  . Smoking status: Never Smoker   . Smokeless tobacco: Never Used  . Alcohol Use: Yes     Comment: socially- DRINKS WINE- LAST TIME - NOV    Allergies: No Known Allergies  Prescriptions prior to admission  Medication Sig Dispense Refill  . butalbital-acetaminophen-caffeine (FIORICET, ESGIC) 50-325-40 MG per tablet Take 1 tablet by mouth as needed for headache.      . nitrofurantoin,  macrocrystal-monohydrate, (MACROBID) 100 MG capsule Take 100 mg by mouth 2 (two) times daily.      . ondansetron (ZOFRAN) 4 MG tablet Take 4 mg by mouth every 8 (eight) hours as needed for nausea or vomiting.      . Pediatric Multivit-Minerals-C (FLINTSTONES GUMMIES PLUS PO) Take 2 each by mouth daily.        Review of Systems  Constitutional: Negative for fever and malaise/fatigue.  Gastrointestinal: Positive for nausea and vomiting. Negative for abdominal pain, diarrhea and constipation.  Genitourinary: Negative for dysuria, urgency and frequency.       Neg - vaginal bleeding,discharge, LOF   Physical Exam   Blood pressure 119/60, pulse 85, temperature 99 F (37.2 C), last menstrual period 03/23/2013, SpO2 100.00%.  Physical Exam  Constitutional: She is oriented to person, place, and time. She appears well-developed and well-nourished. No distress.  HENT:  Head: Normocephalic and atraumatic.  Cardiovascular: Normal rate, regular rhythm and normal heart sounds.   Respiratory: Effort normal and breath sounds normal. No respiratory distress.  GI: Soft. She exhibits no distension and no mass. There is no tenderness. There is no rebound and no guarding.  Neurological: She is alert and oriented to person, place, and time.  Skin: Skin is warm and dry. No erythema.  Psychiatric: She has a normal mood and affect.    Results for orders placed during the hospital encounter of 09/17/13 (from the past 24 hour(s))  URINALYSIS, ROUTINE W REFLEX MICROSCOPIC     Status: None   Collection Time    09/17/13  8:21 PM      Result Value Ref Range   Color, Urine YELLOW  YELLOW   APPearance CLEAR  CLEAR   Specific Gravity, Urine 1.020  1.005 - 1.030   pH 6.0  5.0 - 8.0   Glucose, UA NEGATIVE  NEGATIVE mg/dL   Hgb urine dipstick NEGATIVE  NEGATIVE   Bilirubin Urine NEGATIVE  NEGATIVE   Ketones, ur NEGATIVE  NEGATIVE mg/dL   Protein, ur NEGATIVE  NEGATIVE mg/dL   Urobilinogen, UA 1.0  0.0 - 1.0  mg/dL   Nitrite NEGATIVE  NEGATIVE   Leukocytes, UA NEGATIVE  NEGATIVE     Fetal Monitoring: Baseline: 135 bpm, moderate variability, + accelerations, no decelerations Contractions: None  MAU Course  Procedures None  MDM Reassuring NST for gestational age.  Discussed patient with Dr. Ouida Sills. [place] for discharge with reassurance. Follow-up as scheduled  Assessment and Plan  A: SIUP at [redacted]w[redacted]d Reactive NST  P: Discharge home Kick counts discussed Patient advised to keep appointment with [facility] as scheduled Patient may return to MAU as needed or if her condition were to change or worsen  Farris Has, PA-C  09/17/2013, 9:22 PM

## 2013-09-17 NOTE — MAU Note (Signed)
Last felt baby move yesterday morning.  Denies LOF/VB/Contractions.

## 2013-11-02 ENCOUNTER — Inpatient Hospital Stay (HOSPITAL_COMMUNITY)
Admission: AD | Admit: 2013-11-02 | Discharge: 2013-11-02 | Disposition: A | Discharge status: Home / Self Care | Payer: Managed Care, Other (non HMO) | Source: Ambulatory Visit | Attending: Obstetrics & Gynecology | Admitting: Obstetrics & Gynecology

## 2013-11-02 ENCOUNTER — Encounter (HOSPITAL_COMMUNITY): Payer: Self-pay | Admitting: Radiology

## 2013-11-02 ENCOUNTER — Inpatient Hospital Stay (HOSPITAL_COMMUNITY): Payer: Managed Care, Other (non HMO)

## 2013-11-02 DIAGNOSIS — R0602 Shortness of breath: Secondary | ICD-10-CM | POA: Insufficient documentation

## 2013-11-02 DIAGNOSIS — J9819 Other pulmonary collapse: Secondary | ICD-10-CM | POA: Insufficient documentation

## 2013-11-02 DIAGNOSIS — Z3493 Encounter for supervision of normal pregnancy, unspecified, third trimester: Secondary | ICD-10-CM

## 2013-11-02 DIAGNOSIS — O9989 Other specified diseases and conditions complicating pregnancy, childbirth and the puerperium: Principal | ICD-10-CM

## 2013-11-02 DIAGNOSIS — O99891 Other specified diseases and conditions complicating pregnancy: Secondary | ICD-10-CM | POA: Diagnosis not present

## 2013-11-02 LAB — COMPREHENSIVE METABOLIC PANEL
ALT: 11 U/L (ref 0–35)
AST: 14 U/L (ref 0–37)
Albumin: 2.8 g/dL — ABNORMAL LOW (ref 3.5–5.2)
Alkaline Phosphatase: 117 U/L (ref 39–117)
Anion gap: 14 (ref 5–15)
BUN: 6 mg/dL (ref 6–23)
CO2: 21 mEq/L (ref 19–32)
Calcium: 8.9 mg/dL (ref 8.4–10.5)
Chloride: 100 mEq/L (ref 96–112)
Creatinine, Ser: 0.53 mg/dL (ref 0.50–1.10)
GFR calc Af Amer: >90 mL/min (ref >90)
GFR calc non Af Amer: >90 mL/min (ref >90)
Glucose, Bld: 70 mg/dL (ref 70–99)
Potassium: 3.9 mEq/L (ref 3.7–5.3)
Sodium: 135 mEq/L — ABNORMAL LOW (ref 137–147)
Total Bilirubin: 0.3 mg/dL (ref 0.3–1.2)
Total Protein: 6.8 g/dL (ref 6.0–8.3)

## 2013-11-02 LAB — CBC
HCT: 31.2 % — ABNORMAL LOW (ref 36.0–46.0)
Hemoglobin: 9.9 g/dL — ABNORMAL LOW (ref 12.0–15.0)
MCH: 24.1 pg — ABNORMAL LOW (ref 26.0–34.0)
MCHC: 31.7 g/dL (ref 30.0–36.0)
MCV: 76.1 fL — ABNORMAL LOW (ref 78.0–100.0)
Platelets: 242 10*3/uL (ref 150–400)
RBC: 4.1 MIL/uL (ref 3.87–5.11)
RDW: 14.5 % (ref 11.5–15.5)
WBC: 11.7 10*3/uL — ABNORMAL HIGH (ref 4.0–10.5)

## 2013-11-02 MED ORDER — IOHEXOL 350 MG/ML SOLN
100.0000 mL | Freq: Once | INTRAVENOUS | Status: AC | PRN
  Administered 2013-11-02: 100 mL via INTRAVENOUS

## 2013-11-02 MED ORDER — ALBUTEROL SULFATE HFA 108 (90 BASE) MCG/ACT IN AERS: 1.0000 | INHALATION_SPRAY | Freq: Four times a day (QID) | RESPIRATORY_TRACT | Status: DC | PRN

## 2013-11-02 NOTE — MAU Provider Note (Signed)
Reviewed case with NP and I agree with above  Tara Bender STACIA

## 2013-11-02 NOTE — Discharge Instructions (Signed)

## 2013-11-02 NOTE — MAU Note (Signed)
Pain in upper back, woke up hurting.  Hurts to breath, esp to take a deep breath, making her feel short of breath. On the way over here, felt tightening in abd, not painful

## 2013-11-02 NOTE — MAU Provider Note (Signed)
History     CSN: [id number]  Arrival date and time: 11/02/13 1435   First Provider Initiated Contact with Patient 11/02/13 1601      Chief Complaint  Patient presents with  . Back Pain  . Shortness of Breath   HPI Tara Bender is a 28 y.o. G3P1011 at [redacted]w[redacted]d. She woke up this morning at 7:00 with difficulty taking deep breaths. She has back pain-mid scapular, worse on the L-constant, worse with inhalation. No contractions, leaking or bleeding. Good fetal activity.   OB History   Grav Para Term Preterm Abortions TAB SAB Ect Mult Living   3 1 1  1  1   1       Past Medical History  Diagnosis Date  . Obesity   . Bronchitis   . Hand laceration     lt  . SVD (spontaneous vaginal delivery)     x 1  . Bronchitis     uses inhaler prn - rarely uses  . Meningitis   . BSWHQPRF(163.8)     Past Surgical History  Procedure Laterality Date  . Wound exploration  03/14/2012    Procedure: WOUND EXPLORATION;  Surgeon: Tennis Must, MD;  Location: [place];  Service: Orthopedics;  Laterality: Left;  Left Hand Repair Flexor Tendon, Possible Nerve/Artery Repair , debridement of tendon  . Left hand surgery      tendon repair  . Dilation and evacuation N/A 08/16/2012    Procedure: DILATATION AND EVACUATION;  Surgeon: Delice Lesch, MD;  Location: [facility];  Service: Gynecology;  Laterality: N/A;    Family History  Problem Relation Age of Onset  . Hearing loss Mother   . Asthma Father     History  Substance Use Topics  . Smoking status: Never Smoker   . Smokeless tobacco: Never Used  . Alcohol Use: Yes     Comment: socially- DRINKS WINE- LAST TIME - NOV    Allergies: No Known Allergies  Prescriptions prior to admission  Medication Sig Dispense Refill  . butalbital-acetaminophen-caffeine (FIORICET, ESGIC) 50-325-40 MG per tablet Take 1 tablet by mouth as needed for headache.      . ondansetron (ZOFRAN) 4 MG tablet Take 4 mg by mouth every 8 (eight) hours  as needed for nausea or vomiting.      . Pediatric Multivit-Minerals-C (FLINTSTONES GUMMIES PLUS PO) Take 2 each by mouth daily.        Review of Systems  Constitutional: Negative for fever and chills.  Respiratory: Positive for shortness of breath. Negative for cough.   Cardiovascular: Negative for chest pain and palpitations.  Gastrointestinal: Negative for heartburn, nausea, vomiting, abdominal pain, diarrhea and constipation.  Genitourinary: Negative for dysuria, urgency and frequency.  Musculoskeletal: Positive for back pain.  Neurological: Negative for dizziness, focal weakness and headaches.   Physical Exam   Blood pressure 115/73, pulse 85, temperature 98.9 F (37.2 C), temperature source Oral, resp. rate 28, last menstrual period 03/23/2013, SpO2 100.00%.  Physical Exam  Nursing note and vitals reviewed. Constitutional: She is oriented to person, place, and time. She appears well-developed and well-nourished.  Cardiovascular: Normal rate and regular rhythm.   Murmur heard. Respiratory: Effort normal and breath sounds normal. No respiratory distress.  Neurological: She is alert and oriented to person, place, and time.  Skin: Skin is warm and dry.  Psychiatric: She has a normal mood and affect. Her behavior is normal.    MAU Course  Procedures  MDM 4:30 consulted with  Dr Alwyn Pea- will get chest CT Results for orders placed during the hospital encounter of 11/02/13 (from the past 24 hour(s))  CBC     Status: Abnormal   Collection Time    11/02/13  6:25 PM      Result Value Ref Range   WBC 11.7 (*) 4.0 - 10.5 K/uL   RBC 4.10  3.87 - 5.11 MIL/uL   Hemoglobin 9.9 (*) 12.0 - 15.0 g/dL   HCT 31.2 (*) 36.0 - 46.0 %   MCV 76.1 (*) 78.0 - 100.0 fL   MCH 24.1 (*) 26.0 - 34.0 pg   MCHC 31.7  30.0 - 36.0 g/dL   RDW 14.5  11.5 - 15.5 %   Platelets 242  150 - 400 K/uL  COMPREHENSIVE METABOLIC PANEL     Status: Abnormal   Collection Time    11/02/13  6:25 PM      Result Value  Ref Range   Sodium 135 (*) 137 - 147 mEq/L   Potassium 3.9  3.7 - 5.3 mEq/L   Chloride 100  96 - 112 mEq/L   CO2 21  19 - 32 mEq/L   Glucose, Bld 70  70 - 99 mg/dL   BUN 6  6 - 23 mg/dL   Creatinine, Ser 0.53  0.50 - 1.10 mg/dL   Calcium 8.9  8.4 - 10.5 mg/dL   Total Protein 6.8  6.0 - 8.3 g/dL   Albumin 2.8 (*) 3.5 - 5.2 g/dL   AST 14  0 - 37 U/L   ALT 11  0 - 35 U/L   Alkaline Phosphatase 117  39 - 117 U/L   Total Bilirubin 0.3  0.3 - 1.2 mg/dL   GFR calc non Af Amer >90  >90 mL/min   GFR calc Af Amer >90  >90 mL/min   Anion gap 14  5 - 15    Ct Angio Chest W/cm &/or Wo Cm  11/02/2013   CLINICAL DATA:  Upper back pain, hurts to breathe particularly taking a deep breath, shortness of breath question pulmonary embolism, [redacted] weeks pregnant  EXAM: CT ANGIOGRAPHY CHEST WITH CONTRAST  TECHNIQUE: Multidetector CT imaging of the chest was performed using the standard protocol during bolus administration of intravenous contrast. Multiplanar CT image reconstructions and MIPs were obtained to evaluate the vascular anatomy. Due to failure of the IV line, patient was imaged twice.  CONTRAST:  193mL OMNIPAQUE IOHEXOL 350 MG/ML SOLN IV  COMPARISON:  None  FINDINGS: Aorta normal caliber without aneurysm or dissection.  No thoracic adenopathy.  Pulmonary arteries patent.  No evidence of pulmonary embolism.  Hypervascular focus lateral aspect RIGHT lobe liver 2.8 x 2.0 cm image 87.  Question additional hypervascular focus in LEFT lobe on last image, 1.4 x 1.2 cm, incompletely imaged, size incompletely assessed.  Remaining visualized upper abdomen normal appearance.  Dependent atelectasis LEFT lower lobe.  Lungs otherwise clear.  No infiltrate, pleural effusion or pneumothorax.  No acute osseous findings.  Review of the MIP images confirms the above findings.  IMPRESSION: No evidence of pulmonary embolism.  Minimal atelectasis dependently LEFT lower lobe.  Hypervascular lesion laterally in RIGHT lobe liver 2.8 x  2.0 cm within additional hypervascular focus in LEFT lobe on last image measuring at least 1.4 x 1.2 cm, incompletely assessed; these could potentially represent atypical hemangiomata though other etiologies including focal nodular hyperplasia and adenoma are not excluded.  Recommend characterization of these lesions by MR imaging abdomen with and without contrast AFTER patient  is postpartum.   Electronically Signed   By: Lavonia Dana M.D.   On: 11/02/2013 18:15     Assessment and Plan  32 wks No evidence PE Minimal atelectasis depend LLL ? Hemangiomata in liver, needs f/u pp   Consulted with Dr Alwyn Pea Tylenol Incentive spir for home use Albutrol inhaler prn use Call the office if S&S continue F/U in office as scheduled  Mida Cory M. 11/02/2013, 4:09 PM

## 2013-11-19 ENCOUNTER — Inpatient Hospital Stay (HOSPITAL_COMMUNITY)
Admission: AD | Admit: 2013-11-19 | Discharge: 2013-11-19 | Disposition: A | Discharge status: Home / Self Care | Payer: Managed Care, Other (non HMO) | Source: Ambulatory Visit | Attending: Obstetrics & Gynecology | Admitting: Obstetrics & Gynecology

## 2013-11-19 ENCOUNTER — Encounter (HOSPITAL_COMMUNITY): Payer: Self-pay | Admitting: *Deleted

## 2013-11-19 DIAGNOSIS — O99891 Other specified diseases and conditions complicating pregnancy: Secondary | ICD-10-CM | POA: Diagnosis not present

## 2013-11-19 DIAGNOSIS — R109 Unspecified abdominal pain: Secondary | ICD-10-CM | POA: Diagnosis present

## 2013-11-19 DIAGNOSIS — O26899 Other specified pregnancy related conditions, unspecified trimester: Secondary | ICD-10-CM

## 2013-11-19 DIAGNOSIS — O9989 Other specified diseases and conditions complicating pregnancy, childbirth and the puerperium: Secondary | ICD-10-CM

## 2013-11-19 LAB — URINALYSIS, ROUTINE W REFLEX MICROSCOPIC
Bilirubin Urine: NEGATIVE
Glucose, UA: NEGATIVE mg/dL
Hgb urine dipstick: NEGATIVE
KETONES UR: NEGATIVE mg/dL
Leukocytes, UA: NEGATIVE
Nitrite: NEGATIVE
Protein, ur: NEGATIVE mg/dL
SPECIFIC GRAVITY, URINE: 1.01 (ref 1.005–1.030)
Urobilinogen, UA: 1 mg/dL (ref 0.0–1.0)
pH: 7 (ref 5.0–8.0)

## 2013-11-19 NOTE — MAU Note (Signed)
Having a lot of pressure and real bad cramping. Started this morning. No hx of PTL or PTD.

## 2013-11-19 NOTE — Discharge Instructions (Signed)
Abdominal Pain During Pregnancy °Belly (abdominal) pain is common during pregnancy. Most of the time, it is not a serious problem. Other times, it can be a sign that something is wrong with the pregnancy. Always tell your doctor if you have belly pain. °HOME CARE °Monitor your belly pain for any changes. The following actions may help you feel better: °· Do not have sex (intercourse) or put anything in your vagina until you feel better. °· Rest until your pain stops. °· Drink clear fluids if you feel sick to your stomach (nauseous). Do not eat solid food until you feel better. °· Only take medicine as told by your doctor. °· Keep all doctor visits as told. °GET HELP RIGHT AWAY IF:  °· You are bleeding, leaking fluid, or pieces of tissue come out of your vagina. °· You have more pain or cramping. °· You keep throwing up (vomiting). °· You have pain when you pee (urinate) or have blood in your pee. °· You have a fever. °· You do not feel your baby moving as much. °· You feel very weak or feel like passing out. °· You have trouble breathing, with or without belly pain. °· You have a very bad headache and belly pain. °· You have fluid leaking from your vagina and belly pain. °· You keep having watery poop (diarrhea). °· Your belly pain does not go away after resting, or the pain gets worse. °MAKE SURE YOU:  °· Understand these instructions. °· Will watch your condition. °· Will get help right away if you are not doing well or get worse. °Document Released: 03/29/2009 Document Revised: 12/11/2012 Document Reviewed: 11/07/2012 °ExitCare® Patient Information ©2015 [employer]. This information is not intended to replace advice given to you by your health care provider. Make sure you discuss any questions you have with your health care provider. ° °

## 2013-11-19 NOTE — MAU Provider Note (Signed)
Reviewed case with PA and I agree with above  Tara Bender STACIA

## 2013-11-19 NOTE — MAU Note (Signed)
Variable in FHR tracing; patient was lying for SVE. Recovered to baseline.

## 2013-11-19 NOTE — MAU Provider Note (Signed)
History     CSN: [id number]  Arrival date and time: 11/19/13 1043   First Provider Initiated Contact with Patient 11/19/13 1115      Chief Complaint  Patient presents with  . Abdominal Cramping   HPI Tara Bender is a 28 y.o. G3P1011 at [redacted]w[redacted]d who presents to MAU today with complaint of cramping and pressure in the lower abdomen since this morning. She also has occasional nausea without vomiting, diarrhea or constipation. She denies vaginal bleeding, discharge, LOF, contractions or UTI symptoms. She denies any complications with this pregnancy or history of PTL or PTD. She reports good fetal movement.   OB History   Grav Para Term Preterm Abortions TAB SAB Ect Mult Living   3 1 1  1  1   1       Past Medical History  Diagnosis Date  . Obesity   . Bronchitis   . Hand laceration     lt  . SVD (spontaneous vaginal delivery)     x 1  . Bronchitis     uses inhaler prn - rarely uses  . Meningitis   . BHALPFXT(024.0)     Past Surgical History  Procedure Laterality Date  . Wound exploration  03/14/2012    Procedure: WOUND EXPLORATION;  Surgeon: Tennis Must, MD;  Location: [place];  Service: Orthopedics;  Laterality: Left;  Left Hand Repair Flexor Tendon, Possible Nerve/Artery Repair , debridement of tendon  . Left hand surgery      tendon repair  . Dilation and evacuation N/A 08/16/2012    Procedure: DILATATION AND EVACUATION;  Surgeon: Delice Lesch, MD;  Location: [facility];  Service: Gynecology;  Laterality: N/A;    Family History  Problem Relation Age of Onset  . Hearing loss Mother   . Asthma Father     History  Substance Use Topics  . Smoking status: Never Smoker   . Smokeless tobacco: Never Used  . Alcohol Use: Yes     Comment: socially- DRINKS WINE- LAST TIME - NOV    Allergies: No Known Allergies  No prescriptions prior to admission    Review of Systems  Constitutional: Negative for fever and malaise/fatigue.   Gastrointestinal: Positive for nausea and abdominal pain. Negative for vomiting, diarrhea and constipation.  Genitourinary: Negative for dysuria, urgency and frequency.       Neg - vaginal bleeding, discharge, LOF   Physical Exam   Blood pressure 115/59, pulse 78, temperature 98.9 F (37.2 C), temperature source Oral, resp. rate 20, height 5' 2.5" (1.588 m), weight 218 lb (98.884 kg), last menstrual period 03/23/2013.  Physical Exam  Constitutional: She is oriented to person, place, and time. She appears well-developed and well-nourished. No distress.  HENT:  Head: Normocephalic.  Cardiovascular: Normal rate, regular rhythm and normal heart sounds.   Respiratory: Effort normal.  GI: Soft. Bowel sounds are normal. She exhibits no distension and no mass. There is no tenderness. There is no rebound and no guarding.  Neurological: She is alert and oriented to person, place, and time.  Skin: Skin is warm and dry. No erythema.  Psychiatric: She has a normal mood and affect.  Dilation: Fingertip Effacement (%): 40 Cervical Position: Posterior Station: Ballotable Exam by:: Bethena Midget PA  Results for orders placed during the hospital encounter of 11/19/13 (from the past 48 hour(s))  URINALYSIS, ROUTINE W REFLEX MICROSCOPIC     Status: Abnormal   Collection Time    11/19/13 11:00 AM  Result Value Ref Range   Color, Urine YELLOW  YELLOW   APPearance HAZY (*) CLEAR   Specific Gravity, Urine 1.010  1.005 - 1.030   pH 7.0  5.0 - 8.0   Glucose, UA NEGATIVE  NEGATIVE mg/dL   Hgb urine dipstick NEGATIVE  NEGATIVE   Bilirubin Urine NEGATIVE  NEGATIVE   Ketones, ur NEGATIVE  NEGATIVE mg/dL   Protein, ur NEGATIVE  NEGATIVE mg/dL   Urobilinogen, UA 1.0  0.0 - 1.0 mg/dL   Nitrite NEGATIVE  NEGATIVE   Leukocytes, UA NEGATIVE  NEGATIVE   Comment: MICROSCOPIC NOT DONE ON URINES WITH NEGATIVE PROTEIN, BLOOD, LEUKOCYTES, NITRITE, OR GLUCOSE <1000 mg/dL.   Fetal Monitoring: Baseline: 130 bpm,  moderate variability, + accelerations, no decelerations Contractions: none  MAU Course  Procedures None  MDM Discussed with Dr. Alwyn Pea. Tylenol PRN.   Assessment and Plan  A: Abdominal pain in pregnancy, antepartum  P: Discharge home Advised Tylenol PRN for pain Discussed use of abdominal binder  Follow-up in the office as scheduled for routine prenatal care with [facility] Patient may return to MAU as needed or if her condition were to change or worsen   Farris Has, PA-C  11/19/2013, 3:44 PM

## 2013-12-04 LAB — OB RESULTS CONSOLE GBS: STREP GROUP B AG: NEGATIVE

## 2013-12-04 LAB — OB RESULTS CONSOLE GC/CHLAMYDIA
Chlamydia: NEGATIVE
Gonorrhea: NEGATIVE

## 2013-12-11 ENCOUNTER — Inpatient Hospital Stay (HOSPITAL_COMMUNITY)
Admission: AD | Admit: 2013-12-11 | Discharge: 2013-12-11 | Disposition: A | Discharge status: Home / Self Care | Payer: Managed Care, Other (non HMO) | Source: Ambulatory Visit | Attending: Obstetrics | Admitting: Obstetrics

## 2013-12-11 ENCOUNTER — Encounter (HOSPITAL_COMMUNITY): Payer: Self-pay | Admitting: *Deleted

## 2013-12-11 DIAGNOSIS — O479 False labor, unspecified: Secondary | ICD-10-CM | POA: Diagnosis not present

## 2013-12-11 NOTE — MAU Note (Signed)
Pt reports ctx since 1am .  Denies SROM or Bleeding at this time. Reports good fetal movement

## 2013-12-20 ENCOUNTER — Inpatient Hospital Stay (HOSPITAL_COMMUNITY): Payer: Managed Care, Other (non HMO)

## 2013-12-20 ENCOUNTER — Encounter (HOSPITAL_COMMUNITY): Payer: Managed Care, Other (non HMO) | Admitting: Anesthesiology

## 2013-12-20 ENCOUNTER — Encounter (HOSPITAL_COMMUNITY): Payer: Self-pay | Admitting: *Deleted

## 2013-12-20 ENCOUNTER — Inpatient Hospital Stay (HOSPITAL_COMMUNITY)
Admission: AD | Admit: 2013-12-20 | Discharge: 2013-12-22 | DRG: 774 | Disposition: A | Discharge status: Home / Self Care | Payer: Managed Care, Other (non HMO) | Source: Ambulatory Visit | Attending: Obstetrics | Admitting: Obstetrics

## 2013-12-20 ENCOUNTER — Inpatient Hospital Stay (HOSPITAL_COMMUNITY): Payer: Managed Care, Other (non HMO) | Admitting: Anesthesiology

## 2013-12-20 DIAGNOSIS — D649 Anemia, unspecified: Secondary | ICD-10-CM | POA: Diagnosis present

## 2013-12-20 DIAGNOSIS — O368190 Decreased fetal movements, unspecified trimester, not applicable or unspecified: Secondary | ICD-10-CM

## 2013-12-20 DIAGNOSIS — O9902 Anemia complicating childbirth: Secondary | ICD-10-CM | POA: Diagnosis present

## 2013-12-20 DIAGNOSIS — O368131 Decreased fetal movements, third trimester, fetus 1: Secondary | ICD-10-CM

## 2013-12-20 DIAGNOSIS — O98519 Other viral diseases complicating pregnancy, unspecified trimester: Secondary | ICD-10-CM | POA: Diagnosis present

## 2013-12-20 DIAGNOSIS — A6000 Herpesviral infection of urogenital system, unspecified: Secondary | ICD-10-CM | POA: Diagnosis present

## 2013-12-20 HISTORY — DX: Decreased fetal movements, unspecified trimester, not applicable or unspecified: O36.8190

## 2013-12-20 LAB — CBC
HCT: 30.9 % — ABNORMAL LOW (ref 36.0–46.0)
HEMOGLOBIN: 9.5 g/dL — AB (ref 12.0–15.0)
MCH: 22.4 pg — ABNORMAL LOW (ref 26.0–34.0)
MCHC: 30.7 g/dL (ref 30.0–36.0)
MCV: 72.9 fL — ABNORMAL LOW (ref 78.0–100.0)
Platelets: 258 10*3/uL (ref 150–400)
RBC: 4.24 MIL/uL (ref 3.87–5.11)
RDW: 15.4 % (ref 11.5–15.5)
WBC: 7.8 10*3/uL (ref 4.0–10.5)

## 2013-12-20 LAB — RPR

## 2013-12-20 MED ORDER — ONDANSETRON HCL 4 MG/2ML IJ SOLN
4.0000 mg | Freq: Four times a day (QID) | INTRAMUSCULAR | Status: DC | PRN
Start: 2013-12-20 — End: 2013-12-21

## 2013-12-20 MED ORDER — CITRIC ACID-SODIUM CITRATE 334-500 MG/5ML PO SOLN
30.0000 mL | ORAL | Status: DC | PRN
Start: 2013-12-20 — End: 2013-12-21

## 2013-12-20 MED ORDER — LIDOCAINE HCL (PF) 1 % IJ SOLN
INTRAMUSCULAR | Status: DC | PRN
  Administered 2013-12-20 (×2): 5 mL
  Administered 2013-12-20: 3 mL

## 2013-12-20 MED ORDER — ACETAMINOPHEN 325 MG PO TABS
650.0000 mg | ORAL_TABLET | ORAL | Status: DC | PRN
Start: 2013-12-20 — End: 2013-12-21

## 2013-12-20 MED ORDER — FENTANYL 2.5 MCG/ML BUPIVACAINE 1/10 % EPIDURAL INFUSION (WH - ANES)
INTRAMUSCULAR | Status: DC | PRN
  Administered 2013-12-20: 14 mL/h via EPIDURAL

## 2013-12-20 MED ORDER — FLEET ENEMA 7-19 GM/118ML RE ENEM: 1.0000 | ENEMA | RECTAL | Status: DC | PRN

## 2013-12-20 MED ORDER — LIDOCAINE HCL (PF) 1 % IJ SOLN
30.0000 mL | INTRAMUSCULAR | Status: DC | PRN
  Filled 2013-12-20 (×2): qty 30

## 2013-12-20 MED ORDER — FENTANYL 2.5 MCG/ML BUPIVACAINE 1/10 % EPIDURAL INFUSION (WH - ANES)
14.0000 mL/h | INTRAMUSCULAR | Status: DC | PRN
  Administered 2013-12-20: 14 mL/h via EPIDURAL
  Filled 2013-12-20: qty 125

## 2013-12-20 MED ORDER — OXYCODONE-ACETAMINOPHEN 5-325 MG PO TABS: 1.0000 | ORAL_TABLET | ORAL | Status: DC | PRN

## 2013-12-20 MED ORDER — OXYTOCIN BOLUS FROM INFUSION
500.0000 mL | INTRAVENOUS | Status: DC
  Administered 2013-12-20: 500 mL via INTRAVENOUS

## 2013-12-20 MED ORDER — LACTATED RINGERS IV SOLN
500.0000 mL | Freq: Once | INTRAVENOUS | Status: AC
  Administered 2013-12-20: 500 mL via INTRAVENOUS

## 2013-12-20 MED ORDER — IBUPROFEN 600 MG PO TABS
600.0000 mg | ORAL_TABLET | Freq: Four times a day (QID) | ORAL | Status: DC | PRN
  Administered 2013-12-21: 600 mg via ORAL
  Filled 2013-12-20: qty 1

## 2013-12-20 MED ORDER — OXYTOCIN 40 UNITS IN LACTATED RINGERS INFUSION - SIMPLE MED
1.0000 m[IU]/min | INTRAVENOUS | Status: DC
  Administered 2013-12-20: 2 m[IU]/min via INTRAVENOUS
  Filled 2013-12-20: qty 1000

## 2013-12-20 MED ORDER — LACTATED RINGERS IV SOLN
INTRAVENOUS | Status: DC
  Administered 2013-12-20: 500 mL via INTRAVENOUS
  Administered 2013-12-20 (×2): via INTRAVENOUS

## 2013-12-20 MED ORDER — EPHEDRINE 5 MG/ML INJ
10.0000 mg | INTRAVENOUS | Status: DC | PRN
  Filled 2013-12-20: qty 2

## 2013-12-20 MED ORDER — DIPHENHYDRAMINE HCL 50 MG/ML IJ SOLN: 12.5000 mg | INTRAMUSCULAR | Status: DC | PRN

## 2013-12-20 MED ORDER — OXYTOCIN 40 UNITS IN LACTATED RINGERS INFUSION - SIMPLE MED
62.5000 mL/h | INTRAVENOUS | Status: DC
  Administered 2013-12-21: 62.5 mL/h via INTRAVENOUS

## 2013-12-20 MED ORDER — PHENYLEPHRINE 40 MCG/ML (10ML) SYRINGE FOR IV PUSH (FOR BLOOD PRESSURE SUPPORT)
80.0000 ug | PREFILLED_SYRINGE | INTRAVENOUS | Status: DC | PRN
  Filled 2013-12-20: qty 2

## 2013-12-20 MED ORDER — LACTATED RINGERS IV SOLN: 500.0000 mL | INTRAVENOUS | Status: DC | PRN

## 2013-12-20 MED ORDER — TERBUTALINE SULFATE 1 MG/ML IJ SOLN: 0.2500 mg | Freq: Once | INTRAMUSCULAR | Status: AC | PRN

## 2013-12-20 MED ORDER — PHENYLEPHRINE 40 MCG/ML (10ML) SYRINGE FOR IV PUSH (FOR BLOOD PRESSURE SUPPORT)
80.0000 ug | PREFILLED_SYRINGE | INTRAVENOUS | Status: DC | PRN
  Filled 2013-12-20: qty 10
  Filled 2013-12-20: qty 2

## 2013-12-20 NOTE — Anesthesia Preprocedure Evaluation (Signed)

## 2013-12-20 NOTE — H&P (Signed)
28 y.o. [redacted]w[redacted]d  G3P1011 comes in c/o decreased fetal movement.  She had a reactive NST in MAU, but still was not able to perceive fetal movement.  A BPP was performed with a score of 4/8.  Delivery was recommended by Dr. Lisbeth Renshaw, MFM.  Pt otherwise reports occ ctx, no LOF or VB.  Denies s/sx of HSV outbreak.   Past Medical History  Diagnosis Date  . Obesity   . Bronchitis   . Hand laceration     lt  . SVD (spontaneous vaginal delivery)     x 1  . Bronchitis     uses inhaler prn - rarely uses  . Meningitis   . [id number](563.8)     Past Surgical History  Procedure Laterality Date  . Wound exploration  03/14/2012    Procedure: WOUND EXPLORATION;  Surgeon: Tennis Must, MD;  Location: [place];  Service: Orthopedics;  Laterality: Left;  Left Hand Repair Flexor Tendon, Possible Nerve/Artery Repair , debridement of tendon  . Left hand surgery      tendon repair  . Dilation and evacuation N/A 08/16/2012    Procedure: DILATATION AND EVACUATION;  Surgeon: Delice Lesch, MD;  Location: [facility];  Service: Gynecology;  Laterality: N/A;    OB History  Gravida Para Term Preterm AB SAB TAB Ectopic Multiple Living  3 1 1  1 1    1     # Outcome Date GA Lbr Len/2nd Weight Sex Delivery Anes PTL Lv  3 CUR           2 SAB 07/2012             Comments: System Generated. Please review and update pregnancy details.  1 TRM 08/25/09    M SVD EPI  Y      History   Social History  . Marital Status: Single    Spouse Name: N/A    Number of Children: N/A  . Years of Education: N/A   Occupational History  . Not on file.   Social History Main Topics  . Smoking status: Never Smoker   . Smokeless tobacco: Never Used  . Alcohol Use: Yes     Comment: socially- [place]  . Drug Use: No  . Sexual Activity: Yes    Birth Control/ Protection: None     Comment: gestation approx 7 wks    Other Topics Concern  . Not on file   Social History Narrative  . No  narrative on file   Review of patient's allergies indicates no known allergies.    Prenatal Transfer Tool  Maternal Diabetes: No Genetic Screening: Declined Maternal Ultrasounds/Referrals: Normal Fetal Ultrasounds or other Referrals:  Other:  4/8 BPP Maternal Substance Abuse:  No Significant Maternal Medications:  Meds include: Other:  Valtrex Significant Maternal Lab Results: None  Other PNC: h/o HSV, on valtrex suppression.    Filed Vitals:   12/20/13 0925  BP: 124/68  Pulse: 74  Temp: 98.6 F (37 C)  Resp: 18     Lungs/Cor:  NAD Abdomen:  soft, gravid Ex:  no cords, erythema SVE:  1/30/soft/posterior FHTs: 140s, mod variability Cat 1  Toco:  q3-5 min w irritibility   A/P   28 yo G3P1011 @ [redacted]w[redacted]d with IOL 2/2 equivocal fetal testing at term  IOL:  Had rapid IOL w G1 (<12 hrs).  Given ctx frequency, will start pitocin, AROM when indicated. HSV-2: no lesions Epidural when desires GBS negative/vertex/6.5#/pelvis adequate  [place], [facility]

## 2013-12-20 NOTE — Anesthesia Procedure Notes (Signed)
Epidural Patient location during procedure: OB  Staffing Anesthesiologist: Montez Hageman Performed by: anesthesiologist   Preanesthetic Checklist Completed: patient identified, site marked, surgical consent, pre-op evaluation, timeout performed, IV checked, risks and benefits discussed and monitors and equipment checked  Epidural Patient position: sitting Prep: ChloraPrep Patient monitoring: heart rate, continuous pulse ox and blood pressure Approach: right paramedian Location: L3-L4 Injection technique: LOR saline  Needle:  Needle type: Tuohy  Needle gauge: 17 G Needle length: 9 cm and 9 Needle insertion depth: 6 cm Catheter type: closed end flexible Catheter size: 20 Guage Catheter at skin depth: 10 cm Test dose: negative  Assessment Events: blood not aspirated, injection not painful, no injection resistance, negative IV test and no paresthesia  Additional Notes   Patient tolerated the insertion well without complications.

## 2013-12-20 NOTE — MAU Provider Note (Signed)
Pt admitted to L&D for IOL 2/2 equivocal fetal testing at term.  Please see admission H&P.

## 2013-12-20 NOTE — MAU Note (Signed)
PT SAYS SHE HAS NOT FELT BABY MOVE AT ALL SINCE   Friday AM.    WENT TO DR ON MON-  ON MONITOR  VE IN OFFICE- 1 CM.   HAS HX OF HAV-  TAKING VALTREX- NO OUTBREAKS NOW.   GBS- NEG

## 2013-12-20 NOTE — Progress Notes (Signed)
Comfortable w epidural  Toco: q2-3 min EFM 130s mod variability, occ variables w ctx SVE: 3/80/-1, AROM clear fluid  28 yo G3P1011 @ [redacted]w[redacted]d w IOL 2/2 equivocal fetal testing Cont pitocin Anticipate SVD FSR/vtx/6.5#/gbs neg

## 2013-12-20 NOTE — MAU Provider Note (Signed)
Chief Complaint:  Decreased Fetal Movement   None     HPI: Tara Bender is a 28 y.o. G3P1011 at [redacted]w[redacted]d who presents to maternity admissions reporting no fetal movement x24 hours. Denies contractions, leakage of fluid or vaginal bleeding.    Pregnancy Course:   Past Medical History: Past Medical History  Diagnosis Date  . Obesity   . Bronchitis   . Hand laceration     lt  . SVD (spontaneous vaginal delivery)     x 1  . Bronchitis     uses inhaler prn - rarely uses  . Meningitis   . SLHTDSKA(768.1)     Past obstetric history: OB History  Gravida Para Term Preterm AB SAB TAB Ectopic Multiple Living  3 1 1  1 1    1     # Outcome Date GA Lbr Len/2nd Weight Sex Delivery Anes PTL Lv  3 CUR           2 SAB 07/2012             Comments: System Generated. Please review and update pregnancy details.  1 TRM 08/25/09    M SVD EPI  Y      Past Surgical History: Past Surgical History  Procedure Laterality Date  . Wound exploration  03/14/2012    Procedure: WOUND EXPLORATION;  Surgeon: Tennis Must, MD;  Location: [facility];  Service: Orthopedics;  Laterality: Left;  Left Hand Repair Flexor Tendon, Possible Nerve/Artery Repair , debridement of tendon  . Left hand surgery      tendon repair  . Dilation and evacuation N/A 08/16/2012    Procedure: DILATATION AND EVACUATION;  Surgeon: Delice Lesch, MD;  Location: [facility];  Service: Gynecology;  Laterality: N/A;     Family History: Family History  Problem Relation Age of Onset  . Hearing loss Mother   . Asthma Father     Social History: History  Substance Use Topics  . Smoking status: Never Smoker   . Smokeless tobacco: Never Used  . Alcohol Use: Yes     Comment: socially- DRINKS WINE- LAST TIME - NOV    Allergies: No Known Allergies  Meds:  Prescriptions prior to admission  Medication Sig Dispense Refill  . albuterol (PROVENTIL HFA;VENTOLIN HFA) 108 (90 BASE) MCG/ACT inhaler Inhale 1-2 puffs  into the lungs every 6 (six) hours as needed for wheezing or shortness of breath.  1 Inhaler  0  . butalbital-acetaminophen-caffeine (FIORICET, ESGIC) 50-325-40 MG per tablet Take 1 tablet by mouth as needed for headache.      . ondansetron (ZOFRAN) 4 MG tablet Take 4 mg by mouth every 8 (eight) hours as needed for nausea or vomiting.      . Pediatric Multivit-Minerals-C (FLINTSTONES GUMMIES PLUS PO) Take 2 each by mouth daily.      . valACYclovir (VALTREX) 500 MG tablet Take 500 mg by mouth daily.        No results found for this or any previous visit (from the past 48 hour(s)).   ROS: Pertinent findings in history of present illness.  Physical Exam  Blood pressure 119/67, pulse 74, temperature 98.2 F (36.8 C), temperature source Oral, resp. rate 20, height 5\' 3"  (1.6 m), weight 100.699 kg (222 lb), last menstrual period 03/23/2013. GENERAL: Well-developed, well-nourished female in no acute distress.  HEENT: normocephalic HEART: normal rate RESP: normal effort ABDOMEN: Soft, non-tender, gravid appropriate for gestational age EXTREMITIES: Nontender, no edema NEURO: alert and oriented  FHT:  Baseline 140 , moderate variability, accelerations present, several mild variable decelerations in the first 35 minutes a strip, resolved. Contractions: UI   MAU Course: Pt still not feeling any FM. Tried calling Dr. Carlis Abbott, no answer. Will order BPP and AFI.   Care of pt turned over to Tara Saupe, NP at [phone]. Pt in [place].   [place], [place] 12/20/2013 8:02 AM   Discussed BPP results with Dr. Carlis Abbott; 4/8 Admit to Labor and delivery HSV positive; suppression therapy started GBS negative   Patient informed and aware of plan of care Dr. Carlis Abbott to see patient on L&D  Tara Hillock Raeshaun [place], NP 12/20/2013 8:49 AM

## 2013-12-21 ENCOUNTER — Encounter (HOSPITAL_COMMUNITY): Payer: Self-pay | Admitting: *Deleted

## 2013-12-21 LAB — CBC
HCT: 26.6 % — ABNORMAL LOW (ref 36.0–46.0)
HEMOGLOBIN: 8.5 g/dL — AB (ref 12.0–15.0)
MCH: 23.2 pg — AB (ref 26.0–34.0)
MCHC: 32 g/dL (ref 30.0–36.0)
MCV: 72.5 fL — AB (ref 78.0–100.0)
Platelets: 211 10*3/uL (ref 150–400)
RBC: 3.67 MIL/uL — ABNORMAL LOW (ref 3.87–5.11)
RDW: 15.4 % (ref 11.5–15.5)
WBC: 10.1 10*3/uL (ref 4.0–10.5)

## 2013-12-21 MED ORDER — DIBUCAINE 1 % RE OINT: 1.0000 "application " | TOPICAL_OINTMENT | RECTAL | Status: DC | PRN

## 2013-12-21 MED ORDER — DIPHENHYDRAMINE HCL 25 MG PO CAPS: 25.0000 mg | ORAL_CAPSULE | Freq: Four times a day (QID) | ORAL | Status: DC | PRN

## 2013-12-21 MED ORDER — ONDANSETRON HCL 4 MG/2ML IJ SOLN: 4.0000 mg | INTRAMUSCULAR | Status: DC | PRN

## 2013-12-21 MED ORDER — PRENATAL MULTIVITAMIN CH
1.0000 | ORAL_TABLET | Freq: Every day | ORAL | Status: DC
  Administered 2013-12-21: 1 via ORAL
  Filled 2013-12-21: qty 1

## 2013-12-21 MED ORDER — TETANUS-DIPHTH-ACELL PERTUSSIS 5-2.5-18.5 LF-MCG/0.5 IM SUSP: 0.5000 mL | Freq: Once | INTRAMUSCULAR | Status: DC

## 2013-12-21 MED ORDER — ALBUTEROL SULFATE (2.5 MG/3ML) 0.083% IN NEBU: 3.0000 mL | INHALATION_SOLUTION | Freq: Four times a day (QID) | RESPIRATORY_TRACT | Status: DC | PRN

## 2013-12-21 MED ORDER — ONDANSETRON HCL 4 MG PO TABS: 4.0000 mg | ORAL_TABLET | ORAL | Status: DC | PRN

## 2013-12-21 MED ORDER — OXYCODONE-ACETAMINOPHEN 5-325 MG PO TABS
1.0000 | ORAL_TABLET | Freq: Once | ORAL | Status: AC
Start: 2013-12-21 — End: 2013-12-21
  Administered 2013-12-21: 1 via ORAL
  Filled 2013-12-21: qty 1

## 2013-12-21 MED ORDER — OXYCODONE-ACETAMINOPHEN 5-325 MG PO TABS
1.0000 | ORAL_TABLET | Freq: Four times a day (QID) | ORAL | Status: DC | PRN
  Administered 2013-12-21 – 2013-12-22 (×2): 1 via ORAL
  Filled 2013-12-21 (×2): qty 1

## 2013-12-21 MED ORDER — SENNOSIDES-DOCUSATE SODIUM 8.6-50 MG PO TABS
2.0000 | ORAL_TABLET | ORAL | Status: DC
  Administered 2013-12-21: 2 via ORAL
  Filled 2013-12-21: qty 2

## 2013-12-21 MED ORDER — BENZOCAINE-MENTHOL 20-0.5 % EX AERO: 1.0000 "application " | INHALATION_SPRAY | CUTANEOUS | Status: DC | PRN

## 2013-12-21 MED ORDER — LANOLIN HYDROUS EX OINT: TOPICAL_OINTMENT | CUTANEOUS | Status: DC | PRN

## 2013-12-21 MED ORDER — IBUPROFEN 600 MG PO TABS
600.0000 mg | ORAL_TABLET | Freq: Four times a day (QID) | ORAL | Status: DC
  Administered 2013-12-21 – 2013-12-22 (×5): 600 mg via ORAL
  Filled 2013-12-21 (×5): qty 1

## 2013-12-21 MED ORDER — OXYTOCIN 40 UNITS IN LACTATED RINGERS INFUSION - SIMPLE MED: 62.5000 mL/h | INTRAVENOUS | Status: DC | PRN

## 2013-12-21 MED ORDER — ZOLPIDEM TARTRATE 5 MG PO TABS: 5.0000 mg | ORAL_TABLET | Freq: Every evening | ORAL | Status: DC | PRN

## 2013-12-21 MED ORDER — WITCH HAZEL-GLYCERIN EX PADS: 1.0000 "application " | MEDICATED_PAD | CUTANEOUS | Status: DC | PRN

## 2013-12-21 MED ORDER — SIMETHICONE 80 MG PO CHEW: 80.0000 mg | CHEWABLE_TABLET | ORAL | Status: DC | PRN

## 2013-12-21 NOTE — Lactation Note (Signed)
This note was copied from the chart of Tara Noga Fogg. Lactation Consultation Note  P2, Ex BF.  Mother had expressed drops of colostrum. Assisted with football hold. Sucks and some swallows observed. Encouraged mother to massage breast to keep baby active. Mom encouraged to feed baby 8-12 times/24 hours and with feeding cues.  Mom made aware of O/P services, breastfeeding support groups, community resources, and our phone # for post-discharge questions.  Reviewed cluster feeding and encouraged mother to call if she needs further assistance.    Patient Name: Tara Bender HTXHF'[id number] Date: 12/21/2013 Reason for consult: Initial assessment   Maternal Data Has patient been taught Hand Expression?: Yes  Feeding Feeding Type: Breast Fed  LATCH Score/Interventions Latch: Grasps breast easily, tongue down, lips flanged, rhythmical sucking.  Audible Swallowing: A few with stimulation Intervention(s): Hand expression;Skin to skin Intervention(s): Alternate breast massage;Skin to skin  Type of Nipple: Everted at rest and after stimulation  Comfort (Breast/Nipple): Soft / non-tender     Hold (Positioning): Assistance needed to correctly position infant at breast and maintain latch. Intervention(s): Breastfeeding basics reviewed;Support Pillows;Skin to skin  LATCH Score: 8  Lactation Tools Discussed/Used     Consult Status Consult Status: Follow-up Date: 12/22/13 Follow-up type: In-patient    Vivianne Master [facility] 12/21/2013, 11:30 AM

## 2013-12-21 NOTE — Anesthesia Postprocedure Evaluation (Signed)
  Anesthesia Post-op Note  Patient: Tara Bender  Procedure(s) Performed: * No procedures listed *  Patient Location: Mother/Baby  Anesthesia Type:Epidural  Level of Consciousness: awake and alert   Airway and Oxygen Therapy: Patient Spontanous Breathing  Post-op Pain: mild  Post-op Assessment: Post-op Vital signs reviewed, Patient's Cardiovascular Status Stable, Respiratory Function Stable, No signs of Nausea or vomiting, Pain level controlled, No headache, No residual numbness and No residual motor weakness  Post-op Vital Signs: Reviewed  Last Vitals:  Filed Vitals:   12/21/13 0740  BP: 95/59  Pulse: 64  Temp: 36.9 C  Resp: 18    Complications: No apparent anesthesia complications

## 2013-12-21 NOTE — Progress Notes (Signed)
Patient is eating, ambulating, voiding.  Pain control is good. Bleeding is appropriate  Filed Vitals:   12/21/13 0031 12/21/13 0101 12/21/13 0224 12/21/13 0740  BP: 118/73 100/69 115/59 95/59  Pulse: 80 67 70 64  Temp:   98 F (36.7 C) 98.5 F (36.9 C)  TempSrc:    Oral  Resp: 20 18 18 18   Height:      Weight:      SpO2:       NAD Fundus firm Ext: tr edema b/l, symmetric  Lab Results  Component Value Date   WBC 10.1 12/21/2013   HGB 8.5* 12/21/2013   HCT 26.6* 12/21/2013   MCV 72.5* 12/21/2013   PLT 211 12/21/2013    Rh+ / Rubella immune  A/P [id number] PPD#1 Doing well. Expect d/c tomorrow.    [place], [facility]

## 2013-12-21 NOTE — Progress Notes (Signed)
Notified Dr Carlis Abbott patient requests something stronger than ibuprofen for painful cramping while breastfeeding, see new orders.

## 2013-12-22 ENCOUNTER — Ambulatory Visit: Payer: Self-pay

## 2013-12-22 NOTE — Discharge Summary (Signed)
Obstetric Discharge Summary Reason for Admission: induction of labor Prenatal Procedures: NST and ultrasound Intrapartum Procedures: spontaneous vaginal delivery Postpartum Procedures: none Complications-Operative and Postpartum: none Hemoglobin  Date Value Ref Range Status  12/21/2013 8.5* 12.0 - 15.0 g/dL Final     HCT  Date Value Ref Range Status  12/21/2013 26.6* 36.0 - 46.0 % Final    Physical Exam:  General: alert and cooperative Lochia: appropriate Uterine Fundus: firm DVT Evaluation: No evidence of DVT seen on physical exam.  Discharge Diagnoses: Term Pregnancy-delivered  Discharge Information: Date: 12/22/2013 Activity: pelvic rest Diet: routine Medications: PNV, Ibuprofen, Colace and Iron Condition: stable Instructions: refer to practice specific booklet Discharge to: home Follow-up Information   Follow up with Jerelyn Charles, MD In 4 weeks.   Specialty:  Obstetrics   Contact information:   [address] [place] [address] [phone]       Newborn Data: Live born female  Birth Weight: 6 lb 9 oz (2977 g) APGAR: 9, 9  Home with mother.  Allyn Kenner 12/22/2013, 9:21 AM

## 2013-12-22 NOTE — Progress Notes (Signed)
Patient is eating, ambulating, voiding.  Pain control is good.  Appropriate lochia.  No complaints.  Filed Vitals:   12/21/13 0740 12/21/13 1553 12/21/13 1723 12/22/13 0545  BP: 95/59 113/63 106/50 114/58  Pulse: 64 67 68 63  Temp: 98.5 F (36.9 C) 97.9 F (36.6 C) 98 F (36.7 C) 98.3 F (36.8 C)  TempSrc: Oral Oral Oral Oral  Resp: 18 20 18 18   Height:      Weight:      SpO2:        Fundus firm Perineum without swelling. No CT  Lab Results  Component Value Date   WBC 10.1 12/21/2013   HGB 8.5* 12/21/2013   HCT 26.6* 12/21/2013   MCV 72.5* 12/21/2013   PLT 211 12/21/2013    AB/Positive/-- (03/12 0000)  A/P Post partum day 2.  Routine care.  Anemia: Hb 8.5- rec. FeSO4 bid  Expect d/c today.    Tara Bender

## 2013-12-22 NOTE — Lactation Note (Signed)
This note was copied from the chart of Tara Bender. Lactation Consultation Note  Patient Name: Tara Bender [id number] Date: 12/22/2013 Reason for consult: Follow-up assessment Mom reports baby is nursing well, she is experienced BF. Had some mild nipple tenderness but this is resolving. Using comfort gels. Engorgement care reviewed if needed. Advised of OP services and support group. Mom denied other questions or concerns.   Maternal Data    Feeding Feeding Type: Breast Fed  LATCH Score/Interventions                      Lactation Tools Discussed/Used Tools: Comfort gels   Consult Status Consult Status: Complete Date: 12/22/13 Follow-up type: In-patient    Katrine Coho 12/22/2013, 12:59 PM

## 2014-01-20 ENCOUNTER — Other Ambulatory Visit: Payer: Self-pay | Admitting: Obstetrics

## 2014-01-20 DIAGNOSIS — K769 Liver disease, unspecified: Secondary | ICD-10-CM

## 2014-01-28 ENCOUNTER — Ambulatory Visit
Admission: RE | Admit: 2014-01-28 | Discharge: 2014-01-28 | Disposition: A | Discharge status: Home / Self Care | Payer: Medicaid Other | Source: Ambulatory Visit | Attending: Obstetrics | Admitting: Obstetrics

## 2014-01-28 DIAGNOSIS — K769 Liver disease, unspecified: Secondary | ICD-10-CM

## 2014-01-28 MED ORDER — GADOXETATE DISODIUM 0.25 MMOL/ML IV SOLN: 10.0000 mL | Freq: Once | INTRAVENOUS | Status: AC | PRN

## 2014-02-23 ENCOUNTER — Encounter (HOSPITAL_COMMUNITY): Payer: Self-pay | Admitting: *Deleted

## 2014-07-27 ENCOUNTER — Other Ambulatory Visit: Payer: Self-pay | Admitting: Obstetrics

## 2014-07-27 DIAGNOSIS — K769 Liver disease, unspecified: Secondary | ICD-10-CM

## 2014-08-17 ENCOUNTER — Other Ambulatory Visit: Payer: Medicaid Other

## 2014-08-28 ENCOUNTER — Other Ambulatory Visit: Payer: Medicaid Other

## 2014-09-08 ENCOUNTER — Ambulatory Visit
Admission: RE | Admit: 2014-09-08 | Discharge: 2014-09-08 | Disposition: A | Discharge status: Home / Self Care | Payer: Medicaid Other | Source: Ambulatory Visit | Attending: Obstetrics | Admitting: Obstetrics

## 2014-09-08 DIAGNOSIS — K769 Liver disease, unspecified: Secondary | ICD-10-CM

## 2014-09-08 MED ORDER — GADOBENATE DIMEGLUMINE 529 MG/ML IV SOLN: 20.0000 mL | Freq: Once | INTRAVENOUS | Status: AC | PRN

## 2015-02-21 ENCOUNTER — Emergency Department (HOSPITAL_COMMUNITY)
Admission: EM | Admit: 2015-02-21 | Discharge: 2015-02-21 | Disposition: A | Discharge status: Home / Self Care | Payer: PRIVATE HEALTH INSURANCE | Attending: Emergency Medicine | Admitting: Emergency Medicine

## 2015-02-21 ENCOUNTER — Emergency Department (HOSPITAL_COMMUNITY): Payer: PRIVATE HEALTH INSURANCE

## 2015-02-21 ENCOUNTER — Encounter (HOSPITAL_COMMUNITY): Payer: Self-pay | Admitting: *Deleted

## 2015-02-21 DIAGNOSIS — M79671 Pain in right foot: Secondary | ICD-10-CM

## 2015-02-21 DIAGNOSIS — E669 Obesity, unspecified: Secondary | ICD-10-CM | POA: Insufficient documentation

## 2015-02-21 DIAGNOSIS — Z87828 Personal history of other (healed) physical injury and trauma: Secondary | ICD-10-CM | POA: Diagnosis not present

## 2015-02-21 DIAGNOSIS — M79672 Pain in left foot: Secondary | ICD-10-CM | POA: Diagnosis present

## 2015-02-21 DIAGNOSIS — Z8709 Personal history of other diseases of the respiratory system: Secondary | ICD-10-CM | POA: Diagnosis not present

## 2015-02-21 DIAGNOSIS — Z8661 Personal history of infections of the central nervous system: Secondary | ICD-10-CM | POA: Diagnosis not present

## 2015-02-21 MED ORDER — IBUPROFEN 800 MG PO TABS: 800.0000 mg | ORAL_TABLET | Freq: Three times a day (TID) | ORAL | Status: DC | PRN

## 2015-02-21 NOTE — ED Provider Notes (Signed)
CSN: [id number]     Arrival date & time 02/21/15  1019 History   By signing my name below, I, Clement Sayres, attest that this documentation has been prepared under the direction and in the presence of Tuyen Uncapher, PA-C.  Electronically Signed: Clement Sayres, ED Scribe. 02/21/2015. 11:01 AM.    Chief Complaint  Patient presents with  . Foot Pain   The history is provided by the patient. No language interpreter was used.   HPI Comments: Tara Bender is a 29 y.o. female who presents to the Emergency Department complaining of constant, moderate, sharp/throbbing, tingling with touch pain on top of left foot that began 10 days ago without injury.She stated she took Tylenol without relief and she has not tried RICE. Pt denies ankle pain, leg swelling, fever, CP, and SOB.   Past Medical History  Diagnosis Date  . Obesity   . Bronchitis   . Hand laceration     lt  . SVD (spontaneous vaginal delivery)     x 1  . Bronchitis     uses inhaler prn - rarely uses  . Meningitis   . AGTXMIWO(032.1)    Past Surgical History  Procedure Laterality Date  . Wound exploration  03/14/2012    Procedure: WOUND EXPLORATION;  Surgeon: Tennis Must, MD;  Location: [facility];  Service: Orthopedics;  Laterality: Left;  Left Hand Repair Flexor Tendon, Possible Nerve/Artery Repair , debridement of tendon  . Left hand surgery      tendon repair  . Dilation and evacuation N/A 08/16/2012    Procedure: DILATATION AND EVACUATION;  Surgeon: Delice Lesch, MD;  Location: [facility];  Service: Gynecology;  Laterality: N/A;   Family History  Problem Relation Age of Onset  . Hearing loss Mother   . Asthma Father    Social History  Substance Use Topics  . Smoking status: Never Smoker   . Smokeless tobacco: Never Used  . Alcohol Use: Yes     Comment: socially- DRINKS WINE- LAST TIME - NOV   OB History    Gravida Para Term Preterm AB TAB SAB Ectopic Multiple Living   3 2 2  1  1   2       Review of Systems  Constitutional: Negative for fever and chills.  Respiratory: Negative for shortness of breath.   Cardiovascular: Negative for chest pain.  Musculoskeletal: Negative for gait problem.  Skin: Negative for color change, pallor, rash and wound.  Allergic/Immunologic: Negative for immunocompromised state.  Neurological: Negative for weakness and numbness.  Hematological: Does not bruise/bleed easily.  Psychiatric/Behavioral: Negative for self-injury.      Allergies  Review of patient's allergies indicates no known allergies.  Home Medications   Prior to Admission medications   Medication Sig Start Date End Date Taking? Authorizing Provider  albuterol (PROVENTIL HFA;VENTOLIN HFA) 108 (90 BASE) MCG/ACT inhaler Inhale 1-2 puffs into the lungs every 6 (six) hours as needed for wheezing or shortness of breath. 11/02/13   Elesa Massed, NP  butalbital-acetaminophen-caffeine (FIORICET, ESGIC) [phone] MG per tablet Take 1 tablet by mouth every 6 (six) hours as needed for headache.     Historical Provider, MD  Pediatric Multivit-Minerals-C (FLINTSTONES GUMMIES PLUS PO) Take 2 each by mouth daily.    Historical Provider, MD   BP 124/74 mmHg  Pulse 77  Temp(Src) 98.4 F (36.9 C) (Oral)  Resp 16  Ht 5\' 3"  (1.6 m)  Wt 235 lb (106.595 kg)  BMI 41.64 kg/m2  SpO2 100%  LMP  (LMP Unknown) Physical Exam  Constitutional: She appears well-developed and well-nourished. No distress.  HENT:  Head: Normocephalic and atraumatic.  Neck: Neck supple.  Cardiovascular:  Pulses:      Dorsalis pedis pulses are 2+ on the right side, and 2+ on the left side.       Posterior tibial pulses are 2+ on the right side, and 2+ on the left side.  Pulmonary/Chest: Effort normal.  Musculoskeletal: She exhibits edema.  Left foot:Full active range of motion of left foot and digits, strength 5/5, sensation intact, capillary refill < 2 seconds.  Soft swelling over the dorsal aspect of left foot  and 2nd metatarsal.  2nd MTP is tender to palpation.  No erythema or warmth, no tenderness of calvess and left ankle.   Neurological: She is alert.  Skin: She is not diaphoretic.  Nursing note and vitals reviewed.   ED Course  Procedures DIAGNOSTIC STUDIES: Oxygen Saturation is 100% on RA, normal by my interpretation.    10:58 AM COORDINATION OF CARE: Discussed treatment plan with pt which includes imaging of foot. Pt agreed to plan.   Labs Review Labs Reviewed - No data to display  Imaging Review Dg Foot Complete Left  02/21/2015  CLINICAL DATA:  Left foot pain to the 2nd metatarsal x1.5 weeks, no known injury EXAM: LEFT FOOT - COMPLETE 3+ VIEW COMPARISON:  None. FINDINGS: No fracture or dislocation is seen. The joint spaces are preserved. Mild dorsal soft tissue swelling along the forefoot. IMPRESSION: No fracture or dislocation is seen. Mild dorsal soft tissue swelling. Electronically Signed   By: Julian Hy M.D.   On: 02/21/2015 11:39   I have personally reviewed and evaluated these images and lab results as part of my medical decision-making.   EKG Interpretation None      MDM   Final diagnoses:  Right foot pain    Afebrile, nontoxic patient with soft swelling over dorsum of left foot without known injury, present x 10 days.  Neurovascularly intact.  Xray negative.   D/C home with ACE, RICE instructions, motrin. PCP follow up.  Discussed result, findings, treatment, and follow up  with patient.  Pt given return precautions.  Pt verbalizes understanding and agrees with plan.       I personally performed the services described in this documentation, which was scribed in my presence. The recorded information has been reviewed and is accurate.   Clayton Bibles, PA-C 02/21/15 [place]ir[place] Liu, MD 02/21/15 [phone]

## 2015-02-21 NOTE — ED Notes (Signed)
Pt shrugged shoulder to answer for start date of foot pain and as answer to if the Lt foot was injured. Pt presents with swelling to LT foot and pain .

## 2015-02-21 NOTE — ED Notes (Signed)
Declined W/C at D/C and was escorted to lobby by RN. 

## 2015-02-21 NOTE — Discharge Instructions (Signed)
Read the information below.  Use the prescribed medication as directed.  Please discuss all new medications with your pharmacist.  You may return to the Emergency Department at any time for worsening condition or any new symptoms that concern you.  If there is any possibility that you might be pregnant or you are breastfeeding, please let your health care provider know and discuss this with the pharmacist to ensure medication safety.  If you develop uncontrolled pain, weakness or numbness of the extremity, severe discoloration of the skin, or you are unable to walk , return to the ER for a recheck.

## 2015-09-11 ENCOUNTER — Encounter (HOSPITAL_COMMUNITY): Payer: Self-pay | Admitting: Nurse Practitioner

## 2015-09-11 ENCOUNTER — Emergency Department (HOSPITAL_COMMUNITY): Payer: Self-pay

## 2015-09-11 ENCOUNTER — Emergency Department (HOSPITAL_COMMUNITY)
Admission: EM | Admit: 2015-09-11 | Discharge: 2015-09-12 | Disposition: A | Discharge status: Home / Self Care | Payer: Self-pay | Attending: Emergency Medicine | Admitting: Emergency Medicine

## 2015-09-11 DIAGNOSIS — Z6841 Body Mass Index (BMI) 40.0 and over, adult: Secondary | ICD-10-CM | POA: Insufficient documentation

## 2015-09-11 DIAGNOSIS — E669 Obesity, unspecified: Secondary | ICD-10-CM | POA: Insufficient documentation

## 2015-09-11 DIAGNOSIS — R091 Pleurisy: Secondary | ICD-10-CM | POA: Insufficient documentation

## 2015-09-11 LAB — BASIC METABOLIC PANEL
ANION GAP: 6 (ref 5–15)
BUN: 12 mg/dL (ref 6–20)
CALCIUM: 10 mg/dL (ref 8.9–10.3)
CO2: 27 mmol/L (ref 22–32)
CREATININE: 0.77 mg/dL (ref 0.44–1.00)
Chloride: 104 mmol/L (ref 101–111)
GFR calc non Af Amer: >60 mL/min (ref >60)
Glucose, Bld: 97 mg/dL (ref 65–99)
Potassium: 3.6 mmol/L (ref 3.5–5.1)
Sodium: 137 mmol/L (ref 135–145)

## 2015-09-11 LAB — CBC
HEMATOCRIT: 37.7 % (ref 36.0–46.0)
Hemoglobin: 11.9 g/dL — ABNORMAL LOW (ref 12.0–15.0)
MCH: 25.3 pg — ABNORMAL LOW (ref 26.0–34.0)
MCHC: 31.6 g/dL (ref 30.0–36.0)
MCV: 80 fL (ref 78.0–100.0)
Platelets: 269 10*3/uL (ref 150–400)
RBC: 4.71 MIL/uL (ref 3.87–5.11)
RDW: 14.8 % (ref 11.5–15.5)
WBC: 10.8 10*3/uL — ABNORMAL HIGH (ref 4.0–10.5)

## 2015-09-11 LAB — I-STAT TROPONIN, ED: TROPONIN I, POC: 0 ng/mL (ref 0.00–0.08)

## 2015-09-11 NOTE — ED Notes (Signed)
Pt has a c/o right chest pain that radiates to the back. Also reports recent cough and URI symptoms that self limited with time, the chest pain is also causing her shortness of breath, denies any other symptoms.

## 2015-09-12 ENCOUNTER — Emergency Department (HOSPITAL_COMMUNITY): Payer: Self-pay

## 2015-09-12 LAB — D-DIMER, QUANTITATIVE: D-Dimer, Quant: 0.52 ug/mL-FEU — ABNORMAL HIGH (ref 0.00–0.50)

## 2015-09-12 LAB — POC URINE PREG, ED: Preg Test, Ur: NEGATIVE

## 2015-09-12 MED ORDER — IBUPROFEN 600 MG PO TABS: 600.0000 mg | ORAL_TABLET | Freq: Four times a day (QID) | ORAL | Status: DC | PRN

## 2015-09-12 MED ORDER — IBUPROFEN 800 MG PO TABS
800.0000 mg | ORAL_TABLET | Freq: Once | ORAL | Status: AC
  Administered 2015-09-12: 800 mg via ORAL
  Filled 2015-09-12: qty 1

## 2015-09-12 MED ORDER — IOPAMIDOL (ISOVUE-370) INJECTION 76%
100.0000 mL | Freq: Once | INTRAVENOUS | Status: AC | PRN
  Administered 2015-09-12: 100 mL via INTRAVENOUS

## 2015-09-12 NOTE — Discharge Instructions (Signed)
Pleurisy  Pleurisy is an inflammation and swelling of the lining of the lungs (pleura). Because of this inflammation, it hurts to breathe. It can be aggravated by coughing, laughing, or deep breathing. Pleurisy is often caused by an underlying infection or disease.   HOME CARE INSTRUCTIONS   Monitor your pleurisy for any changes. The following actions may help to alleviate any discomfort you are experiencing:  · Medicine may help with pain. Only take over-the-counter or prescription medicines for pain, discomfort, or fever as directed by your health care provider.  · Only take antibiotic medicine as directed. Make sure to finish it even if you start to feel better.  SEEK MEDICAL CARE IF:   · Your pain is not controlled with medicine or is increasing.  · You have an increase in pus-like (purulent) secretions brought up with coughing.  SEEK IMMEDIATE MEDICAL CARE IF:   · You have blue or dark lips, fingernails, or toenails.  · You are coughing up blood.  · You have increased difficulty breathing.  · You have continuing pain unrelieved by medicine or pain lasting more than 1 week.  · You have pain that radiates into your neck, arms, or jaw.  · You develop increased shortness of breath or wheezing.  · You develop a fever, rash, vomiting, fainting, or other serious symptoms.  MAKE SURE YOU:  · Understand these instructions.    · Will watch your condition.    · Will get help right away if you are not doing well or get worse.        This information is not intended to replace advice given to you by your health care provider. Make sure you discuss any questions you have with your health care provider.     Document Released: 04/10/2005 Document Revised: 12/11/2012 Document Reviewed: 09/22/2012  [employer]vier Interactive Patient Education ©2016 [employer]

## 2015-09-12 NOTE — ED Notes (Signed)
MD at bedside. 

## 2015-09-12 NOTE — ED Provider Notes (Signed)
CSN: [id number]     Arrival date & time 09/11/15  2147 History   By signing my name below, I, Randa Evens, attest that this documentation has been prepared under the direction and in the presence of Ripley Fraise, MD. Electronically Signed: Randa Evens, ED Scribe. 09/12/2015. 3:10 AM.      Chief Complaint  Patient presents with  . Chest Pain   The history is provided by the patient. No language interpreter was used.   HPI Comments: ZAYDEE GOUIN is a 30 y.o. female who presents to the Emergency Department complaining of right sided CP onset 23 hours prior. Pt states she woke up with the CP. Reports associated intermittent SOB. Pt states that since being in the ED she has developed a HA. Pt states that the chest pain radiates around to her side and back. Pt denies any alleviating factors. Pt states deep breathing makes the pain worse. Pt doesn't report any medications PTA. Denies fever, cough, vomiting, diaphoresis, syncope, abdominal pain or leg swelling.  Denies recent travel or surgery. Denies fall or injury.    Past Medical History  Diagnosis Date  . Obesity   . Bronchitis   . Hand laceration     lt  . SVD (spontaneous vaginal delivery)     x 1  . Bronchitis     uses inhaler prn - rarely uses  . Meningitis   . [id number]    Past Surgical History  Procedure Laterality Date  . Wound exploration  03/14/2012    Procedure: WOUND EXPLORATION;  Surgeon: Tennis Must, MD;  Location: [facility];  Service: Orthopedics;  Laterality: Left;  Left Hand Repair Flexor Tendon, Possible Nerve/Artery Repair , debridement of tendon  . Left hand surgery      tendon repair  . Dilation and evacuation N/A 08/16/2012    Procedure: DILATATION AND EVACUATION;  Surgeon: Delice Lesch, MD;  Location: [facility];  Service: Gynecology;  Laterality: N/A;   Family History  Problem Relation Age of Onset  . Hearing loss Mother   . Asthma Father    Social History   Substance Use Topics  . Smoking status: Never Smoker   . Smokeless tobacco: Never Used  . Alcohol Use: Yes     Comment: socially- DRINKS WINE- LAST TIME - NOV   OB History    Gravida Para Term Preterm AB TAB SAB Ectopic Multiple Living   3 2 2  1  1   2       Review of Systems  Constitutional: Negative for fever and diaphoresis.  Respiratory: Positive for shortness of breath. Negative for cough.   Cardiovascular: Positive for chest pain. Negative for leg swelling.  Gastrointestinal: Negative for vomiting and abdominal pain.  Neurological: Positive for headaches. Negative for syncope.  All other systems reviewed and are negative.    Allergies  Review of patient's allergies indicates no known allergies.  Home Medications   Prior to Admission medications   Not on File   BP 133/87 mmHg  Pulse 62  Temp(Src) 98 F (36.7 C) (Oral)  Resp 17  Ht 5\' 4"  (1.626 m)  Wt 256 lb 3.2 oz (116.212 kg)  BMI 43.96 kg/m2  SpO2 100%  LMP  (LMP Unknown)   Physical Exam CONSTITUTIONAL: Well developed/well nourished HEAD: Normocephalic/atraumatic EYES: EOMI/PERRL ENMT: Mucous membranes moist NECK: supple no meningeal signs SPINE/BACK:entire spine nontender CV: S1/S2 noted, no murmurs/rubs/gallops noted LUNGS: Lungs are clear to auscultation bilaterally, no apparent distress ABDOMEN: soft,  nontender, no rebound or guarding, bowel sounds noted throughout abdomen GU:no cva tenderness NEURO: Pt is awake/alert/appropriate, moves all extremitiesx4.  No facial droop.   EXTREMITIES: pulses normal/equal, full ROM, no calf tenderness noted. SKIN: warm, color normal PSYCH: no abnormalities of mood noted, alert and oriented to situation  ED Course  Procedures  DIAGNOSTIC STUDIES: Oxygen Saturation is 100% on RA, normal by my interpretation.    COORDINATION OF CARE: 3:09 AM-Discussed treatment plan with pt at bedside and pt agreed to plan.    Low suspicion for ACS, negative troponin with  unremarkable EKG She had pleuritic CP, with elevated d-dimer CT chest negative Pt stable in the ED Will d/c home   Labs Review Labs Reviewed  CBC - Abnormal; Notable for the following:    WBC 10.8 (*)    Hemoglobin 11.9 (*)    MCH 25.3 (*)    All other components within normal limits  D-DIMER, QUANTITATIVE (NOT AT [facility]) - Abnormal; Notable for the following:    D-Dimer, Quant 0.52 (*)    All other components within normal limits  BASIC METABOLIC PANEL  I-STAT TROPOININ, ED  POC URINE PREG, ED    Imaging Review Dg Chest 2 View  09/11/2015  CLINICAL DATA:  RIGHT chest pain radiating to back with shortness of breath for 1 day. EXAM: CHEST  2 VIEW COMPARISON:  CT chest November 02, 2013 FINDINGS: Cardiomediastinal silhouette is normal. Minimal atelectasis RIGHT costophrenic angle. The lungs are clear without pleural effusions or focal consolidations. Trachea projects midline and there is no pneumothorax. Soft tissue planes and included osseous structures are non-suspicious. IMPRESSION: Minimal RIGHT lung base atelectasis. Electronically Signed   By: Elon Alas M.D.   On: 09/11/2015 23:24   Ct Angio Chest Pe W/cm &/or Wo Cm  09/12/2015  CLINICAL DATA:  Right-sided chest pain radiating to the back. Cough and upper respiratory tract symptoms. Shortness of breath. D-dimer 0.52 EXAM: CT ANGIOGRAPHY CHEST WITH CONTRAST TECHNIQUE: Multidetector CT imaging of the chest was performed using the standard protocol during bolus administration of intravenous contrast. Multiplanar CT image reconstructions and MIPs were obtained to evaluate the vascular anatomy. CONTRAST:  100 mL Isovue 370 COMPARISON:  11/02/2013 FINDINGS: Technically adequate study with good opacification of the central and segmental pulmonary arteries. No focal filling defects are demonstrated. No evidence of significant pulmonary embolus. Normal heart size. Normal caliber thoracic aorta. Residual thymic tissue in the anterior  mediastinum. Esophagus is decompressed. No significant lymphadenopathy in the chest. Mild dependent changes in the lung bases. No focal airspace disease or consolidation in the lungs. No pleural effusions. No pneumothorax. Included portions of the upper abdominal organs are grossly unremarkable. No destructive bone lesions. Review of the MIP images confirms the above findings. IMPRESSION: No evidence of significant pulmonary embolus. No evidence of active pulmonary disease. Electronically Signed   By: Lucienne Capers M.D.   On: 09/12/2015 05:50   I have personally reviewed and evaluated these images and lab results as part of my medical decision-making.   EKG Interpretation   Date/Time:  Saturday Sep 11 2015 21:56:00 EDT Ventricular Rate:  82 PR Interval:  139 QRS Duration: 86 QT Interval:  356 QTC Calculation: 416 R Axis:   70 Text Interpretation:  Sinus rhythm Borderline T abnormalities, anterior  leads No significant change since last tracing Confirmed by Christy Gentles  MD,  Vanshika Jastrzebski ([id number]) on 09/12/2015 2:52:59 AM      MDM   Final diagnoses:  Pleurisy  Nursing notes including past medical history and social history reviewed and considered in documentation xrays/imaging reviewed by myself and considered during evaluation Labs/vital reviewed myself and considered during evaluation   I personally performed the services described in this documentation, which was scribed in my presence. The recorded information has been reviewed and is accurate.        Ripley Fraise, MD 09/12/15 [phone]

## 2016-01-12 ENCOUNTER — Encounter (HOSPITAL_COMMUNITY): Payer: Self-pay

## 2016-01-12 ENCOUNTER — Emergency Department (HOSPITAL_COMMUNITY)
Admission: EM | Admit: 2016-01-12 | Discharge: 2016-01-12 | Disposition: A | Discharge status: Home / Self Care | Payer: 59 | Attending: Emergency Medicine | Admitting: Emergency Medicine

## 2016-01-12 DIAGNOSIS — Z79899 Other long term (current) drug therapy: Secondary | ICD-10-CM | POA: Insufficient documentation

## 2016-01-12 DIAGNOSIS — R51 Headache: Secondary | ICD-10-CM | POA: Insufficient documentation

## 2016-01-12 DIAGNOSIS — R110 Nausea: Secondary | ICD-10-CM | POA: Insufficient documentation

## 2016-01-12 DIAGNOSIS — R630 Anorexia: Secondary | ICD-10-CM | POA: Insufficient documentation

## 2016-01-12 DIAGNOSIS — R519 Headache, unspecified: Secondary | ICD-10-CM

## 2016-01-12 DIAGNOSIS — Z791 Long term (current) use of non-steroidal anti-inflammatories (NSAID): Secondary | ICD-10-CM | POA: Insufficient documentation

## 2016-01-12 MED ORDER — DEXAMETHASONE SODIUM PHOSPHATE 10 MG/ML IJ SOLN
10.0000 mg | Freq: Once | INTRAMUSCULAR | Status: AC
Start: 2016-01-12 — End: 2016-01-12
  Administered 2016-01-12: 10 mg via INTRAVENOUS
  Filled 2016-01-12: qty 1

## 2016-01-12 MED ORDER — DIPHENHYDRAMINE HCL 50 MG/ML IJ SOLN
25.0000 mg | Freq: Once | INTRAMUSCULAR | Status: AC
  Administered 2016-01-12: 25 mg via INTRAVENOUS
  Filled 2016-01-12: qty 1

## 2016-01-12 MED ORDER — SODIUM CHLORIDE 0.9 % IV BOLUS (SEPSIS)
1000.0000 mL | Freq: Once | INTRAVENOUS | Status: AC
  Administered 2016-01-12: 1000 mL via INTRAVENOUS

## 2016-01-12 MED ORDER — PROCHLORPERAZINE EDISYLATE 5 MG/ML IJ SOLN
10.0000 mg | Freq: Once | INTRAMUSCULAR | Status: AC
  Administered 2016-01-12: 10 mg via INTRAVENOUS
  Filled 2016-01-12: qty 2

## 2016-01-12 NOTE — ED Provider Notes (Signed)
[facility] DEPT Provider Note   CSN: [id number] Arrival date & time: 01/12/16  [id number]     History   Chief Complaint Chief Complaint  Patient presents with  . Headache    HPI Tara Bender is a 30 y.o. female the past medical history significant for headaches and one episode of viral meningitis who presents with headache. Patient reports that she has had headache for the last 3 days that has been dull and intermittently sharp. Patient reports this feels "very different" than her last episode of viral meningitis. Patient reports that she has tried to take ibuprofen without relief. Patient says that she has had chills but no fevers. Patient reports some nausea but no vomiting. Patient denies any vision changes, neurologic complaints such as numbness, tingling, weakness, or coordination problems. Patient denies neck stiffness. Patient does report associated photophobia. Patient reports decreased by mouth intake over the last 3 days in a setting of her headache. Patient denies recent trauma. Patient denies chest pain, shortness of breath, constipation, diarrhea, dysuria, vaginal discharge, or vaginal bleeding.  Headache   This is a recurrent problem. The current episode started more than 2 days ago. The problem occurs constantly. The problem has not changed since onset.The headache is associated with bright light. The pain is located in the frontal region. The quality of the pain is described as dull and sharp. The pain is at a severity of 10/10. The pain is severe. The pain does not radiate. Associated symptoms include anorexia and nausea. Pertinent negatives include no fever, no malaise/fatigue, no chest pressure, no near-syncope, no palpitations, no syncope, no shortness of breath and no vomiting. She has tried NSAIDs for the symptoms. The treatment provided no relief.    Past Medical History:  Diagnosis Date  . Bronchitis   . Bronchitis    uses inhaler prn - rarely uses  . Hand  laceration    lt  . Headache(784.0)   . Meningitis   . Obesity   . SVD (spontaneous vaginal delivery)    x 1    Patient Active Problem List   Diagnosis Date Noted  . Normal vaginal delivery 12/21/2013  . Decreased fetal movement 12/20/2013  . Decreased fetal movement determined by examination 12/20/2013    Past Surgical History:  Procedure Laterality Date  . DILATION AND EVACUATION N/A 08/16/2012   Procedure: DILATATION AND EVACUATION;  Surgeon: Delice Lesch, MD;  Location: [facility];  Service: Gynecology;  Laterality: N/A;  . left hand surgery     tendon repair  . WOUND EXPLORATION  03/14/2012   Procedure: WOUND EXPLORATION;  Surgeon: Tennis Must, MD;  Location: [facility];  Service: Orthopedics;  Laterality: Left;  Left Hand Repair Flexor Tendon, Possible Nerve/Artery Repair , debridement of tendon    OB History    Gravida Para Term Preterm AB Living   3 2 2   1 2    SAB TAB Ectopic Multiple Live Births   1       2       Home Medications    Prior to Admission medications   Medication Sig Start Date End Date Taking? Authorizing Provider  ibuprofen (ADVIL,MOTRIN) 600 MG tablet Take 1 tablet (600 mg total) by mouth every 6 (six) hours as needed. 09/12/15  Yes Ripley Fraise, MD  levonorgestrel (MIRENA) 20 MCG/24HR IUD 1 each by Intrauterine route continuous.   Yes Historical Provider, MD  naproxen (NAPROSYN) 500 MG tablet Take 500-1,000 mg by mouth daily as  needed for moderate pain.   Yes Historical Provider, MD    Family History Family History  Problem Relation Age of Onset  . Hearing loss Mother   . Asthma Father     Social History Social History  Substance Use Topics  . Smoking status: Never Smoker  . Smokeless tobacco: Never Used  . Alcohol use Yes     Comment: socially- DRINKS WINE- LAST TIME - NOV     Allergies   Review of patient's allergies indicates no known allergies.   Review of Systems Review of Systems  Constitutional:  Negative for fever and malaise/fatigue.  HENT: Negative for congestion and rhinorrhea.   Eyes: Positive for photophobia. Negative for visual disturbance.  Respiratory: Negative for cough, chest tightness, shortness of breath, wheezing and stridor.   Cardiovascular: Negative for chest pain, palpitations, syncope and near-syncope.  Gastrointestinal: Positive for anorexia and nausea. Negative for abdominal pain, constipation, diarrhea and vomiting.  Genitourinary: Negative for dysuria, flank pain and frequency.  Musculoskeletal: Negative for back pain, gait problem, neck pain and neck stiffness.  Neurological: Positive for headaches. Negative for dizziness, tremors, seizures, syncope, weakness, light-headedness and numbness.  Psychiatric/Behavioral: Negative for agitation.  All other systems reviewed and are negative.    Physical Exam Updated Vital Signs BP 127/78 (BP Location: Right Arm)   Pulse 78   Temp 99 F (37.2 C) (Oral)   Resp 16   Ht 5\' 3"  (1.6 m)   Wt 240 lb (108.9 kg)   BMI 42.51 kg/m   Physical Exam  Constitutional: She is oriented to person, place, and time. She appears well-developed and well-nourished. No distress.  HENT:  Head: Normocephalic and atraumatic.  Mouth/Throat: Oropharynx is clear and moist. No oropharyngeal exudate.  Eyes: Conjunctivae and EOM are normal. Pupils are equal, round, and reactive to light.  Neck: Normal range of motion. Neck supple.  Cardiovascular: Normal rate and regular rhythm.   No murmur heard. Pulmonary/Chest: Effort normal and breath sounds normal. No stridor. No respiratory distress. She exhibits no tenderness.  Abdominal: Soft. There is no tenderness. There is no guarding.  Musculoskeletal: She exhibits no edema or tenderness.  Neurological: She is alert and oriented to person, place, and time. She has normal reflexes. She is not disoriented. She displays no tremor and normal reflexes. No cranial nerve deficit or sensory deficit.  She exhibits normal muscle tone. Coordination normal.  Unremarkable neurologic exam. No nuchal rigidity.   Skin: Skin is warm and dry. She is not diaphoretic. No erythema.  Psychiatric: She has a normal mood and affect. Her behavior is normal.  Nursing note and vitals reviewed.    ED Treatments / Results  Labs (all labs ordered are listed, but only abnormal results are displayed) Labs Reviewed - No data to display  EKG  EKG Interpretation None       Radiology No results found.  Procedures Procedures (including critical care time)  Medications Ordered in ED Medications  sodium chloride 0.9 % bolus 1,000 mL (0 mLs Intravenous Stopped 01/12/16 1327)  prochlorperazine (COMPAZINE) injection 10 mg (10 mg Intravenous Given 01/12/16 1127)  diphenhydrAMINE (BENADRYL) injection 25 mg (25 mg Intravenous Given 01/12/16 1126)  dexamethasone (DECADRON) injection 10 mg (10 mg Intravenous Given 01/12/16 1126)     Initial Impression / Assessment and Plan / ED Course  I have reviewed the triage vital signs and the nursing notes.  Pertinent labs & imaging results that were available during my care of the patient were reviewed by me  and considered in my medical decision making (see chart for details).  Clinical Course    Tara Bender is a 30 y.o. female the past medical history significant for headaches and one episode of viral meningitis who presents with headache. History and exam as above. Patient had extremely reassuring physical exam in regards to her neurologic exam. Patient had no focal neurologic deficits, no vision changes, no nuchal rigidity. Given absence of similarities to patient's prior episodes of meningitis and, patient's description of anything similar to prior headaches, patient will be treated with a headache cocktail and reassessed.  Patient reassessed following Medication administration. Patient reported complete resolution of headache. Patient able to ambulate without  difficulty and had normal neurologic exam on reassessment.  Given resolution of symptoms, patient felt appropriate for discharge. Patient given return precautions and plans to follow up with PCP for further headache management. Patient discharged in good condition.   Final Clinical Impressions(s) / ED Diagnoses   Final diagnoses:  Acute nonintractable headache, unspecified headache type    New Prescriptions Discharge Medication List as of 01/12/2016  1:21 PM      Clinical Impression: 1. Acute nonintractable headache, unspecified headache type     Disposition: Discharge  Condition: Good  I have discussed the results, Dx and Tx plan with the pt(& family if present). He/she/they expressed understanding and agree(s) with the plan. Discharge instructions discussed at great length. Strict return precautions discussed and pt &/or family have verbalized understanding of the instructions. No further questions at time of discharge.    Discharge Medication List as of 01/12/2016  1:21 PM      Follow Up: [place] [facility] [place] [phone] Schedule an appointment as soon as possible for a visit  If symptoms worsen, please return to the nearest ED.     Gwenyth Allegra Arlander Gillen, MD 01/12/16 2020

## 2016-01-12 NOTE — ED Triage Notes (Signed)
She c/o generalized h/a with occasional chills since Mon. She also mentions hx of viral meningitis in 2014. She is alert and oriented x 4 with clear speech.

## 2016-11-15 ENCOUNTER — Encounter (HOSPITAL_COMMUNITY): Payer: Self-pay | Admitting: Emergency Medicine

## 2016-11-15 ENCOUNTER — Emergency Department (HOSPITAL_COMMUNITY)
Admission: EM | Admit: 2016-11-15 | Discharge: 2016-11-16 | Discharge status: Against Medical Advice | Payer: PRIVATE HEALTH INSURANCE | Attending: Emergency Medicine | Admitting: Emergency Medicine

## 2016-11-15 DIAGNOSIS — R202 Paresthesia of skin: Secondary | ICD-10-CM | POA: Insufficient documentation

## 2016-11-15 DIAGNOSIS — Z5321 Procedure and treatment not carried out due to patient leaving prior to being seen by health care provider: Secondary | ICD-10-CM | POA: Insufficient documentation

## 2016-11-15 NOTE — ED Triage Notes (Signed)
Attempted to call patient for assigned room with no answer.

## 2016-11-15 NOTE — ED Triage Notes (Signed)
Pt reports was at work when she started felling tingling and numbness right arm 2 hours ago. Denies injury to arm or lifting. Denies chest pain nor shortness of  Breath.

## 2016-11-15 NOTE — ED Notes (Signed)
Pt called for room placement without response 

## 2017-01-16 ENCOUNTER — Emergency Department (HOSPITAL_COMMUNITY)
Admission: EM | Admit: 2017-01-16 | Discharge: 2017-01-16 | Disposition: A | Discharge status: Home / Self Care | Payer: 59 | Attending: Emergency Medicine | Admitting: Emergency Medicine

## 2017-01-16 ENCOUNTER — Encounter (HOSPITAL_COMMUNITY): Payer: Self-pay | Admitting: *Deleted

## 2017-01-16 DIAGNOSIS — Y998 Other external cause status: Secondary | ICD-10-CM | POA: Diagnosis not present

## 2017-01-16 DIAGNOSIS — Y929 Unspecified place or not applicable: Secondary | ICD-10-CM | POA: Diagnosis not present

## 2017-01-16 DIAGNOSIS — W57XXXA Bitten or stung by nonvenomous insect and other nonvenomous arthropods, initial encounter: Secondary | ICD-10-CM | POA: Insufficient documentation

## 2017-01-16 DIAGNOSIS — Y9389 Activity, other specified: Secondary | ICD-10-CM | POA: Insufficient documentation

## 2017-01-16 DIAGNOSIS — Z5321 Procedure and treatment not carried out due to patient leaving prior to being seen by health care provider: Secondary | ICD-10-CM | POA: Diagnosis not present

## 2017-01-16 DIAGNOSIS — S60463A Insect bite (nonvenomous) of left middle finger, initial encounter: Secondary | ICD-10-CM | POA: Diagnosis present

## 2017-01-16 NOTE — ED Notes (Signed)
Mom sts she was going to go home with her children, and follow back up later if needed

## 2017-01-16 NOTE — ED Triage Notes (Signed)
Stung by yellow jacket about 20 minutes ago to left middle finger. NAD

## 2017-06-29 DIAGNOSIS — Z124 Encounter for screening for malignant neoplasm of cervix: Secondary | ICD-10-CM | POA: Diagnosis not present

## 2017-06-29 DIAGNOSIS — Z01419 Encounter for gynecological examination (general) (routine) without abnormal findings: Secondary | ICD-10-CM | POA: Diagnosis not present

## 2017-09-22 IMAGING — CT CT ANGIO CHEST
2 of 6 series · 17 of 46 positions shown · IV contrast (isovue)
Comparison: 11/02/2013

CLINICAL DATA: Right-sided chest pain radiating to the back. Cough
and upper respiratory tract symptoms. Shortness of breath. D-dimer
0.52

EXAM:
CT ANGIOGRAPHY CHEST WITH CONTRAST
TECHNIQUE: Multidetector CT imaging of the chest was performed using the
standard protocol during bolus administration of intravenous
contrast. Multiplanar CT image reconstructions and MIPs were
obtained to evaluate the vascular anatomy.
CONTRAST:  100 mL Isovue 370

[Series 6: thins for pacs · axial · 0.74mm/px · z∈[-242,-33]mm · 14 of 229 slices shown]
[im 10/229  lung]
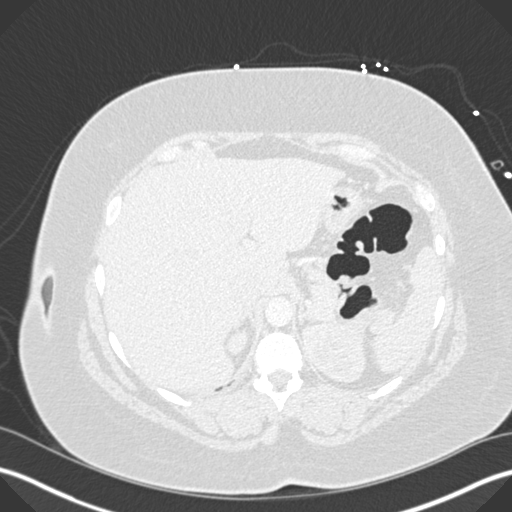
[im 30/229  soft-tissue]
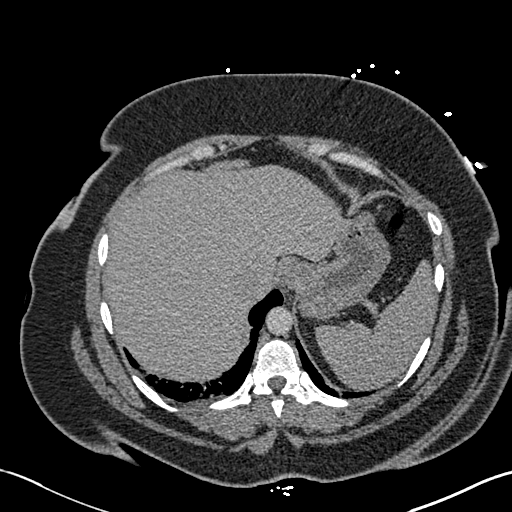
[im 40/229  lung]
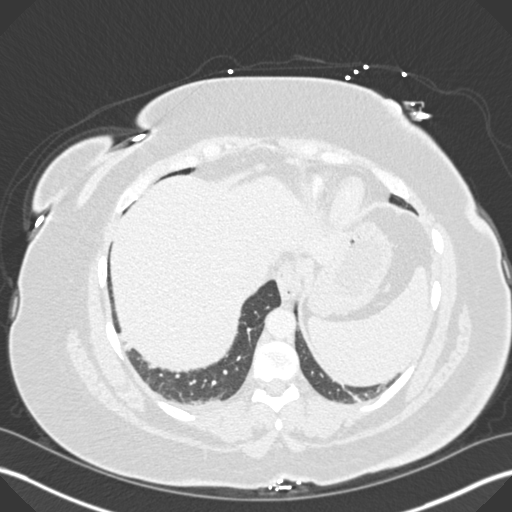
[im 60/229  soft-tissue]
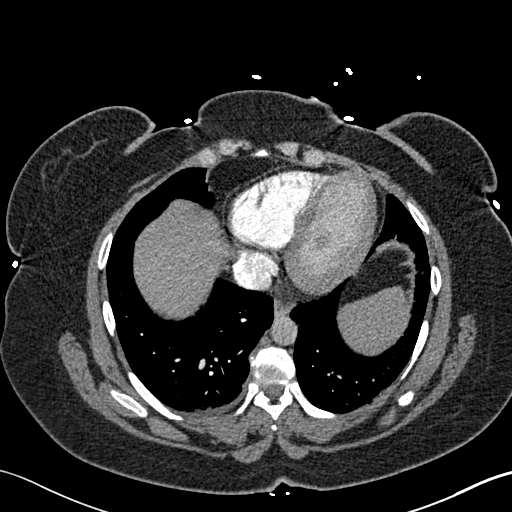
[im 80/229  lung]
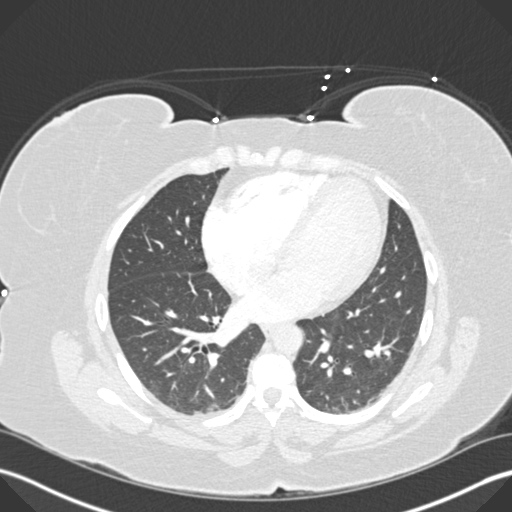
[im 90/229  soft-tissue]
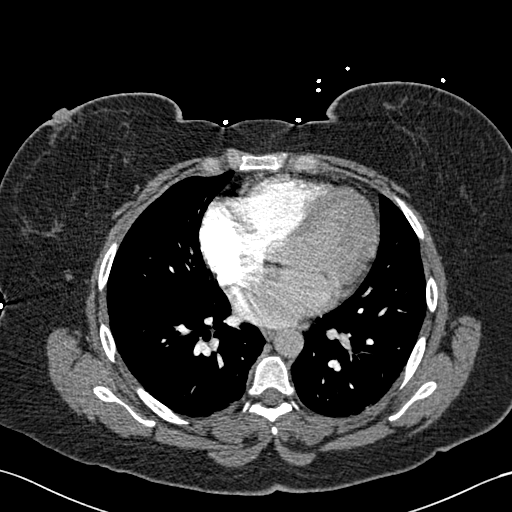
[im 110/229  lung]
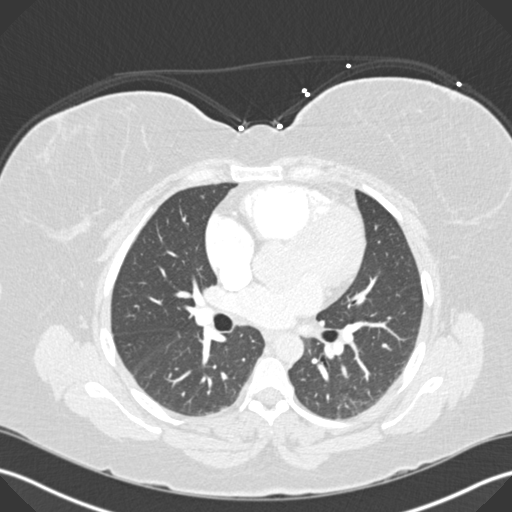
[im 119/229  soft-tissue]
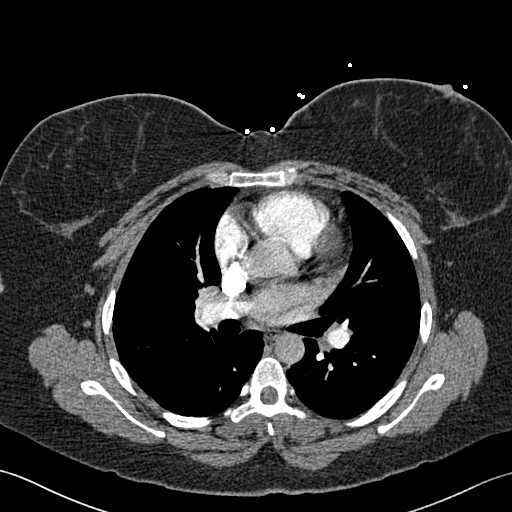
[im 139/229  lung]
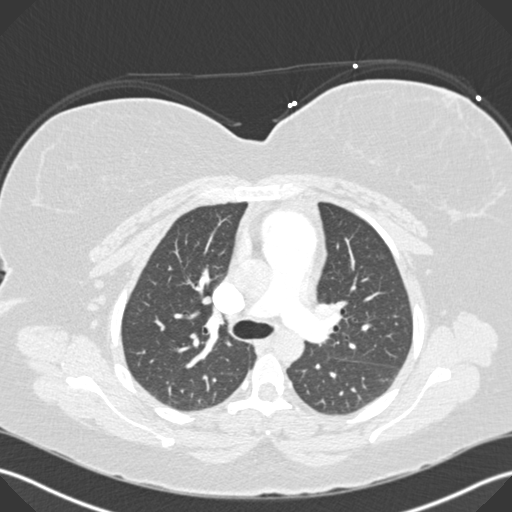
[im 149/229  soft-tissue]
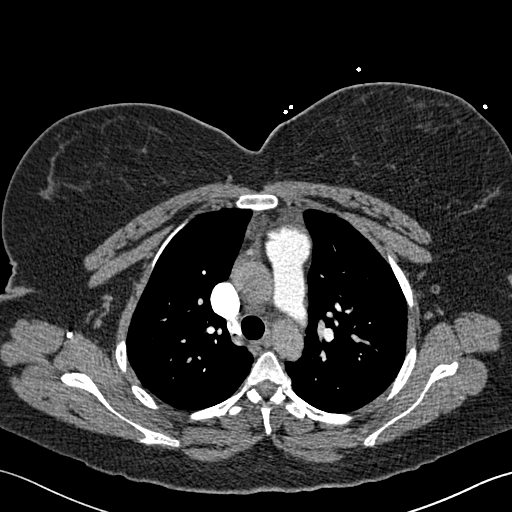
[im 169/229  lung]
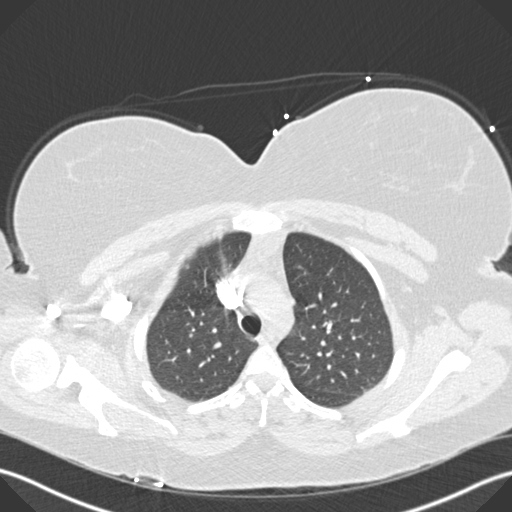
[im 189/229  soft-tissue]
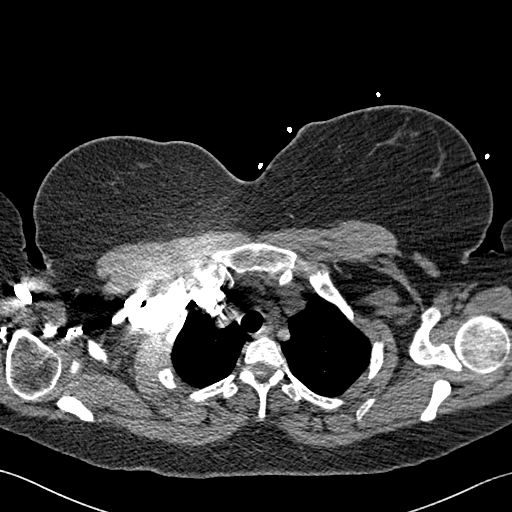
[im 199/229  lung]
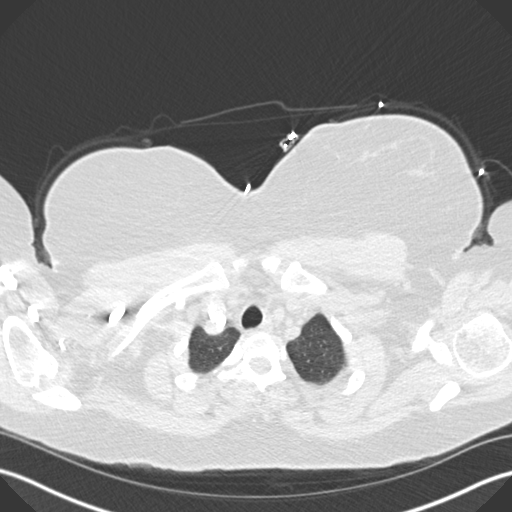
[im 219/229  soft-tissue]
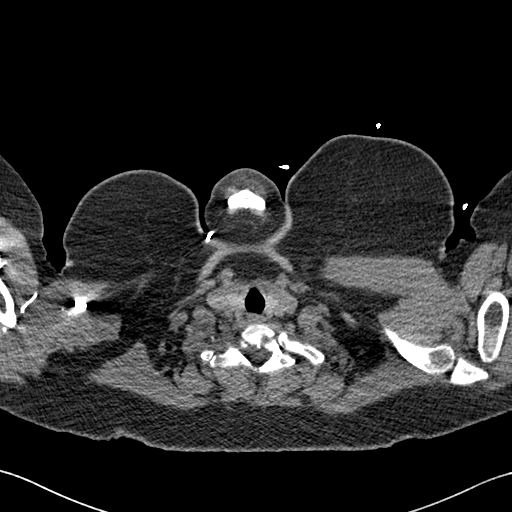

[Series 8: coronal mpr · coronal · 0.47mm/px · 3 of 143 slices shown]
[im 36/143  soft-tissue]
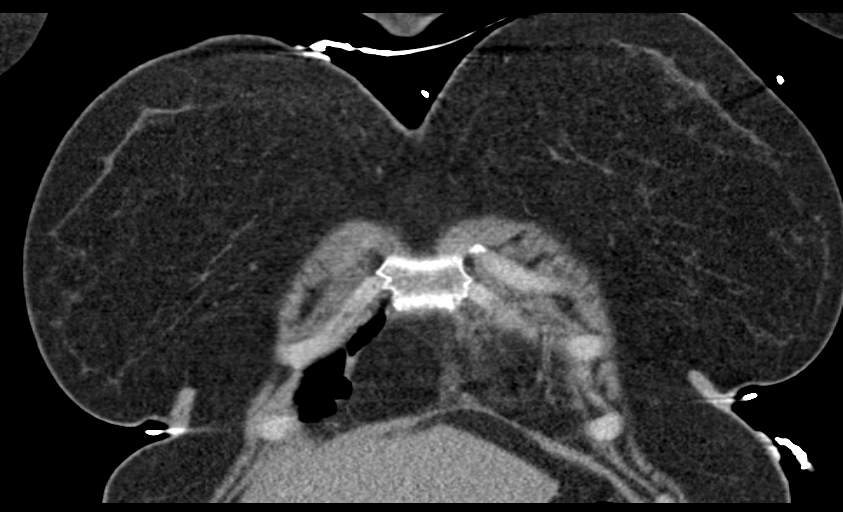
[im 72/143  soft-tissue]
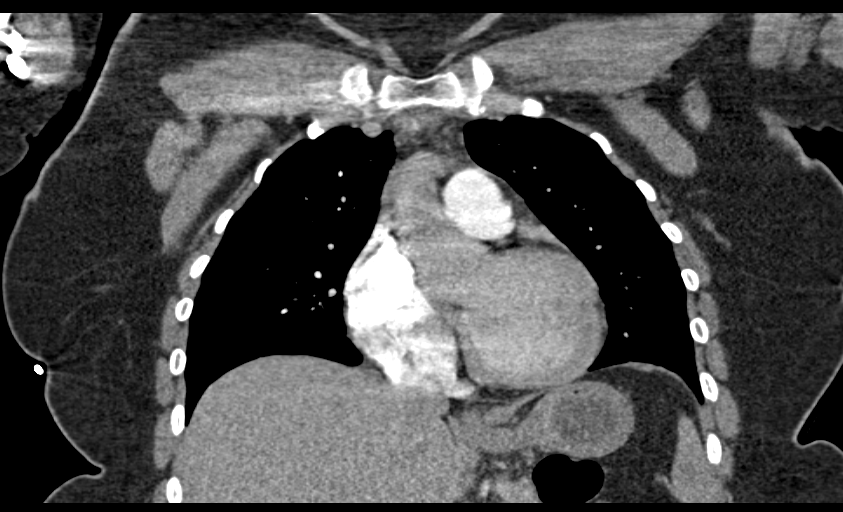
[im 107/143  soft-tissue]
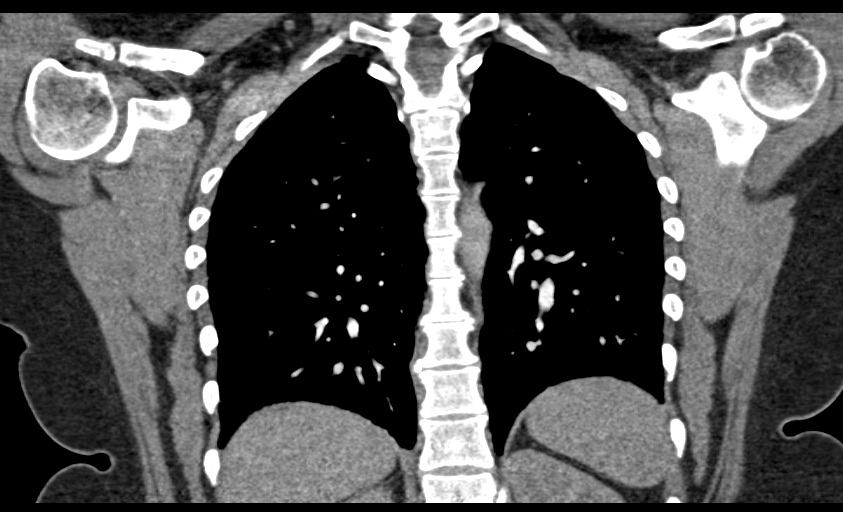

[17 of 46 positions shown; findings below may reference images not displayed]

FINDINGS: Technically adequate study with good opacification of the central
and segmental pulmonary arteries. No focal filling defects are
demonstrated. No evidence of significant pulmonary embolus.

Normal heart size. Normal caliber thoracic aorta. Residual thymic
tissue in the anterior mediastinum. Esophagus is decompressed. No
significant lymphadenopathy in the chest.

Mild dependent changes in the lung bases. No focal airspace disease
or consolidation in the lungs. No pleural effusions. No
pneumothorax.

Included portions of the upper abdominal organs are grossly
unremarkable. No destructive bone lesions.

Review of the MIP images confirms the above findings.
IMPRESSION: No evidence of significant pulmonary embolus. No evidence of active
pulmonary disease.

## 2017-12-13 ENCOUNTER — Encounter (HOSPITAL_COMMUNITY): Payer: Self-pay

## 2017-12-13 ENCOUNTER — Emergency Department (HOSPITAL_COMMUNITY)
Admission: EM | Admit: 2017-12-13 | Discharge: 2017-12-13 | Disposition: A | Discharge status: Home / Self Care | Payer: 59 | Attending: Emergency Medicine | Admitting: Emergency Medicine

## 2017-12-13 ENCOUNTER — Other Ambulatory Visit: Payer: Self-pay

## 2017-12-13 DIAGNOSIS — Z79899 Other long term (current) drug therapy: Secondary | ICD-10-CM | POA: Insufficient documentation

## 2017-12-13 DIAGNOSIS — Y92410 Unspecified street and highway as the place of occurrence of the external cause: Secondary | ICD-10-CM | POA: Insufficient documentation

## 2017-12-13 DIAGNOSIS — M545 Low back pain, unspecified: Secondary | ICD-10-CM

## 2017-12-13 DIAGNOSIS — Y999 Unspecified external cause status: Secondary | ICD-10-CM | POA: Insufficient documentation

## 2017-12-13 DIAGNOSIS — Y9389 Activity, other specified: Secondary | ICD-10-CM | POA: Insufficient documentation

## 2017-12-13 DIAGNOSIS — M542 Cervicalgia: Secondary | ICD-10-CM | POA: Insufficient documentation

## 2017-12-13 MED ORDER — METHOCARBAMOL 500 MG PO TABS: 500.0000 mg | ORAL_TABLET | Freq: Two times a day (BID) | ORAL | 0 refills | Status: DC

## 2017-12-13 MED ORDER — IBUPROFEN 200 MG PO TABS
600.0000 mg | ORAL_TABLET | Freq: Once | ORAL | Status: AC
  Administered 2017-12-13: 600 mg via ORAL
  Filled 2017-12-13: qty 3

## 2017-12-13 MED ORDER — LIDOCAINE 5 % EX PTCH: 1.0000 | MEDICATED_PATCH | CUTANEOUS | 0 refills | Status: DC
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
## 2017-12-13 NOTE — Discharge Instructions (Addendum)
Expect your soreness to increase over the next 2-3 days. Take it easy, but do not lay around too much as this may make any stiffness worse.  Antiinflammatory medications: Take 600 mg of ibuprofen every 6 hours or 440 mg (over the counter dose) to 500 mg (prescription dose) of naproxen every 12 hours for the next 3 days. After this time, these medications may be used as needed for pain. Take these medications with food to avoid upset stomach. Choose only one of these medications, do not take them together. Acetaminophen (generic for Tylenol): Should you continue to have additional pain while taking the ibuprofen or naproxen, you may add in acetaminophen as needed. Your daily total maximum amount of acetaminophen from all sources should be limited to 4000mg /day for persons without liver problems, or 2000mg /day for those with liver problems. Muscle relaxer: Robaxin is a muscle relaxer and may help loosen stiff muscles. Do not take the Robaxin while driving or performing other dangerous activities.  Lidocaine patches: These are available via either prescription or over-the-counter. The over-the-counter option may be more economical one and are likely just as effective. There are multiple over-the-counter brands, such as Salonpas. Exercises: Be sure to perform the attached exercises starting with three times a week and working up to performing them daily. This is an essential part of preventing long term problems.  Follow up: Follow up with a primary care provider for any future management of these complaints. Be sure to follow up within 7-10 days. Return: Return to the ED should symptoms worsen.
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
## 2018-01-18 DIAGNOSIS — N390 Urinary tract infection, site not specified: Secondary | ICD-10-CM | POA: Diagnosis not present

## 2018-05-01 DIAGNOSIS — J069 Acute upper respiratory infection, unspecified: Secondary | ICD-10-CM | POA: Diagnosis not present

## 2018-06-26 ENCOUNTER — Emergency Department (HOSPITAL_BASED_OUTPATIENT_CLINIC_OR_DEPARTMENT_OTHER): Payer: 59

## 2018-06-26 ENCOUNTER — Other Ambulatory Visit: Payer: Self-pay

## 2018-06-26 ENCOUNTER — Encounter (HOSPITAL_BASED_OUTPATIENT_CLINIC_OR_DEPARTMENT_OTHER): Payer: Self-pay

## 2018-06-26 ENCOUNTER — Emergency Department (HOSPITAL_BASED_OUTPATIENT_CLINIC_OR_DEPARTMENT_OTHER)
Admission: EM | Admit: 2018-06-26 | Discharge: 2018-06-26 | Disposition: A | Discharge status: Home / Self Care | Payer: 59 | Attending: Emergency Medicine | Admitting: Emergency Medicine

## 2018-06-26 DIAGNOSIS — M79604 Pain in right leg: Secondary | ICD-10-CM | POA: Insufficient documentation

## 2018-06-26 DIAGNOSIS — R0789 Other chest pain: Secondary | ICD-10-CM | POA: Diagnosis not present

## 2018-06-26 DIAGNOSIS — Z79899 Other long term (current) drug therapy: Secondary | ICD-10-CM | POA: Insufficient documentation

## 2018-06-26 LAB — BASIC METABOLIC PANEL
ANION GAP: 6 (ref 5–15)
BUN: 11 mg/dL (ref 6–20)
CALCIUM: 8.8 mg/dL — AB (ref 8.9–10.3)
CO2: 24 mmol/L (ref 22–32)
Chloride: 106 mmol/L (ref 98–111)
Creatinine, Ser: 0.69 mg/dL (ref 0.44–1.00)
Glucose, Bld: 87 mg/dL (ref 70–99)
Potassium: 3.2 mmol/L — ABNORMAL LOW (ref 3.5–5.1)
Sodium: 136 mmol/L (ref 135–145)

## 2018-06-26 LAB — PREGNANCY, URINE: PREG TEST UR: NEGATIVE

## 2018-06-26 LAB — CBC
HCT: 39.9 % (ref 36.0–46.0)
Hemoglobin: 12.3 g/dL (ref 12.0–15.0)
MCH: 26.2 pg (ref 26.0–34.0)
MCHC: 30.8 g/dL (ref 30.0–36.0)
MCV: 84.9 fL (ref 80.0–100.0)
NRBC: 0 % (ref 0.0–0.2)
PLATELETS: 268 10*3/uL (ref 150–400)
RBC: 4.7 MIL/uL (ref 3.87–5.11)
RDW: 14.1 % (ref 11.5–15.5)
WBC: 8.7 10*3/uL (ref 4.0–10.5)

## 2018-06-26 LAB — TROPONIN I: Troponin I: <0.03 ng/mL (ref <0.03)

## 2018-06-26 MED ORDER — IBUPROFEN 800 MG PO TABS: 800.0000 mg | ORAL_TABLET | Freq: Three times a day (TID) | ORAL | 0 refills | Status: DC | PRN

## 2018-06-26 MED FILL — IBUPROFEN 800 MG TABS: 800 | 7 days supply | Qty: 21 | Fill #0

## 2018-06-26 NOTE — ED Triage Notes (Signed)
C/o CP, right LE pain x 3 days-denies injury-NAD-steady gait

## 2018-06-26 NOTE — ED Provider Notes (Signed)
[facility] EMERGENCY DEPARTMENT Provider Note   CSN: [id number] Arrival date & time: 06/26/18  1334    History   Chief Complaint Chief Complaint  Patient presents with  . Chest Pain    HPI AMYRI FRENZ is a 33 y.o. female.     HPI Patient presents to the emergency department with right leg pain that started Monday.  She states that this time she not having any pain.  Patient states that she is also had some intermittent chest pain mostly with deep breathing.  Patient states she is not having any chest pain at this time.  Patient states she did not take any medications prior to arrival for her symptoms.  The patient deniesshortness of breath, headache,blurred vision, neck pain, fever, cough, weakness, numbness, dizziness, anorexia, edema, abdominal pain, nausea, vomiting, diarrhea, rash, back pain, dysuria, hematemesis, bloody stool, near syncope, or syncope. Past Medical History:  Diagnosis Date  . Bronchitis   . Bronchitis    uses inhaler prn - rarely uses  . Hand laceration    lt  . Headache(784.0)   . Meningitis   . Obesity   . SVD (spontaneous vaginal delivery)    x 1    Patient Active Problem List   Diagnosis Date Noted  . Normal vaginal delivery 12/21/2013  . Decreased fetal movement 12/20/2013  . Decreased fetal movement determined by examination 12/20/2013    Past Surgical History:  Procedure Laterality Date  . DILATION AND EVACUATION N/A 08/16/2012   Procedure: DILATATION AND EVACUATION;  Surgeon: Delice Lesch, MD;  Location: [facility];  Service: Gynecology;  Laterality: N/A;  . left hand surgery     tendon repair  . WOUND EXPLORATION  03/14/2012   Procedure: WOUND EXPLORATION;  Surgeon: Tennis Must, MD;  Location: [facility];  Service: Orthopedics;  Laterality: Left;  Left Hand Repair Flexor Tendon, Possible Nerve/Artery Repair , debridement of tendon     OB History    Gravida  3   Para  2   Term  2   Preterm        AB  1   Living  2     SAB  1   TAB      Ectopic      Multiple      Live Births  2            Home Medications    Prior to Admission medications   Medication Sig Start Date End Date Taking? Authorizing Provider  ibuprofen (ADVIL,MOTRIN) 600 MG tablet Take 1 tablet (600 mg total) by mouth every 6 (six) hours as needed. 09/12/15   Ripley Fraise, MD  levonorgestrel (MIRENA) 20 MCG/24HR IUD 1 each by Intrauterine route continuous.    [provider]  lidocaine (LIDODERM) 5 % Place 1 patch onto the skin daily. Remove & Discard patch within 12 hours or as directed by MD 12/13/17   Arlean Hopping C, PA-C  methocarbamol (ROBAXIN) 500 MG tablet Take 1 tablet (500 mg total) by mouth 2 (two) times daily. 12/13/17   Joy, Shawn C, PA-C  naproxen (NAPROSYN) 500 MG tablet Take 500-1,000 mg by mouth daily as needed for moderate pain.    [provider]    Family History Family History  Problem Relation Age of Onset  . Hearing loss Mother   . Asthma Father     Social History Social History   Tobacco Use  . Smoking status: Never Smoker  .  Smokeless tobacco: Never Used  Substance Use Topics  . Alcohol use: Yes    Comment: occ  . Drug use: No     Allergies   Patient has no known allergies.   Review of Systems Review of Systems  All other systems negative except as documented in the HPI. All pertinent positives and negatives as reviewed in the HPI. Physical Exam Updated Vital Signs BP 120/76 (BP Location: Left Arm)   Pulse 61   Temp 98.9 F (37.2 C) (Oral)   Resp 16   Ht 5\' 3"  (1.6 m)   Wt 118.4 kg   SpO2 100%   BMI 46.23 kg/m   Physical Exam Vitals signs and nursing note reviewed.  Constitutional:      General: She is not in acute distress.    Appearance: She is well-developed.  HENT:     Head: Normocephalic and atraumatic.  Eyes:     Pupils: Pupils are equal, round, and reactive to light.  Neck:     Musculoskeletal: Normal range of  motion and neck supple.  Cardiovascular:     Rate and Rhythm: Normal rate and regular rhythm.     Heart sounds: Normal heart sounds. No murmur. No friction rub. No gallop.   Pulmonary:     Effort: Pulmonary effort is normal. No respiratory distress.     Breath sounds: Normal breath sounds. No decreased breath sounds, wheezing or rhonchi.  Abdominal:     General: Bowel sounds are normal. There is no distension.     Palpations: Abdomen is soft.     Tenderness: There is no abdominal tenderness.  Musculoskeletal:     Right lower leg: She exhibits no tenderness. No edema.     Left lower leg: She exhibits no tenderness. No edema.  Skin:    General: Skin is warm and dry.     Capillary Refill: Capillary refill takes less than 2 seconds.     Findings: No erythema or rash.  Neurological:     Mental Status: She is alert and oriented to person, place, and time.     Motor: No abnormal muscle tone.     Coordination: Coordination normal.  Psychiatric:        Behavior: Behavior normal.      ED Treatments / Results  Labs (all labs ordered are listed, but only abnormal results are displayed) Labs Reviewed  BASIC METABOLIC PANEL - Abnormal; Notable for the following components:      Result Value   Potassium 3.2 (*)    Calcium 8.8 (*)    All other components within normal limits  CBC  TROPONIN I  PREGNANCY, URINE    EKG None  Radiology Dg Chest 2 View  Result Date: 06/26/2018 CLINICAL DATA:  Acute chest pain. EXAM: CHEST - 2 VIEW COMPARISON:  09/11/2015 radiographs FINDINGS: The cardiomediastinal silhouette is unremarkable. There is no evidence of focal airspace disease, pulmonary edema, suspicious pulmonary nodule/mass, pleural effusion, or pneumothorax. No acute bony abnormalities are identified. IMPRESSION: No active cardiopulmonary disease. Electronically Signed   By: Margarette Canada M.D.   On: 06/26/2018 14:39   US Venous Img Lower Right (dvt Study)  Result Date: 06/26/2018 CLINICAL  DATA:  33 year old female with right lower extremity pain and swelling since Monday EXAM: RIGHT LOWER EXTREMITY VENOUS DOPPLER ULTRASOUND TECHNIQUE: Gray-scale sonography with graded compression, as well as color Doppler and duplex ultrasound were performed to evaluate the lower extremity deep venous systems from the level of the common femoral vein and  including the common femoral, femoral, profunda femoral, popliteal and calf veins including the posterior tibial, peroneal and gastrocnemius veins when visible. The superficial great saphenous vein was also interrogated. Spectral Doppler was utilized to evaluate flow at rest and with distal augmentation maneuvers in the common femoral, femoral and popliteal veins. COMPARISON:  None. FINDINGS: Contralateral Common Femoral Vein: Respiratory phasicity is normal and symmetric with the symptomatic side. No evidence of thrombus. Normal compressibility. Common Femoral Vein: No evidence of thrombus. Normal compressibility, respiratory phasicity and response to augmentation. Saphenofemoral Junction: No evidence of thrombus. Normal compressibility and flow on color Doppler imaging. Profunda Femoral Vein: No evidence of thrombus. Normal compressibility and flow on color Doppler imaging. Femoral Vein: No evidence of thrombus. Normal compressibility, respiratory phasicity and response to augmentation. Popliteal Vein: No evidence of thrombus. Normal compressibility, respiratory phasicity and response to augmentation. Calf Veins: No evidence of thrombus. Normal compressibility and flow on color Doppler imaging. Superficial Great Saphenous Vein: No evidence of thrombus. Normal compressibility. Venous Reflux:  None. Other Findings:  None. IMPRESSION: No evidence of deep venous thrombosis. Electronically Signed   By: Jacqulynn Cadet M.D.   On: 06/26/2018 16:33    Procedures Procedures (including critical care time)  Medications Ordered in ED Medications - No data to  display   Initial Impression / Assessment and Plan / ED Course  I have reviewed the triage vital signs and the nursing notes.  Pertinent labs & imaging results that were available during my care of the patient were reviewed by me and considered in my medical decision making (see chart for details).        Be treated for her issues symptomatically.  This time she is not having any current symptoms therefore we will have her follow-up with her primary doctor.  Her vital signs remained normal here in the emergency department and are showing no signs that she is hypoxic or tachycardic.  Patient's DVT study was negative. Final Clinical Impressions(s) / ED Diagnoses   Final diagnoses:  None    ED Discharge Orders    None       Dalia Heading, PA-C 06/26/18 1701    Gareth Morgan, MD 06/28/18 1500

## 2018-06-26 NOTE — Discharge Instructions (Signed)
The DVT study was negative.  The rest of her testing was normal as well.  Follow back up with your primary doctor.

## 2018-07-04 DIAGNOSIS — Z0000 Encounter for general adult medical examination without abnormal findings: Secondary | ICD-10-CM | POA: Diagnosis not present

## 2018-07-04 DIAGNOSIS — K769 Liver disease, unspecified: Secondary | ICD-10-CM | POA: Diagnosis not present

## 2018-07-18 ENCOUNTER — Other Ambulatory Visit: Payer: Self-pay | Admitting: Physician Assistant

## 2018-07-18 DIAGNOSIS — K769 Liver disease, unspecified: Secondary | ICD-10-CM

## 2018-07-24 ENCOUNTER — Other Ambulatory Visit: Payer: Self-pay

## 2018-07-24 ENCOUNTER — Ambulatory Visit
Admission: RE | Admit: 2018-07-24 | Discharge: 2018-07-24 | Disposition: A | Discharge status: Home / Self Care | Payer: Commercial Managed Care - PPO | Source: Ambulatory Visit | Attending: Physician Assistant | Admitting: Physician Assistant

## 2018-07-24 DIAGNOSIS — K769 Liver disease, unspecified: Secondary | ICD-10-CM

## 2018-07-24 MED ORDER — GADOBENATE DIMEGLUMINE 529 MG/ML IV SOLN
20.0000 mL | Freq: Once | INTRAVENOUS | Status: AC | PRN
  Administered 2018-07-24: 15:00:00 20 mL via INTRAVENOUS

## 2018-07-26 DIAGNOSIS — D134 Benign neoplasm of liver: Secondary | ICD-10-CM | POA: Diagnosis not present

## 2018-09-19 ENCOUNTER — Other Ambulatory Visit: Payer: Self-pay | Admitting: Physician Assistant

## 2018-09-19 DIAGNOSIS — D134 Benign neoplasm of liver: Secondary | ICD-10-CM

## 2019-03-31 ENCOUNTER — Emergency Department (HOSPITAL_COMMUNITY): Payer: Commercial Managed Care - PPO

## 2019-03-31 ENCOUNTER — Other Ambulatory Visit: Payer: Self-pay

## 2019-03-31 ENCOUNTER — Encounter (HOSPITAL_COMMUNITY): Payer: Self-pay | Admitting: *Deleted

## 2019-03-31 ENCOUNTER — Emergency Department (HOSPITAL_COMMUNITY)
Admission: EM | Admit: 2019-03-31 | Discharge: 2019-03-31 | Disposition: A | Discharge status: Home / Self Care | Payer: Commercial Managed Care - PPO | Attending: Emergency Medicine | Admitting: Emergency Medicine

## 2019-03-31 DIAGNOSIS — R091 Pleurisy: Secondary | ICD-10-CM | POA: Diagnosis not present

## 2019-03-31 DIAGNOSIS — Z79899 Other long term (current) drug therapy: Secondary | ICD-10-CM | POA: Insufficient documentation

## 2019-03-31 DIAGNOSIS — R079 Chest pain, unspecified: Secondary | ICD-10-CM | POA: Diagnosis present

## 2019-03-31 LAB — BASIC METABOLIC PANEL
Anion gap: 9 (ref 5–15)
BUN: 10 mg/dL (ref 6–20)
CO2: 22 mmol/L (ref 22–32)
Calcium: 9 mg/dL (ref 8.9–10.3)
Chloride: 107 mmol/L (ref 98–111)
Creatinine, Ser: 0.78 mg/dL (ref 0.44–1.00)
GFR calc Af Amer: >60 mL/min (ref >60)
GFR calc non Af Amer: >60 mL/min (ref >60)
Glucose, Bld: 116 mg/dL — ABNORMAL HIGH (ref 70–99)
Potassium: 3.3 mmol/L — ABNORMAL LOW (ref 3.5–5.1)
Sodium: 138 mmol/L (ref 135–145)

## 2019-03-31 LAB — CBC
HCT: 42.2 % (ref 36.0–46.0)
Hemoglobin: 13.2 g/dL (ref 12.0–15.0)
MCH: 26.2 pg (ref 26.0–34.0)
MCHC: 31.3 g/dL (ref 30.0–36.0)
MCV: 83.9 fL (ref 80.0–100.0)
Platelets: 284 10*3/uL (ref 150–400)
RBC: 5.03 MIL/uL (ref 3.87–5.11)
RDW: 13.5 % (ref 11.5–15.5)
WBC: 9.9 10*3/uL (ref 4.0–10.5)
nRBC: 0 % (ref 0.0–0.2)

## 2019-03-31 LAB — TROPONIN I (HIGH SENSITIVITY)
Troponin I (High Sensitivity): 3 ng/L (ref <18)
Troponin I (High Sensitivity): <2 ng/L (ref <18)

## 2019-03-31 LAB — D-DIMER, QUANTITATIVE: D-Dimer, Quant: 0.8 ug/mL-FEU — ABNORMAL HIGH (ref 0.00–0.50)

## 2019-03-31 LAB — I-STAT BETA HCG BLOOD, ED (MC, WL, AP ONLY): I-stat hCG, quantitative: <5 m[IU]/mL (ref <5)

## 2019-03-31 MED ORDER — IOHEXOL 350 MG/ML SOLN
100.0000 mL | Freq: Once | INTRAVENOUS | Status: AC | PRN
  Administered 2019-03-31: 70 mL via INTRAVENOUS

## 2019-03-31 MED ORDER — SODIUM CHLORIDE 0.9% FLUSH
3.0000 mL | Freq: Once | INTRAVENOUS | Status: AC
  Administered 2019-03-31: 3 mL via INTRAVENOUS

## 2019-03-31 MED ORDER — KETOROLAC TROMETHAMINE 30 MG/ML IJ SOLN
30.0000 mg | Freq: Once | INTRAMUSCULAR | Status: AC
  Administered 2019-03-31: 30 mg via INTRAVENOUS
  Filled 2019-03-31: qty 1

## 2019-03-31 NOTE — ED Provider Notes (Signed)
[facility] EMERGENCY DEPARTMENT Provider Note   CSN: [id number] Arrival date & time: 03/31/19  0415     History   Chief Complaint Chief Complaint  Patient presents with  . Chest Pain    HPI Tara Bender is a 33 y.o. female.     Patient is a 33 year old female with no significant past medical history who presents with chest pain.  She states she has had right-sided chest pain since about 7:00 last night.  It is constant.  It is worse with deep inspiration.  It is a sharp pain that radiates to her upper back.  She denies any leg pain or swelling.  She uses an IUD but no oral contraceptives.  She has some minor shortness of breath.  No cough or cold symptoms.  No known fevers.  No history of DVT in the past.  No exertional symptoms.  It does not seem to be worse with movement.     Past Medical History:  Diagnosis Date  . Bronchitis   . Bronchitis    uses inhaler prn - rarely uses  . Hand laceration    lt  . Headache(784.0)   . Meningitis   . Obesity   . SVD (spontaneous vaginal delivery)    x 1    Patient Active Problem List   Diagnosis Date Noted  . Normal vaginal delivery 12/21/2013  . Decreased fetal movement 12/20/2013  . Decreased fetal movement determined by examination 12/20/2013    Past Surgical History:  Procedure Laterality Date  . DILATION AND EVACUATION N/A 08/16/2012   Procedure: DILATATION AND EVACUATION;  Surgeon: Delice Lesch, MD;  Location: [facility];  Service: Gynecology;  Laterality: N/A;  . left hand surgery     tendon repair  . WOUND EXPLORATION  03/14/2012   Procedure: WOUND EXPLORATION;  Surgeon: Tennis Must, MD;  Location: [facility];  Service: Orthopedics;  Laterality: Left;  Left Hand Repair Flexor Tendon, Possible Nerve/Artery Repair , debridement of tendon     OB History    Gravida  3   Para  2   Term  2   Preterm      AB  1   Living  2     SAB  1   TAB      Ectopic      Multiple      Live Births  2            Home Medications    Prior to Admission medications   Medication Sig Start Date End Date Taking? Authorizing Provider  ibuprofen (ADVIL,MOTRIN) 800 MG tablet Take 1 tablet (800 mg total) by mouth every 8 (eight) hours as needed. 06/26/18   Lawyer, Harrell Gave, PA-C  levonorgestrel (MIRENA) 20 MCG/24HR IUD 1 each by Intrauterine route continuous.    [provider]  lidocaine (LIDODERM) 5 % Place 1 patch onto the skin daily. Remove & Discard patch within 12 hours or as directed by MD 12/13/17   Arlean Hopping C, PA-C  methocarbamol (ROBAXIN) 500 MG tablet Take 1 tablet (500 mg total) by mouth 2 (two) times daily. 12/13/17   Joy, Shawn C, PA-C  naproxen (NAPROSYN) 500 MG tablet Take 500-1,000 mg by mouth daily as needed for moderate pain.    [provider]    Family History Family History  Problem Relation Age of Onset  . Hearing loss Mother   . Asthma Father     Social History Social History  Tobacco Use  . Smoking status: Never Smoker  . Smokeless tobacco: Never Used  Substance Use Topics  . Alcohol use: Yes    Comment: occ  . Drug use: No     Allergies   Patient has no known allergies.   Review of Systems Review of Systems  Constitutional: Negative for chills, diaphoresis, fatigue and fever.  HENT: Negative for congestion, rhinorrhea and sneezing.   Eyes: Negative.   Respiratory: Positive for shortness of breath. Negative for cough and chest tightness.   Cardiovascular: Positive for chest pain. Negative for leg swelling.  Gastrointestinal: Negative for abdominal pain, blood in stool, diarrhea, nausea and vomiting.  Genitourinary: Negative for difficulty urinating, flank pain, frequency and hematuria.  Musculoskeletal: Negative for arthralgias and back pain.  Skin: Negative for rash.  Neurological: Negative for dizziness, speech difficulty, weakness, numbness and headaches.     Physical Exam Updated Vital  Signs BP 111/85 (BP Location: Right Arm)   Pulse 80   Temp 98.8 F (37.1 C) (Oral)   Resp 18   Wt 118.4 kg   SpO2 99%   BMI 46.24 kg/m   Physical Exam Constitutional:      Appearance: She is well-developed. She is obese.  HENT:     Head: Normocephalic and atraumatic.  Eyes:     Pupils: Pupils are equal, round, and reactive to light.  Neck:     Musculoskeletal: Normal range of motion and neck supple.  Cardiovascular:     Rate and Rhythm: Normal rate and regular rhythm.     Heart sounds: Normal heart sounds.  Pulmonary:     Effort: Pulmonary effort is normal. No respiratory distress.     Breath sounds: Normal breath sounds. No wheezing or rales.  Chest:     Chest wall: No tenderness.  Abdominal:     General: Bowel sounds are normal.     Palpations: Abdomen is soft.     Tenderness: There is no abdominal tenderness. There is no guarding or rebound.  Musculoskeletal: Normal range of motion.     Right lower leg: She exhibits no tenderness.     Left lower leg: She exhibits no tenderness.     Comments: No pitting edema or obvious unilateral leg swelling, no calf tenderness  Lymphadenopathy:     Cervical: No cervical adenopathy.  Skin:    General: Skin is warm and dry.     Findings: No rash.  Neurological:     Mental Status: She is alert and oriented to person, place, and time.      ED Treatments / Results  Labs (all labs ordered are listed, but only abnormal results are displayed) Labs Reviewed  BASIC METABOLIC PANEL - Abnormal; Notable for the following components:      Result Value   Potassium 3.3 (*)    Glucose, Bld 116 (*)    All other components within normal limits  D-DIMER, QUANTITATIVE (NOT AT [facility]) - Abnormal; Notable for the following components:   D-Dimer, Quant 0.80 (*)    All other components within normal limits  CBC  I-STAT BETA HCG BLOOD, ED ([facility], [facility], [facility] ONLY)  TROPONIN I (HIGH SENSITIVITY)  TROPONIN I (HIGH SENSITIVITY)    EKG EKG  Interpretation  Date/Time:  Monday March 31 2019 09:00:23 EST Ventricular Rate:  82 PR Interval:  128 QRS Duration: 86 QT Interval:  372 QTC Calculation: 435 R Axis:   36 Text Interpretation: Sinus rhythm Short PR interval Borderline T abnormalities, diffuse leads since last  tracing no significant change Confirmed by Malvin Johns [id number]) on 03/31/2019 9:43:05 AM   Radiology Dg Chest 2 View  Result Date: 03/31/2019 CLINICAL DATA:  Right-sided chest pain, pain inspiration. EXAM: CHEST - 2 VIEW COMPARISON:  Radiograph 06/26/2018. CT 11/24/2015 FINDINGS: The cardiomediastinal contours are normal. Streaky bibasilar opacities, right greater than left. Possible trace right pleural effusion. Pulmonary vasculature is normal. No pneumothorax. No acute osseous abnormalities are seen. IMPRESSION: Streaky bibasilar opacities, right greater than left, favoring atelectasis. Possible small right pleural effusion. Electronically Signed   By: Keith Rake M.D.   On: 03/31/2019 05:05   Ct Angio Chest Pe W/cm &/or Wo Cm  Result Date: 03/31/2019 CLINICAL DATA:  PE suspected EXAM: CT ANGIOGRAPHY CHEST WITH CONTRAST TECHNIQUE: Multidetector CT imaging of the chest was performed using the standard protocol during bolus administration of intravenous contrast. Multiplanar CT image reconstructions and MIPs were obtained to evaluate the vascular anatomy. CONTRAST:  33mL OMNIPAQUE IOHEXOL 350 MG/ML SOLN COMPARISON:  Same day chest radiograph, CT chest angiogram, 09/12/2015 FINDINGS: Cardiovascular: Satisfactory opacification of the pulmonary arteries to the segmental level. No evidence of pulmonary embolism. Cardiomegaly. No pericardial effusion. Mediastinum/Nodes: No enlarged mediastinal, hilar, or axillary lymph nodes. Thymic remnant in the anterior mediastinum. Thyroid gland, trachea, and esophagus demonstrate no significant findings. Lungs/Pleura: Bibasilar atelectasis or consolidation, right greater than left. No  pleural effusion or pneumothorax. Upper Abdomen: No acute abnormality. Musculoskeletal: No chest wall abnormality. No acute or significant osseous findings. Review of the MIP images confirms the above findings. IMPRESSION: 1.  Negative examination for pulmonary embolism. 2.  Bibasilar atelectasis or consolidation, right greater than left. 3.  Cardiomegaly. Electronically Signed   By: Eddie Candle M.D.   On: 03/31/2019 09:45    Procedures Procedures (including critical care time)  Medications Ordered in ED Medications  sodium chloride flush (NS) 0.9 % injection 3 mL (3 mLs Intravenous Given 03/31/19 1057)  iohexol (OMNIPAQUE) 350 MG/ML injection 100 mL (70 mLs Intravenous Contrast Given 03/31/19 0911)  ketorolac (TORADOL) 30 MG/ML injection 30 mg (30 mg Intravenous Given 03/31/19 1057)     Initial Impression / Assessment and Plan / ED Course  I have reviewed the triage vital signs and the nursing notes.  Pertinent labs & imaging results that were available during my care of the patient were reviewed by me and considered in my medical decision making (see chart for details).        Patient is a 33 year old female who presents with pleuritic right-sided chest pain.  Her EKG does not show any ischemic changes.  She is had 2 - troponins.  Her symptoms do not sound cardiac in nature.  She has had no arrhythmias noted.  No evidence of pneumonia or pneumothorax.  Her D-dimer was positive.  His CT chest was performed which was negative for pulmonary embolus.  This is likely pleurisy.  She was given instructions on symptomatic care.  She was given incentive spirometer for the atelectasis noted on x-ray.  She has had a history of this in the past and currently is taking more shallow breaths due to the pleuritic pain.  She was advised on using anti-inflammatory medications.  Return precautions were given.  Final Clinical Impressions(s) / ED Diagnoses   Final diagnoses:  Pleurisy    ED Discharge  Orders    None       Malvin Johns, MD 03/31/19 1058

## 2019-03-31 NOTE — ED Triage Notes (Signed)
Rt sided chest pain since 1900  Pain with inspiration  lmp  Has a iud

## 2019-04-09 ENCOUNTER — Encounter: Payer: Self-pay | Admitting: Cardiology

## 2019-04-09 ENCOUNTER — Ambulatory Visit (INDEPENDENT_AMBULATORY_CARE_PROVIDER_SITE_OTHER): Payer: Commercial Managed Care - PPO | Admitting: Cardiology

## 2019-04-09 ENCOUNTER — Other Ambulatory Visit: Payer: Self-pay

## 2019-04-09 VITALS — BP 122/68 | HR 69 | Ht 64.0 in | Wt 268.8 lb

## 2019-04-09 DIAGNOSIS — R079 Chest pain, unspecified: Secondary | ICD-10-CM

## 2019-04-09 DIAGNOSIS — R0602 Shortness of breath: Secondary | ICD-10-CM

## 2019-04-09 DIAGNOSIS — I517 Cardiomegaly: Secondary | ICD-10-CM

## 2019-04-09 HISTORY — DX: Chest pain, unspecified: R07.9

## 2019-04-09 HISTORY — DX: Shortness of breath: R06.02

## 2019-04-09 NOTE — Progress Notes (Signed)
Primary Care Provider: Default, Provider, MD Cardiologist: No primary care provider on file. Electrophysiologist:   Clinic Note: Chief Complaint  Patient presents with  . New Patient (Initial Visit)    Chest pain, abnormal CT  . Heart Problem    Cardiomegaly on CT   HPI:    Tara Bender is a 33 y.o. female who is being seen today for the evaluation of CARDIOMEGALY on CT at the request of Livengood, Jessica J, P* - in response to recent ER visit for CP - thought to be Pleurisy.  CTA - no PE, Bilateral atelectasis - cardiomegaly.   Tara Bender was evaluated in the ER on April 05, 2019  Recent Hospitalizations:   ER April 05, 2019: Presented with chest pain that was right-sided began around 7:00 the previous night.  Was constant.  Worse with deep inspiration.  Sharp and radiated to the upper back. -->  Was found to have atelectasis on CT scan, symptoms felt to be pleurisy.   Reviewed  CV studies:    The following studies were reviewed today: (if available, images/films reviewed: From Epic Chart or Care Everywhere) . CTA Chest 03/31/2019: No PE.  Bibasilar atelectasis or consolidation R>L.  Cardiomegaly   Interval History:   Tara Bender is a very pleasant, morbidly obese woman presents here today for evaluation.  She states that she still has the chest discomfort off and on.  If not ongoing at this present time.  It is kind of off and on now but the episode that occurred leading to the ER visit was a prolonged spell the cocaine went.  It was sharp going from the right to the left.  Off also felt somewhat squeezing.  It occurred at rest not necessarily with exertion.  There was no associated shortness of breath.  She had similar type discomfort about 2 or 3 years ago was found to have atelectasis at that time as well.   CV Review of Symptoms (Summary): positive for - chest pain and Shortness of breath if she exerts herself significantly. negative for -  edema, irregular heartbeat, orthopnea, palpitations, paroxysmal nocturnal dyspnea, rapid heart rate, shortness of breath or Syncope/near syncope, TIA/amaurosis fugax, claudication.  The patient does not have symptoms concerning for COVID-19 infection (fever, chills, cough, or new shortness of breath).  The patient is practicing social distancing. + Masking.  ++ Groceries/shopping. ++  Work is able to do social distancing.   REVIEWED OF SYSTEMS   A comprehensive ROS was performed. Review of Systems  Constitutional: Negative for malaise/fatigue and weight loss.  HENT: Negative for congestion and nosebleeds.   Respiratory: Positive for cough (Not much). Negative for shortness of breath.   Gastrointestinal: Negative for blood in stool and melena.  Genitourinary: Negative for hematuria.  Musculoskeletal: Negative for falls.  Neurological: Negative for dizziness and headaches.  Psychiatric/Behavioral: Negative for depression and memory loss. The patient is not nervous/anxious and does not have insomnia.    I have reviewed and (if needed) personally updated the patient's problem list, medications, allergies, past medical and surgical history, social and family history.   PAST MEDICAL HISTORY   Past Medical History:  Diagnosis Date  . Bronchitis   . Bronchitis    uses inhaler prn - rarely uses  . Hand laceration    lt  . Headache(784.0)   . Meningitis   . Obesity   . SVD (spontaneous vaginal delivery)    x 1     PAST SURGICAL  HISTORY   Past Surgical History:  Procedure Laterality Date  . DILATION AND EVACUATION N/A 08/16/2012   Procedure: DILATATION AND EVACUATION;  Surgeon: Delice Lesch, MD;  Location: [facility];  Service: Gynecology;  Laterality: N/A;  . left hand surgery     tendon repair  . WOUND EXPLORATION  03/14/2012   Procedure: WOUND EXPLORATION;  Surgeon: Tennis Must, MD;  Location: [facility];  Service: Orthopedics;  Laterality: Left;  Left Hand  Repair Flexor Tendon, Possible Nerve/Artery Repair , debridement of tendon     MEDICATIONS/ALLERGIES   Current Meds  Medication Sig  . levonorgestrel (MIRENA) 20 MCG/24HR IUD 1 each by Intrauterine route continuous.  . naproxen (NAPROSYN) 500 MG tablet Take 500-1,000 mg by mouth daily as needed for moderate pain.    No Known Allergies   SOCIAL HISTORY/FAMILY HISTORY   Social History   Tobacco Use  . Smoking status: Never Smoker  . Smokeless tobacco: Never Used  Substance Use Topics  . Alcohol use: Yes    Comment: occ  . Drug use: No   Social History   Social History Narrative  . Not on file    Family History family history includes Asthma in her father; Hearing loss in her mother.   OBJCTIVE -PE, EKG, labs   Wt Readings from Last 3 Encounters:  04/09/19 268 lb 12.8 oz (121.9 kg)  03/31/19 261 lb 0.4 oz (118.4 kg)  06/26/18 261 lb (118.4 kg)    Physical Exam: BP 122/68   Pulse 69   Ht 5\' 4"  (1.626 m)   Wt 268 lb 12.8 oz (121.9 kg)   BMI 46.14 kg/m  Physical Exam  Constitutional: She is oriented to person, place, and time. She appears well-developed and well-nourished. No distress.  Morbid obese  HENT:  Head: Normocephalic and atraumatic.  Eyes: Pupils are equal, round, and reactive to light. Conjunctivae and EOM are normal.  Neck: No JVD present. Carotid bruit is not present. No tracheal deviation present. No thyromegaly present.  Cardiovascular: Normal rate, regular rhythm, S1 normal, S2 normal and intact distal pulses.  No extrasystoles are present. PMI is not displaced (Unable to palpate). Exam reveals distant heart sounds. Exam reveals no gallop and no friction rub.  No murmur heard. Pulmonary/Chest: Effort normal and breath sounds normal. No respiratory distress. She has no wheezes. She has no rales.  Abdominal: Soft. Bowel sounds are normal. She exhibits no distension. There is no abdominal tenderness. There is no rebound.  Musculoskeletal:         General: No edema. Normal range of motion.     Cervical back: Normal range of motion and neck supple.  Neurological: She is alert and oriented to person, place, and time. No cranial nerve deficit.  Psychiatric: She has a normal mood and affect. Her behavior is normal. Judgment and thought content normal.  Vitals reviewed.   Adult ECG Report  Rate: 69 ;  Rhythm: normal sinus rhythm and LVH.  Nonspecific ST and T wave changes, likely repolarization changes.;   Narrative Interpretation: Relatively normal EKG  Recent Labs: Has not had lipid check in years. Lab Results  Component Value Date   CHOL 144 11/29/2007   HDL 52 11/29/2007   LDLCALC 84 11/29/2007   TRIG 38 11/29/2007   CHOLHDL 2.8 Ratio 11/29/2007   Lab Results  Component Value Date   CREATININE 0.78 03/31/2019   BUN 10 03/31/2019   NA 138 03/31/2019   K 3.3 (L) 03/31/2019  CL 107 03/31/2019   CO2 22 03/31/2019    ASSESSMENT/PLAN    Problem List Items Addressed This Visit    Cardiomegaly - Primary    Most likely this is a normal call from chest CT.  There is no suggestion of pericardial effusion.  Just the suggestion of cardiomegaly.  Probably projected with body habitus. We will evaluate with a 2D echocardiogram as she has relatively distant heart sounds.  EKG also suggest possible LVH.      Relevant Orders   EKG 12-Lead (Completed)   ECHOCARDIOGRAM COMPLETE   Chest pain of uncertain etiology    Her chest pain seems to be quite musculoskeletal in nature.  Not able to really reproduce it right now, but where she describes it in the fact there is worse with inspiration would suggest that it is not cardiac in nature. There was suggestion that it could be pleurisy and the treatment for that is NSAIDs.  She is reluctant to take NSAIDs.  Not really bothering her that much now.      Relevant Orders   EKG 12-Lead (Completed)   ECHOCARDIOGRAM COMPLETE   Shortness of breath    Truthfully suspect this is more related to  obesity and deconditioning, but with suggestion of cardiomegaly by CT scan and LVH on EKG,, will evaluate with 2D echocardiogram.      Relevant Orders   EKG 12-Lead (Completed)   ECHOCARDIOGRAM COMPLETE       COVID-19 Education: The signs and symptoms of COVID-19 were discussed with the patient and how to seek care for testing (follow up with PCP or arrange E-visit).   The importance of social distancing was discussed today.  I spent a total of 39minutes with the patient and chart review. >  50% of the time was spent in direct patient consultation.  Additional time spent with chart review (studies, outside notes, etc): 10 Total Time: 71min   Current medicines are reviewed at length with the patient today.  (+/- concerns) n/a   Patient Instructions / Medication Changes & Studies & Tests Ordered   Patient Instructions  Medication Instructions:  NO CHANGES *If you need a refill on your cardiac medications before your next appointment, please call your pharmacy*  Lab Work:  NOT NEEDED  Testing/Procedures: WILL BE SCHEDULE AT [place] [facility] Your physician has requested that you have an echocardiogram. Echocardiography is a painless test that uses sound waves to create images of your heart. It provides your doctor with information about the size and shape of your heart and how well your heart's chambers and valves are working. This procedure takes approximately one hour. There are no restrictions for this procedure.   Follow-Up: At [facility], you and your health needs are our priority.  As part of our continuing mission to provide you with exceptional heart care, we have created designated Provider Care Teams.  These Care Teams include your primary Cardiologist (physician) and Advanced Practice Providers (APPs -  Physician Assistants and Nurse Practitioners) who all work together to provide you with the care you need, when you need it.  Your next  appointment:   6 month(s)  The format for your next appointment:   In Person  Provider:   Glenetta Hew, MD    Studies Ordered:   Orders Placed This Encounter  Procedures  . EKG 12-Lead  . ECHOCARDIOGRAM COMPLETE     Glenetta Hew, M.D., M.S. Interventional Cardiologist   Pager # [phone] Phone # [phone]  [address] [place], [place] [address]   Thank you for choosing Heart[facility] at [facility]!!

## 2019-04-09 NOTE — Patient Instructions (Signed)
Medication Instructions:  NO CHANGES *If you need a refill on your cardiac medications before your next appointment, please call your pharmacy*  Lab Work:  NOT NEEDED  Testing/Procedures: WILL BE SCHEDULE AT [place] [facility] Your physician has requested that you have an echocardiogram. Echocardiography is a painless test that uses sound waves to create images of your heart. It provides your doctor with information about the size and shape of your heart and how well your heart's chambers and valves are working. This procedure takes approximately one hour. There are no restrictions for this procedure.   Follow-Up: At [facility], you and your health needs are our priority.  As part of our continuing mission to provide you with exceptional heart care, we have created designated Provider Care Teams.  These Care Teams include your primary Cardiologist (physician) and Advanced Practice Providers (APPs -  Physician Assistants and Nurse Practitioners) who all work together to provide you with the care you need, when you need it.  Your next appointment:   6 month(s)  The format for your next appointment:   In Person  Provider:   Glenetta Hew, MD

## 2019-04-11 ENCOUNTER — Encounter: Payer: Self-pay | Admitting: Cardiology

## 2019-04-11 NOTE — Assessment & Plan Note (Signed)
Most likely this is a normal call from chest CT.  There is no suggestion of pericardial effusion.  Just the suggestion of cardiomegaly.  Probably projected with body habitus. We will evaluate with a 2D echocardiogram as she has relatively distant heart sounds.  EKG also suggest possible LVH.

## 2019-04-11 NOTE — Assessment & Plan Note (Signed)
Her chest pain seems to be quite musculoskeletal in nature.  Not able to really reproduce it right now, but where she describes it in the fact there is worse with inspiration would suggest that it is not cardiac in nature. There was suggestion that it could be pleurisy and the treatment for that is NSAIDs.  She is reluctant to take NSAIDs.  Not really bothering her that much now.

## 2019-04-11 NOTE — Assessment & Plan Note (Signed)
Truthfully suspect this is more related to obesity and deconditioning, but with suggestion of cardiomegaly by CT scan and LVH on EKG,, will evaluate with 2D echocardiogram.

## 2019-04-14 ENCOUNTER — Telehealth: Payer: Self-pay | Admitting: Cardiology

## 2019-04-14 ENCOUNTER — Other Ambulatory Visit: Payer: Self-pay

## 2019-04-14 ENCOUNTER — Encounter (HOSPITAL_COMMUNITY): Payer: Self-pay | Admitting: Emergency Medicine

## 2019-04-14 ENCOUNTER — Emergency Department (HOSPITAL_COMMUNITY)
Admission: EM | Admit: 2019-04-14 | Discharge: 2019-04-14 | Disposition: A | Discharge status: Home / Self Care | Payer: Commercial Managed Care - PPO | Attending: Emergency Medicine | Admitting: Emergency Medicine

## 2019-04-14 ENCOUNTER — Emergency Department (HOSPITAL_COMMUNITY): Payer: Commercial Managed Care - PPO

## 2019-04-14 DIAGNOSIS — R0789 Other chest pain: Secondary | ICD-10-CM | POA: Diagnosis present

## 2019-04-14 DIAGNOSIS — R0602 Shortness of breath: Secondary | ICD-10-CM | POA: Diagnosis not present

## 2019-04-14 LAB — CBC
HCT: 40.6 % (ref 36.0–46.0)
Hemoglobin: 12.6 g/dL (ref 12.0–15.0)
MCH: 26.5 pg (ref 26.0–34.0)
MCHC: 31 g/dL (ref 30.0–36.0)
MCV: 85.5 fL (ref 80.0–100.0)
Platelets: 273 10*3/uL (ref 150–400)
RBC: 4.75 MIL/uL (ref 3.87–5.11)
RDW: 13.7 % (ref 11.5–15.5)
WBC: 6.7 10*3/uL (ref 4.0–10.5)
nRBC: 0 % (ref 0.0–0.2)

## 2019-04-14 LAB — BASIC METABOLIC PANEL
Anion gap: 8 (ref 5–15)
BUN: 7 mg/dL (ref 6–20)
CO2: 25 mmol/L (ref 22–32)
Calcium: 9.1 mg/dL (ref 8.9–10.3)
Chloride: 106 mmol/L (ref 98–111)
Creatinine, Ser: 0.72 mg/dL (ref 0.44–1.00)
GFR calc Af Amer: >60 mL/min (ref >60)
GFR calc non Af Amer: >60 mL/min (ref >60)
Glucose, Bld: 105 mg/dL — ABNORMAL HIGH (ref 70–99)
Potassium: 3.5 mmol/L (ref 3.5–5.1)
Sodium: 139 mmol/L (ref 135–145)

## 2019-04-14 LAB — TROPONIN I (HIGH SENSITIVITY)
Troponin I (High Sensitivity): <2 ng/L (ref <18)
Troponin I (High Sensitivity): 3 ng/L (ref <18)

## 2019-04-14 LAB — I-STAT BETA HCG BLOOD, ED (NOT ORDERABLE): I-stat hCG, quantitative: <5 m[IU]/mL (ref <5)

## 2019-04-14 LAB — D-DIMER, QUANTITATIVE: D-Dimer, Quant: 0.56 ug/mL-FEU — ABNORMAL HIGH (ref 0.00–0.50)

## 2019-04-14 NOTE — Telephone Encounter (Signed)
Pt called in and states that she has stabbing chest pain "every few minutes" since yesterday, she states that it "comes and goes". She describes this as stabbing, squeezing om the left  Side of her chest. She states that this is not like last time when she went to the ER previously(dx was atelectasis), she states that she is not having any pain when she is breathing and states that she did find it hard to breathe and had to sleep sitting up and did not get much sleep d/t the chest pain and difficulty breathing. She will go directly to the ER to be evaluated. She will have someone drive her.

## 2019-04-14 NOTE — Discharge Instructions (Signed)
Your testing today shows no evidence of heart attack. The Screening test for blood clots was slightly elevated but you declined getting another CT scan today.  Your CT 2 weeks ago showed no blood clot.  You understand that there could be a small chance we are missing a small blood clot without doing the CT scan.  Return to the ED if your chest pain, exertional, associated shortness of breath, nausea, vomiting, sweating, any concerns.

## 2019-04-14 NOTE — ED Provider Notes (Signed)
[facility] DEPT Provider Note   CSN: [id number] Arrival date & time: 04/14/19  [id number]     History Chief Complaint  Patient presents with  . Chest Pain    Tara Bender is a 33 y.o. female.  Patient presents with intermittent left-sided chest pain since last night.  She describes pain in the left side of her chest that feels like a pressure or a sharp pain that lasts for just a few seconds at a time and then resolves.  Occasionally radiates to her left arm.  It is not exertional or pleuritic.  There is no associated nausea, diaphoresis, cough or fever.  Does feel mildly short of breath.  She was seen for right-sided chest pain 2 weeks ago and diagnosed with pleurisy.  Pain she is having today is not similar.  She had a negative CTA at that time.  She does take birth control.  She was referred to cardiology due to cardiomegaly on her CT scan and saw them this week.  She has been scheduled for an echocardiogram in 3 days. She called the cardiologist office today and was referred to the ED.  She denied having any chest pain currently.  She has had intermittently since she has been waiting.   The history is provided by the patient.  Chest Pain Associated symptoms: shortness of breath   Associated symptoms: no abdominal pain, no cough, no dizziness, no fatigue, no fever, no headache, no nausea, no vomiting and no weakness        Past Medical History:  Diagnosis Date  . Bronchitis   . Bronchitis    uses inhaler prn - rarely uses  . Hand laceration    lt  . Headache(784.0)   . Meningitis   . Obesity   . SVD (spontaneous vaginal delivery)    x 1    Patient Active Problem List   Diagnosis Date Noted  . Cardiomegaly 04/09/2019  . Chest pain of uncertain etiology 123XX123  . Shortness of breath 04/09/2019  . Normal vaginal delivery 12/21/2013  . Decreased fetal movement 12/20/2013  . Decreased fetal movement determined by examination 12/20/2013     Past Surgical History:  Procedure Laterality Date  . DILATION AND EVACUATION N/A 08/16/2012   Procedure: DILATATION AND EVACUATION;  Surgeon: Delice Lesch, MD;  Location: [facility];  Service: Gynecology;  Laterality: N/A;  . left hand surgery     tendon repair  . WOUND EXPLORATION  03/14/2012   Procedure: WOUND EXPLORATION;  Surgeon: Tennis Must, MD;  Location: [facility];  Service: Orthopedics;  Laterality: Left;  Left Hand Repair Flexor Tendon, Possible Nerve/Artery Repair , debridement of tendon     OB History    Gravida  3   Para  2   Term  2   Preterm      AB  1   Living  2     SAB  1   TAB      Ectopic      Multiple      Live Births  2           Family History  Problem Relation Age of Onset  . Hearing loss Mother   . Asthma Father     Social History   Tobacco Use  . Smoking status: Never Smoker  . Smokeless tobacco: Never Used  Substance Use Topics  . Alcohol use: Yes    Comment: occ  . Drug use: No  Home Medications Prior to Admission medications   Medication Sig Start Date End Date Taking? Authorizing Provider  levonorgestrel (MIRENA) 20 MCG/24HR IUD 1 each by Intrauterine route continuous.    [provider]  naproxen (NAPROSYN) 500 MG tablet Take 500-1,000 mg by mouth daily as needed for moderate pain.    [provider]    Allergies    Patient has no known allergies.  Review of Systems   Review of Systems  Constitutional: Negative for activity change, appetite change, fatigue and fever.  HENT: Negative for congestion.   Respiratory: Positive for shortness of breath. Negative for cough and chest tightness.   Cardiovascular: Positive for chest pain.  Gastrointestinal: Negative for abdominal pain, nausea and vomiting.  Genitourinary: Negative for dysuria, hematuria, vaginal bleeding and vaginal discharge.  Musculoskeletal: Negative for arthralgias and myalgias.  Skin: Negative for rash.    Neurological: Negative for dizziness, weakness and headaches.   all other systems are negative except as noted in the HPI and PMH.    Physical Exam Updated Vital Signs BP 126/70 (BP Location: Left Arm)   Pulse 60   Temp 99 F (37.2 C) (Oral)   Resp (!) 23   SpO2 100%   Physical Exam Vitals and nursing note reviewed.  Constitutional:      General: She is not in acute distress.    Appearance: She is well-developed. She is obese.  HENT:     Head: Normocephalic and atraumatic.     Mouth/Throat:     Pharynx: No oropharyngeal exudate.  Eyes:     Conjunctiva/sclera: Conjunctivae normal.     Pupils: Pupils are equal, round, and reactive to light.  Neck:     Comments: No meningismus. Cardiovascular:     Rate and Rhythm: Normal rate and regular rhythm.     Heart sounds: Normal heart sounds. No murmur.  Pulmonary:     Effort: Pulmonary effort is normal. No respiratory distress.     Breath sounds: Normal breath sounds.  Chest:     Chest wall: No tenderness.  Abdominal:     Palpations: Abdomen is soft.     Tenderness: There is no abdominal tenderness. There is no guarding or rebound.  Musculoskeletal:        General: No tenderness. Normal range of motion.     Cervical back: Normal range of motion and neck supple.  Skin:    General: Skin is warm.     Capillary Refill: Capillary refill takes less than 2 seconds.  Neurological:     General: No focal deficit present.     Mental Status: She is alert and oriented to person, place, and time. Mental status is at baseline.     Cranial Nerves: No cranial nerve deficit.     Motor: No abnormal muscle tone.     Coordination: Coordination normal.     Comments:  5/5 strength throughout. CN 2-12 intact.Equal grip strength.   Psychiatric:        Behavior: Behavior normal.     ED Results / Procedures / Treatments   Labs (all labs ordered are listed, but only abnormal results are displayed) Labs Reviewed  BASIC METABOLIC PANEL -  Abnormal; Notable for the following components:      Result Value   Glucose, Bld 105 (*)    All other components within normal limits  D-DIMER, QUANTITATIVE (NOT AT [facility]) - Abnormal; Notable for the following components:   D-Dimer, Quant 0.56 (*)    All other components within  normal limits  CBC  I-STAT BETA HCG BLOOD, ED ([facility], [facility], [facility] ONLY)  I-STAT BETA HCG BLOOD, ED (NOT ORDERABLE)  TROPONIN I (HIGH SENSITIVITY)  TROPONIN I (HIGH SENSITIVITY)    EKG EKG Interpretation  Date/Time:  Monday April 14 2019 09:23:56 EST Ventricular Rate:  91 PR Interval:    QRS Duration: 84 QT Interval:  355 QTC Calculation: 437 R Axis:   61 Text Interpretation: Sinus rhythm Borderline T abnormalities, diffuse leads Confirmed by Julianne Rice [id number]) on 04/14/2019 9:30:07 AM   Radiology DG Chest 2 View  Result Date: 04/14/2019 CLINICAL DATA:  Chest pain. EXAM: CHEST - 2 VIEW COMPARISON:  CT 03/31/2019.  Chest x-ray 03/31/2019. FINDINGS: Mediastinum and hilar structures normal. Lungs are clear. No pleural effusion or pneumothorax. Heart size normal. Degenerative change thoracic spine. Thoracic spine scoliosis and degenerative change. IMPRESSION: No acute cardiopulmonary disease. Electronically Signed   By: Marcello Moores  Register   On: 04/14/2019 09:56    Procedures Procedures (including critical care time)  Medications Ordered in ED Medications - No data to display  ED Course  I have reviewed the triage vital signs and the nursing notes.  Pertinent labs & imaging results that were available during my care of the patient were reviewed by me and considered in my medical decision making (see chart for details).    MDM Rules/Calculators/A&P                      Patient here with intermittent left-sided chest pain since last night that comes and goes lasting for several seconds at a time.  Associate with some shortness of breath.  Pain description is atypical for ACS.  Her EKG showed  nonspecific T wave inversions are unchanged. Chest x-ray is negative, troponin in triage is negative.  D-dimer slightly elevated at 0.56.  Patient stated she would prefer not to have repeat CT scan.  She had 1 2 weeks ago that showed no pulmonary embolism.  Her D-dimer at that time was 0.8. She prefers not have a repeat CT scan. Shared decision making used and patient agrees to forego CT scan with the understanding of a small pulmonary embolism could be missed.  She is not tachycardic, not hypoxic, not in respiratory distress. She denies any chest pain or shortness of breath and she is not having a pleuritic pain.  She is scheduled for an echocardiogram in 3 days.  She will follow up with her PCP. Continues to decline CT angiogram today.  Discussed return precautions including chest pain becomes exertional, associated shortness of breath, nausea, vomiting, diaphoresis or other concerns. Final Clinical Impression(s) / ED Diagnoses Final diagnoses:  Atypical chest pain    Rx / DC Orders ED Discharge Orders    None       Julie-Anne Torain, Annie Main, MD 04/14/19 1651

## 2019-04-14 NOTE — ED Notes (Signed)
Patient has a extra blue and gold top in the main lab 

## 2019-04-14 NOTE — Telephone Encounter (Signed)
Pt c/o of Chest Pain: STAT if CP now or developed within 24 hours  1. Are you having CP right now? Yes   2. Are you experiencing any other symptoms (ex. SOB, nausea, vomiting, sweating)? No  3. How long have you been experiencing CP? Since yesterday 04/13/19  4. Is your CP continuous or coming and going? Coming and going   5. Have you taken Nitroglycerin? No ?

## 2019-04-14 NOTE — ED Triage Notes (Signed)
Per pt, states sharp chest pain on and off for 2 weeks-states she was seen 2 weeks ago-referred to cardiologist-scheduled for echo-states she started having pain again last night-called PCP and they told her to come here

## 2019-04-16 NOTE — Telephone Encounter (Signed)
This does not sound like cardiac chest pain.  I see that se did not go to the [facility] ER.  Glenetta Hew, MD

## 2019-04-17 ENCOUNTER — Ambulatory Visit (HOSPITAL_BASED_OUTPATIENT_CLINIC_OR_DEPARTMENT_OTHER): Payer: Commercial Managed Care - PPO

## 2019-04-17 ENCOUNTER — Other Ambulatory Visit: Payer: Self-pay

## 2019-04-17 DIAGNOSIS — R0602 Shortness of breath: Secondary | ICD-10-CM

## 2019-04-17 DIAGNOSIS — R079 Chest pain, unspecified: Secondary | ICD-10-CM | POA: Diagnosis not present

## 2019-04-17 DIAGNOSIS — I517 Cardiomegaly: Secondary | ICD-10-CM | POA: Diagnosis not present

## 2019-04-21 ENCOUNTER — Telehealth: Payer: Self-pay | Admitting: Cardiology

## 2019-04-21 NOTE — Telephone Encounter (Signed)
New message   Please call patient with echo results

## 2019-04-21 NOTE — Telephone Encounter (Signed)
Informed pt that once MD review results, his nurse will call with recommendations. Pt verbalized understanding.

## 2019-04-22 NOTE — Telephone Encounter (Signed)
LM2CB 

## 2019-04-22 NOTE — Telephone Encounter (Signed)
Echocardiogram reviewed looks pretty normal.  Normal pump function no regional wall motion normalities.  Normal valves.  This is good news.  Glenetta Hew, MD

## 2019-04-23 NOTE — Telephone Encounter (Signed)
Pt informed of providers result & recommendations. Pt verbalized understanding. No further questions . Has appt scheduled 04-28-2019

## 2019-04-23 NOTE — Telephone Encounter (Signed)
Spoke to patient result- given . Patient states she has other question for doctor would like an appointment to discuss--  Appointment schedule for 04/27/18 Patient verbalized understanding.

## 2019-04-28 ENCOUNTER — Encounter: Payer: Self-pay | Admitting: Cardiology

## 2019-04-28 ENCOUNTER — Ambulatory Visit (INDEPENDENT_AMBULATORY_CARE_PROVIDER_SITE_OTHER): Payer: Commercial Managed Care - PPO | Admitting: Cardiology

## 2019-04-28 ENCOUNTER — Other Ambulatory Visit: Payer: Self-pay

## 2019-04-28 VITALS — BP 126/80 | HR 72 | Temp 97.2°F | Ht 64.0 in | Wt 266.0 lb

## 2019-04-28 DIAGNOSIS — R0602 Shortness of breath: Secondary | ICD-10-CM

## 2019-04-28 DIAGNOSIS — R079 Chest pain, unspecified: Secondary | ICD-10-CM

## 2019-04-28 DIAGNOSIS — I517 Cardiomegaly: Secondary | ICD-10-CM | POA: Diagnosis not present

## 2019-04-28 NOTE — Progress Notes (Signed)
Primary Care Provider: [facility] [place] Cardiologist: No primary care provider on file. Electrophysiologist:   Clinic Note: No chief complaint on file.  HPI:    Tara Bender is a 34 y.o. female who is being seen today for the evaluation of CARDIOMEGALY on CT at the request of [facility] - in response to recent ER visit for CP - thought to be Pleurisy.  CTA - no PE, Bilateral atelectasis - cardiomegaly.   Tara Bender was evaluated in the ER on April 05, 2019  Recent Hospitalizations:   ER April 05, 2019: Presented with chest pain that was right-sided began around 7:00 the previous night.  Was constant.  Worse with deep inspiration.  Sharp and radiated to the upper back. -->  Was found to have atelectasis on CT scan, symptoms felt to be pleurisy.  04/14/2019: ER visit for chest pain.  Ruled out for MI.  Reviewed  CV studies:    The following studies were reviewed today: (if available, images/films reviewed: From Epic Chart or Care Everywhere) . CTA Chest 03/31/2019: No PE.  Bibasilar atelectasis or consolidation R>L.  Cardiomegaly . 2D Echo: EF 50 to 55%.  Normal wall motion.  Normal atrial sizes.  Normal valves.   Interval History:   Tara Bender is a very pleasant, morbidly obese woman presents here today for follow-up of what sounds like musculoskeletal chest pain but was diagnosed as having pleurisy.  Due to relatively atypical nature of her symptoms, I do not feel that these are consistent with angina.  Occurred at rest, or fleeting episodes of sharp stabbing discomfort in the left upper chest.  Sometimes radiate from the back to the front of front to the back.  We did check a 2D echocardiogram to exclude any pericardial effusion and that did not show any gross abnormalities.  Nothing explain chest pain.  She still has episodes of chest pain that can happen once or twice an hour and then may go several hours  without any symptoms and maybe even couple days without symptoms.  They always occur pretty much without doing any activity.  At rest or with minimal activity but no exertion.  CV Review of Symptoms (Summary): positive for - chest pain and DOE with more prominent exertion negative for - edema, irregular heartbeat, orthopnea, palpitations, paroxysmal nocturnal dyspnea, rapid heart rate, shortness of breath or Syncope/near syncope, TIA/amaurosis fugax, claudication  The patient DOES NOT have symptoms concerning for COVID-19 infection (fever, chills, cough, or new shortness of breath).  The patient is practicing social distancing.  Tries to do her best with masking, but not always.  Rarely goes out shopping.  Working towards social distancing and masking.   REVIEWED OF SYSTEMS   A comprehensive ROS was performed. Review of Systems  Constitutional: Negative for malaise/fatigue and weight loss.  Respiratory: Positive for cough (Not much). Negative for shortness of breath.   Cardiovascular: Positive for chest pain.  Gastrointestinal: Negative for blood in stool and melena.  Genitourinary: Negative for hematuria.  Musculoskeletal: Negative for falls.  Neurological: Negative for dizziness, weakness and headaches.  Psychiatric/Behavioral: Negative for depression. The patient is nervous/anxious (Mostly because of this discomfort).   All other systems reviewed and are negative.  I have reviewed and (if needed) personally updated the patient's problem list, medications, allergies, past medical and surgical history, social and family history.   PAST MEDICAL HISTORY   Past Medical History:  Diagnosis Date  . Bronchitis   .  Bronchitis    uses inhaler prn - rarely uses  . Hand laceration    lt  . Headache(784.0)   . Meningitis   . Obesity   . SVD (spontaneous vaginal delivery)    x 1     PAST SURGICAL HISTORY   Past Surgical History:  Procedure Laterality Date  . DILATION AND EVACUATION  N/A 08/16/2012   Procedure: DILATATION AND EVACUATION;  Surgeon: Delice Lesch, MD;  Location: [facility];  Service: Gynecology;  Laterality: N/A;  . left hand surgery     tendon repair  . WOUND EXPLORATION  03/14/2012   Procedure: WOUND EXPLORATION;  Surgeon: Tennis Must, MD;  Location: [facility];  Service: Orthopedics;  Laterality: Left;  Left Hand Repair Flexor Tendon, Possible Nerve/Artery Repair , debridement of tendon     MEDICATIONS/ALLERGIES   No outpatient medications have been marked as taking for the 04/28/19 encounter (Office Visit) with Leonie Man, MD.    No Known Allergies   SOCIAL HISTORY/FAMILY HISTORY   Social History   Tobacco Use  . Smoking status: Never Smoker  . Smokeless tobacco: Never Used  Substance Use Topics  . Alcohol use: Yes    Comment: occ  . Drug use: No   Social History   Social History Narrative  . Not on file    Family History family history includes Asthma in her father; Hearing loss in her mother.   OBJCTIVE -PE, EKG, labs   Wt Readings from Last 3 Encounters:  04/28/19 266 lb (120.7 kg)  04/09/19 268 lb 12.8 oz (121.9 kg)  03/31/19 261 lb 0.4 oz (118.4 kg)    Physical Exam: BP 126/80   Pulse 72   Temp (!) 97.2 F (36.2 C)   Ht 5\' 4"  (1.626 m)   Wt 266 lb (120.7 kg)   SpO2 100%   BMI 45.66 kg/m  Physical Exam  Constitutional: She is oriented to person, place, and time. She appears well-developed and well-nourished. No distress.  Morbidly obese.  Well-groomed  HENT:  Head: Normocephalic and atraumatic.  Eyes: EOM are normal.  Neck: No JVD present. Carotid bruit is not present.  Cardiovascular: Normal rate, regular rhythm, S1 normal, S2 normal and intact distal pulses.  No extrasystoles are present. PMI is not displaced (Unable to palpate). Exam reveals distant heart sounds. Exam reveals no gallop and no friction rub.  No murmur heard. Pulmonary/Chest: Effort normal and breath sounds normal. No  respiratory distress. She has no wheezes. She has no rales.  Musculoskeletal:        General: No edema. Normal range of motion.     Cervical back: Normal range of motion and neck supple.  Neurological: She is alert and oriented to person, place, and time.  Psychiatric: She has a normal mood and affect. Her behavior is normal. Judgment and thought content normal.  Vitals reviewed.   Adult ECG Report  Rate: 69 ;  Rhythm: normal sinus rhythm and LVH.  Nonspecific ST and T wave changes, likely repolarization changes.;   Narrative Interpretation: Relatively normal EKG  Recent Labs: Has not had lipid check in years. Lab Results  Component Value Date   CHOL 144 11/29/2007   HDL 52 11/29/2007   LDLCALC 84 11/29/2007   TRIG 38 11/29/2007   CHOLHDL 2.8 Ratio 11/29/2007   Lab Results  Component Value Date   CREATININE 0.72 04/14/2019   BUN 7 04/14/2019   NA 139 04/14/2019   K 3.5 04/14/2019  CL 106 04/14/2019   CO2 25 04/14/2019    ASSESSMENT/PLAN    Problem List Items Addressed This Visit    Cardiomegaly    Probably radiation shadow on CT scan.  Not consistent on echocardiogram.      Chest pain of uncertain etiology    Chest pain again seems musculoskeletal more so than related to pleurisy.  Location in the left central chest is not really consistent with pleuritic pain. I suspect it probably is more musculoskeletal but just to ensure that she is not having any exertional chest discomfort we will check a GXT.  Would probably not want to check a nuclear stress test for fear of false positive anterior wall defect.      Relevant Orders   GXT------EXERCISE TOLERANCE TEST   Shortness of breath - Primary    Pretty normal echo despite LVH on EKG.  I suspect this is related to obesity and deconditioning, but we will check a GXT/ETT in order to reassure her for getting back into the gym. Plan: Check GXT (will need Covid test prior to)      Relevant Orders   GXT------EXERCISE  TOLERANCE TEST      COVID-19 Education: The signs and symptoms of COVID-19 were discussed with the patient and how to seek care for testing (follow up with PCP or arrange E-visit).   The importance of social distancing was discussed today.  I spent a total of 49minutes with the patient and chart review. >  50% of the time was spent in direct patient consultation.  Additional time spent with chart review (studies, outside notes, etc): 8 min Total Time: 26 min   Current medicines are reviewed at length with the patient today.  (+/- concerns) n/a   Patient Instructions / Medication Changes & Studies & Tests Ordered   Patient Instructions  Medication Instructions:  No changes   Lab Work:  Not needed  Testing/Procedures: You will need to have covid test ( [address] [facility]  3 days  Prior to this test  ( self isolate)  will be schedule at [address] Your physician has requested that you have an exercise tolerance test.   Follow-Up: At [facility], you and your health needs are our priority.  As part of our continuing mission to provide you with exceptional heart care, we have created designated Provider Care Teams.  These Care Teams include your primary Cardiologist (physician) and Advanced Practice Providers (APPs -  Physician Assistants and Nurse Practitioners) who all work together to provide you with the care you need, when you need it.  Your next appointment:   1 month(s) if test is abnormal  The format for your next appointment:   Virtual   Provider:   Glenetta Hew, MD    Studies Ordered:   Orders Placed This Encounter  Procedures  . GXT------EXERCISE TOLERANCE TEST     Glenetta Hew, M.D., M.S. Interventional Cardiologist   Pager # [phone] Phone # [phone] [address] [place],  [address]   Thank you for choosing Heartcare at [facility]!!

## 2019-04-28 NOTE — Patient Instructions (Signed)
Medication Instructions:  No changes   Lab Work:  Not needed  Testing/Procedures: You will need to have covid test ( [address] [facility]  3 days  Prior to this test  ( self isolate)  will be schedule at [address] Your physician has requested that you have an exercise tolerance test.   Follow-Up: At [facility], you and your health needs are our priority.  As part of our continuing mission to provide you with exceptional heart care, we have created designated Provider Care Teams.  These Care Teams include your primary Cardiologist (physician) and Advanced Practice Providers (APPs -  Physician Assistants and Nurse Practitioners) who all work together to provide you with the care you need, when you need it.  Your next appointment:   1 month(s) if test is abnormal  The format for your next appointment:   Virtual   Provider:   Glenetta Hew, MD

## 2019-04-29 NOTE — Assessment & Plan Note (Signed)
Chest pain again seems musculoskeletal more so than related to pleurisy.  Location in the left central chest is not really consistent with pleuritic pain. I suspect it probably is more musculoskeletal but just to ensure that she is not having any exertional chest discomfort we will check a GXT.  Would probably not want to check a nuclear stress test for fear of false positive anterior wall defect.

## 2019-04-29 NOTE — Assessment & Plan Note (Signed)
Pretty normal echo despite LVH on EKG.  I suspect this is related to obesity and deconditioning, but we will check a GXT/ETT in order to reassure her for getting back into the gym. Plan: Check GXT (will need Covid test prior to)

## 2019-04-29 NOTE — Assessment & Plan Note (Signed)
Probably radiation shadow on CT scan.  Not consistent on echocardiogram.

## 2019-05-08 ENCOUNTER — Telehealth (HOSPITAL_COMMUNITY): Payer: Self-pay

## 2019-05-08 NOTE — Telephone Encounter (Signed)
Encounter complete. 

## 2019-05-09 ENCOUNTER — Telehealth (HOSPITAL_COMMUNITY): Payer: Self-pay | Admitting: *Deleted

## 2019-05-09 ENCOUNTER — Other Ambulatory Visit (HOSPITAL_COMMUNITY)
Admission: RE | Admit: 2019-05-09 | Discharge: 2019-05-09 | Disposition: A | Discharge status: Home / Self Care | Payer: Commercial Managed Care - PPO | Source: Ambulatory Visit | Attending: Cardiology | Admitting: Cardiology

## 2019-05-09 DIAGNOSIS — Z01812 Encounter for preprocedural laboratory examination: Secondary | ICD-10-CM | POA: Insufficient documentation

## 2019-05-09 DIAGNOSIS — Z20822 Contact with and (suspected) exposure to covid-19: Secondary | ICD-10-CM | POA: Diagnosis not present

## 2019-05-09 NOTE — Telephone Encounter (Signed)
Close encounter 

## 2019-05-10 LAB — NOVEL CORONAVIRUS, NAA (HOSP ORDER, SEND-OUT TO REF LAB; TAT 18-24 HRS): SARS-CoV-2, NAA: NOT DETECTED

## 2019-05-13 ENCOUNTER — Ambulatory Visit (HOSPITAL_COMMUNITY)
Admission: RE | Admit: 2019-05-13 | Discharge: 2019-05-13 | Disposition: A | Discharge status: Home / Self Care | Payer: Commercial Managed Care - PPO | Source: Ambulatory Visit | Attending: Cardiovascular Disease | Admitting: Cardiovascular Disease

## 2019-05-13 ENCOUNTER — Other Ambulatory Visit: Payer: Self-pay

## 2019-05-13 DIAGNOSIS — R079 Chest pain, unspecified: Secondary | ICD-10-CM | POA: Insufficient documentation

## 2019-05-13 DIAGNOSIS — R0602 Shortness of breath: Secondary | ICD-10-CM | POA: Diagnosis present

## 2019-05-13 LAB — EXERCISE TOLERANCE TEST
Estimated workload: 7.9 METS
Exercise duration (min): 6 min
Exercise duration (sec): 39 s
MPHR: 189 {beats}/min
Peak HR: 176 {beats}/min
Percent HR: 93 %
Rest HR: 88 {beats}/min

## 2019-06-04 ENCOUNTER — Telehealth: Payer: Self-pay | Admitting: Cardiology

## 2019-06-04 ENCOUNTER — Ambulatory Visit (INDEPENDENT_AMBULATORY_CARE_PROVIDER_SITE_OTHER): Payer: Commercial Managed Care - PPO | Admitting: Otolaryngology

## 2019-06-04 ENCOUNTER — Other Ambulatory Visit: Payer: Self-pay

## 2019-06-04 ENCOUNTER — Encounter (INDEPENDENT_AMBULATORY_CARE_PROVIDER_SITE_OTHER): Payer: Self-pay | Admitting: Otolaryngology

## 2019-06-04 ENCOUNTER — Ambulatory Visit: Payer: Commercial Managed Care - PPO | Admitting: Cardiology

## 2019-06-04 VITALS — Temp 97.7°F

## 2019-06-04 DIAGNOSIS — H93A3 Pulsatile tinnitus, bilateral: Secondary | ICD-10-CM | POA: Diagnosis not present

## 2019-06-04 NOTE — Telephone Encounter (Signed)
New message   Patient has questions about taking a baby aspirin and a steriod for hearing her pulse in her ear. The Ent advised that she needs to take this medication. Please call to discuss.

## 2019-06-04 NOTE — Telephone Encounter (Signed)
Should not be an issue to take aspirin plus steroid.  Glenetta Hew, MD

## 2019-06-04 NOTE — Progress Notes (Signed)
HPI: Tara Bender is a 34 y.o. female who presents is referred by her PCP for evaluation of tinnitus in both ears.  Left side seems worse than right.  It comes and goes.  Sometimes it sounds like her pulse.  She denies any ear pain.  No loud noise exposure or trauma to the ear.  She states that her hearing sometimes seems to fade.  But she is hearing normal today..  Past Medical History:  Diagnosis Date  . Bronchitis   . Bronchitis    uses inhaler prn - rarely uses  . Hand laceration    lt  . Headache(784.0)   . Meningitis   . Obesity   . SVD (spontaneous vaginal delivery)    x 1   Past Surgical History:  Procedure Laterality Date  . DILATION AND EVACUATION N/A 08/16/2012   Procedure: DILATATION AND EVACUATION;  Surgeon: Delice Lesch, MD;  Location: [facility];  Service: Gynecology;  Laterality: N/A;  . left hand surgery     tendon repair  . WOUND EXPLORATION  03/14/2012   Procedure: WOUND EXPLORATION;  Surgeon: Tennis Must, MD;  Location: [place];  Service: Orthopedics;  Laterality: Left;  Left Hand Repair Flexor Tendon, Possible Nerve/Artery Repair , debridement of tendon   Social History   Socioeconomic History  . Marital status: Single    Spouse name: Not on file  . Number of children: Not on file  . Years of education: Not on file  . Highest education level: Not on file  Occupational History  . Not on file  Tobacco Use  . Smoking status: Never Smoker  . Smokeless tobacco: Never Used  Substance and Sexual Activity  . Alcohol use: Yes    Comment: occ  . Drug use: No  . Sexual activity: Yes    Birth control/protection: I.U.D.  Other Topics Concern  . Not on file  Social History Narrative  . Not on file   Social Determinants of Health   Financial Resource Strain:   . Difficulty of Paying Living Expenses: Not on file  Food Insecurity:   . Worried About Charity fundraiser in the Last Year: Not on file  . Ran Out of Food in the Last Year:  Not on file  Transportation Needs:   . Lack of Transportation (Medical): Not on file  . Lack of Transportation (Non-Medical): Not on file  Physical Activity:   . Days of Exercise per Week: Not on file  . Minutes of Exercise per Session: Not on file  Stress:   . Feeling of Stress : Not on file  Social Connections:   . Frequency of Communication with Friends and Family: Not on file  . Frequency of Social Gatherings with Friends and Family: Not on file  . Attends Religious Services: Not on file  . Active Member of Clubs or Organizations: Not on file  . Attends Archivist Meetings: Not on file  . Marital Status: Not on file   Family History  Problem Relation Age of Onset  . Hearing loss Mother   . Asthma Father    No Known Allergies Prior to Admission medications   Medication Sig Start Date End Date Taking? Authorizing Provider  levonorgestrel (MIRENA) 20 MCG/24HR IUD 1 each by Intrauterine route continuous.   Yes [provider]     Positive ROS: No headaches.  No vertigo or dizziness.  All other systems have been reviewed and were otherwise negative with the  exception of those mentioned in the HPI and as above.  Physical Exam: Constitutional: Alert, well-appearing, no acute distress Ears: External ears without lesions or tenderness. Ear canals are clear bilaterally with intact, clear TMs bilaterally.  On auscultation of the ears she has no objective tinnitus or pulsatile tinnitus. Nasal: External nose without lesions. Septum midline with mild rhinitis.. Clear nasal passages.  No signs of infection Oral: Lips and gums without lesions. Tongue and palate mucosa without lesions. Posterior oropharynx clear. Neck: No palpable adenopathy or masses.  No carotid bruits.  However on light pressure of the upper neck below the ears I was able to stop the slight pulse that she hears in her ears on both sides. Respiratory: Breathing comfortably  Skin: No facial/neck lesions  or rash noted.  Audiogram demonstrated normal hearing in both ears with SRT's of 15 dB bilaterally and type A tympanograms bilaterally.  Procedures  Assessment: Pulsatile tinnitus with normal hearing evaluation  Plan: Recommended initially taking a baby aspirin once a day for the next 4 weeks.  If symptoms do not improve I prescribed a 6-day steroid taper to try. If the pulsatile tinnitus persists beyond 6 weeks she will follow-up here for recheck.   Radene Journey, MD   CC:

## 2019-06-05 NOTE — Telephone Encounter (Signed)
Returned call to patient,  Patient made aware of Dr. Ellyn Hack recommendations. Verbalized understanding.

## 2019-06-05 NOTE — Telephone Encounter (Signed)
Follow Up:   Pt calling to find out what Dr Ellyn Hack said

## 2019-06-06 ENCOUNTER — Encounter (INDEPENDENT_AMBULATORY_CARE_PROVIDER_SITE_OTHER): Payer: Self-pay

## 2019-06-13 ENCOUNTER — Telehealth: Payer: Self-pay | Admitting: Cardiology

## 2019-06-13 NOTE — Telephone Encounter (Signed)
New Message  Patient is calling in to transfer care from Dr. Ellyn Hack to Dr. Radford Pax. Please confirm if it is ok to transfer care and schedule visit with Dr. Radford Pax.

## 2019-06-13 NOTE — Telephone Encounter (Signed)
Reviewed notes from Dr. Ellyn Hack and cardiac workup was fine.  Needs to stay with Dr. Ellyn Hack

## 2019-06-16 NOTE — Telephone Encounter (Signed)
Left message for patient to call back  

## 2019-06-16 NOTE — Telephone Encounter (Signed)
Patient returning call.

## 2019-06-16 NOTE — Telephone Encounter (Signed)
Advised patient that Dr. Radford Pax has reviewed office notes from Dr. Ellyn Hack and that she is fine from a cardiac standpoint so does not need to be seen by Dr. Radford Pax. Patient states that she is still having concerns regarding cardiac issues

## 2019-06-17 NOTE — Telephone Encounter (Signed)
There may be concern as far as her wanting to see a female cardiologist.  If that is the reasoning, then I am okay with transferring care.  Glenetta Hew, MD

## 2019-07-01 ENCOUNTER — Ambulatory Visit (INDEPENDENT_AMBULATORY_CARE_PROVIDER_SITE_OTHER): Payer: Commercial Managed Care - PPO | Admitting: Cardiology

## 2019-07-01 ENCOUNTER — Encounter: Payer: Self-pay | Admitting: Cardiology

## 2019-07-01 ENCOUNTER — Other Ambulatory Visit: Payer: Self-pay

## 2019-07-01 VITALS — BP 132/74 | HR 88 | Ht 64.0 in | Wt 267.8 lb

## 2019-07-01 DIAGNOSIS — R079 Chest pain, unspecified: Secondary | ICD-10-CM | POA: Diagnosis not present

## 2019-07-01 DIAGNOSIS — H93A9 Pulsatile tinnitus, unspecified ear: Secondary | ICD-10-CM

## 2019-07-01 DIAGNOSIS — R0602 Shortness of breath: Secondary | ICD-10-CM | POA: Diagnosis not present

## 2019-07-01 NOTE — Patient Instructions (Signed)
Medication Instructions:  Your physician recommends that you continue on your current medications as directed. Please refer to the Current Medication list given to you today.  *If you need a refill on your cardiac medications before your next appointment, please call your pharmacy*   Lab Work: TODAY: ESR and CRP If you have labs (blood work) drawn today and your tests are completely normal, you will receive your results only by: Marland Kitchen MyChart Message (if you have MyChart) OR . A paper copy in the mail If you have any lab test that is abnormal or we need to change your treatment, we will call you to review the results.  Follow-Up: At [facility], you and your health needs are our priority.  As part of our continuing mission to provide you with exceptional heart care, we have created designated Provider Care Teams.  These Care Teams include your primary Cardiologist (physician) and Advanced Practice Providers (APPs -  Physician Assistants and Nurse Practitioners) who all work together to provide you with the care you need, when you need it.  Follow up with Dr. Radford Pax as needed.    Other Instructions You have been referred to pulmonology.

## 2019-07-01 NOTE — Progress Notes (Signed)
Cardiology Office Consult Note:    Date:  07/01/2019   ID:  Tara Bender, DOB 1985/10/06, MRN [id number]  PCP:  [facility] [place]  Cardiologist:  Fransico Him, MD    Referring MD: [facility] Jerilynn Mages*   Chief Complaint  Patient presents with  . Chest Pain    History of Present Illness:    Tara Bender is a 34 y.o. female who was referred to Dr. Ellyn Hack for cardiomegaly noted on Chest CT done due to right sided CP.  She was seen in Dec 2020 in ER with right sided CP and was sharp and radiated into the upper back.  Chest CT showed atelectasis and was felt to have pleurisy.  She was seen again back in the ER with recurrent CP and rule out for MI.  Chest CTA showed no PE and 2D echo showed low normal LVF with EF 50-55% with normal wall motion and normal valves.  She was seen by Dr. Ellyn Hack and her pain was felt to not be consistent with angina due to the fleeting sharp stabbing nature of her sx.  There was no pericardial effusion on echo.  She was seen back by him in January and was still having CP.  He felt it was MSK in origin and ETT showed no ischemia.    She requested to see another Cardiologist for a second opinion and is now here for consult in referral by [employer] at [facility].  She continues to have intermittent sharp stabbing chest pain across her chest and sometimes in her back.  She says that Dr. Ellyn Hack felt it was MCK pain from large breasts pulling on her chest and back and she was not happy with his explanation and requested a second opinion.  She has had this in the past and gone to the ER and she says that when she sees ER MDs they think its a PE but her tests are always normal.  Nothing makes it better or worse and it is random.  She has noticed some DOE as well.  She also complains of pulsatile tinnitus and saw ENT with no explanation and they prescribed ASA and Steroids which she has not taken.    Past Medical History:    Diagnosis Date  . Bronchitis   . Bronchitis    uses inhaler prn - rarely uses  . Hand laceration    lt  . Headache(784.0)   . Meningitis   . Obesity   . SVD (spontaneous vaginal delivery)    x 1    Past Surgical History:  Procedure Laterality Date  . DILATION AND EVACUATION N/A 08/16/2012   Procedure: DILATATION AND EVACUATION;  Surgeon: Delice Lesch, MD;  Location: [facility];  Service: Gynecology;  Laterality: N/A;  . left hand surgery     tendon repair  . WOUND EXPLORATION  03/14/2012   Procedure: WOUND EXPLORATION;  Surgeon: Tennis Must, MD;  Location: [place];  Service: Orthopedics;  Laterality: Left;  Left Hand Repair Flexor Tendon, Possible Nerve/Artery Repair , debridement of tendon    Current Medications: Current Meds  Medication Sig  . levonorgestrel (MIRENA) 20 MCG/24HR IUD 1 each by Intrauterine route continuous.     Allergies:   Patient has no known allergies.   Social History   Socioeconomic History  . Marital status: Single    Spouse name: Not on file  . Number of children: Not on file  . Years  of education: Not on file  . Highest education level: Not on file  Occupational History  . Not on file  Tobacco Use  . Smoking status: Never Smoker  . Smokeless tobacco: Never Used  Substance and Sexual Activity  . Alcohol use: Yes    Comment: occ  . Drug use: No  . Sexual activity: Yes    Birth control/protection: I.U.D.  Other Topics Concern  . Not on file  Social History Narrative  . Not on file   Social Determinants of Health   Financial Resource Strain:   . Difficulty of Paying Living Expenses: Not on file  Food Insecurity:   . Worried About Charity fundraiser in the Last Year: Not on file  . Ran Out of Food in the Last Year: Not on file  Transportation Needs:   . Lack of Transportation (Medical): Not on file  . Lack of Transportation (Non-Medical): Not on file  Physical Activity:   . Days of Exercise per Week: Not on  file  . Minutes of Exercise per Session: Not on file  Stress:   . Feeling of Stress : Not on file  Social Connections:   . Frequency of Communication with Friends and Family: Not on file  . Frequency of Social Gatherings with Friends and Family: Not on file  . Attends Religious Services: Not on file  . Active Member of Clubs or Organizations: Not on file  . Attends Archivist Meetings: Not on file  . Marital Status: Not on file     Family History: The patient's family history includes Asthma in her father; Hearing loss in her mother.  ROS:   Please see the history of present illness.    ROS  All other systems reviewed and negative.   EKGs/Labs/Other Studies Reviewed:    The following studies were reviewed today: ETT  EKG:  EKG is not ordered today.    Recent Labs: 04/14/2019: BUN 7; Creatinine, Ser 0.72; Hemoglobin 12.6; Platelets 273; Potassium 3.5; Sodium 139   Recent Lipid Panel    Component Value Date/Time   CHOL 144 11/29/2007 2045   TRIG 38 11/29/2007 2045   HDL 52 11/29/2007 2045   CHOLHDL 2.8 Ratio 11/29/2007 2045   VLDL 8 11/29/2007 2045   LDLCALC 84 11/29/2007 2045    Physical Exam:    VS:  BP 132/74   Pulse 88   Ht _0  (1.626 m)   Wt 267 lb 12.8 oz (121.5 kg)   SpO2 96%   BMI 45.97 kg/m     Wt Readings from Last 3 Encounters:  07/01/19 267 lb 12.8 oz (121.5 kg)  04/28/19 266 lb (120.7 kg)  04/09/19 268 lb 12.8 oz (121.9 kg)     GEN:  Well nourished, well developed in no acute distress HEENT: Normal NECK: No JVD; No carotid bruits LYMPHATICS: No lymphadenopathy CARDIAC: RRR, no murmurs, rubs, gallops RESPIRATORY:  Clear to auscultation without rales, wheezing or rhonchi  ABDOMEN: Soft, non-tender, non-distended MUSCULOSKELETAL:  No edema; No deformity  SKIN: Warm and dry NEUROLOGIC:  Alert and oriented x 3 PSYCHIATRIC:  Normal affect   ASSESSMENT:    1. Chest pain of uncertain etiology   2. Shortness of breath   3.  Pulsatile tinnitus    PLAN:    In order of problems listed above:  1.  Chest pain -very atypical in nature - sharp and stabbing -2D echo with normal LVF and no RWMAs and no pericardial effusion -Chest CTA  with no PE and showed atelectasis. -I reviewed findings of studies with her and reassured her that her CP sounds musculoskeletal in etiology  -I have decided to refer to Pulmonary to make sure she does not have adult onset asthma -I also encouraged her to followup with her PCP -will order a CRP and ESR to rule out acute inflammation  2.  SOB -her weight is likely contributing to this -Chest CTA neg for PE -2D echo and ETT normal -refer to Pulmonary  3.  Pulsatile tinnitus -seen by ENT and prescribed steroids and ASA but has not taken it -recommended followup with PCP  Medication Adjustments/Labs and Tests Ordered: Current medicines are reviewed at length with the patient today.  Concerns regarding medicines are outlined above.  Orders Placed This Encounter  Procedures  . Sedimentation rate  . C-reactive protein  . Ambulatory referral to Pulmonology   No orders of the defined types were placed in this encounter.   Signed, Fransico Him, MD  07/01/2019 2:32 PM    [facility]

## 2019-07-02 LAB — SEDIMENTATION RATE: Sed Rate: 15 mm/hr (ref 0–32)

## 2019-07-02 LAB — C-REACTIVE PROTEIN: CRP: 4 mg/L (ref 0–10)

## 2019-07-16 ENCOUNTER — Other Ambulatory Visit: Payer: Self-pay

## 2019-07-16 ENCOUNTER — Ambulatory Visit (INDEPENDENT_AMBULATORY_CARE_PROVIDER_SITE_OTHER): Payer: Commercial Managed Care - PPO | Admitting: Pulmonary Disease

## 2019-07-16 ENCOUNTER — Encounter: Payer: Self-pay | Admitting: Pulmonary Disease

## 2019-07-16 VITALS — BP 122/66 | HR 80 | Temp 97.1°F | Ht 64.0 in | Wt 270.6 lb

## 2019-07-16 DIAGNOSIS — R0789 Other chest pain: Secondary | ICD-10-CM | POA: Diagnosis not present

## 2019-07-16 DIAGNOSIS — R0602 Shortness of breath: Secondary | ICD-10-CM | POA: Diagnosis not present

## 2019-07-16 DIAGNOSIS — G4733 Obstructive sleep apnea (adult) (pediatric): Secondary | ICD-10-CM | POA: Diagnosis not present

## 2019-07-16 NOTE — Progress Notes (Signed)
Subjective:    Patient ID: Tara Bender, female    DOB: April 14, 1986, 34 y.o.   MRN: [id number]  Shortness of breath, chest discomfort Initial episode happened in December  Has had couple of occasions of chest discomfort, difficulty catching her breath Pain lasts for few seconds Occasionally has chest discomfort when she is talking  Denies any wheezing  Has been less active with having chest discomfort  As always been overweight Admits to snoring No witnessed apneas No OSA in the family Headache around-the-clock Occasional dryness of her mouth  Followed up with cardiology-no significant findings  CT scan of the chest negative Markers of inflammation negative on recent study  Non-smoker  Past Medical History:  Diagnosis Date  . Bronchitis   . Bronchitis    uses inhaler prn - rarely uses  . Hand laceration    lt  . Headache(784.0)   . Meningitis   . Obesity   . SVD (spontaneous vaginal delivery)    x 1   Social History   Socioeconomic History  . Marital status: Single    Spouse name: Not on file  . Number of children: Not on file  . Years of education: Not on file  . Highest education level: Not on file  Occupational History  . Not on file  Tobacco Use  . Smoking status: Never Smoker  . Smokeless tobacco: Never Used  Substance and Sexual Activity  . Alcohol use: Yes    Comment: occ  . Drug use: No  . Sexual activity: Yes    Birth control/protection: I.U.D.  Other Topics Concern  . Not on file  Social History Narrative  . Not on file   Social Determinants of Health   Financial Resource Strain:   . Difficulty of Paying Living Expenses:   Food Insecurity:   . Worried About Charity fundraiser in the Last Year:   . Arboriculturist in the Last Year:   Transportation Needs:   . Film/video editor (Medical):   Marland Kitchen Lack of Transportation (Non-Medical):   Physical Activity:   . Days of Exercise per Week:   . Minutes of Exercise per Session:     Stress:   . Feeling of Stress :   Social Connections:   . Frequency of Communication with Friends and Family:   . Frequency of Social Gatherings with Friends and Family:   . Attends Religious Services:   . Active Member of Clubs or Organizations:   . Attends Archivist Meetings:   Marland Kitchen Marital Status:   Intimate Partner Violence:   . Fear of Current or Ex-Partner:   . Emotionally Abused:   Marland Kitchen Physically Abused:   . Sexually Abused:    Family History  Problem Relation Age of Onset  . Hearing loss Mother   . Asthma Father    Review of Systems  Constitutional: Negative for fever and unexpected weight change.  HENT: Negative for congestion, dental problem, ear pain, nosebleeds, postnasal drip, rhinorrhea, sinus pressure, sneezing, sore throat and trouble swallowing.   Eyes: Negative for redness and itching.  Respiratory: Positive for shortness of breath. Negative for cough, chest tightness and wheezing.   Cardiovascular: Positive for palpitations. Negative for leg swelling.  Gastrointestinal: Negative for nausea and vomiting.  Genitourinary: Negative for dysuria.  Musculoskeletal: Negative for joint swelling.  Skin: Negative for rash.  Allergic/Immunologic: Negative.  Negative for environmental allergies, food allergies and immunocompromised state.  Neurological: Positive for headaches.  Hematological: Does  not bruise/bleed easily.  Psychiatric/Behavioral: Negative for dysphoric mood. The patient is not nervous/anxious.       Objective:   Physical Exam Constitutional:      Appearance: She is obese.  HENT:     Head: Normocephalic.     Nose: Nose normal. No congestion.     Mouth/Throat:     Mouth: Mucous membranes are moist.     Comments: Crowded oropharynx, Mallampati 4 Eyes:     Extraocular Movements: Extraocular movements intact.     Pupils: Pupils are equal, round, and reactive to light.  Cardiovascular:     Rate and Rhythm: Normal rate and regular rhythm.      Pulses: Normal pulses.     Heart sounds: Normal heart sounds. No murmur. No friction rub.  Pulmonary:     Effort: Pulmonary effort is normal. No respiratory distress.     Breath sounds: Normal breath sounds. No stridor. No wheezing or rhonchi.  Musculoskeletal:     Cervical back: Normal range of motion and neck supple. No rigidity or tenderness.  Skin:    General: Skin is warm.  Neurological:     General: No focal deficit present.     Mental Status: She is alert.  Psychiatric:        Mood and Affect: Mood normal.    Vitals:   07/16/19 1504  BP: 122/66  Pulse: 80  Temp: (!) 97.1 F (36.2 C)  SpO2: 100%  CT scan of the chest reviewed from December 2020-some atelectasis at the lung bases Echocardiogram shows borderline low ejection fraction X-ray shows cardiomegaly   CRP, ESR within normal limits Assessment & Plan:  .  Chest pain chest discomfort  .  Shortness of breath on exertion  .  Possible obstructive sleep apnea  Plan Schedule you for breathing study  Schedule you for a home sleep study  We will see you in about 8 weeks  Continue with your graded exercises  Call with significant concerns

## 2019-07-16 NOTE — Patient Instructions (Signed)
Chest pain, shortness of breath  -We will get a breathing study on you  Possible sleep apnea  -We will schedule you for a sleep study  We will see you back in about 8 weeks  Call with significant concerns  Continue graded exercises

## 2019-07-18 ENCOUNTER — Telehealth: Payer: Self-pay | Admitting: Unknown Physician Specialty

## 2019-07-18 NOTE — Telephone Encounter (Signed)
Called to discuss with patient about Covid symptoms and the use of bamlanivimab, a monoclonal antibody infusion for those with mild to moderate Covid symptoms and at a high risk of hospitalization.  Pt is qualified for this infusion at the [facility] infusion center due to [employer] left to call back

## 2019-07-19 ENCOUNTER — Encounter: Payer: Self-pay | Admitting: Unknown Physician Specialty

## 2019-07-19 ENCOUNTER — Telehealth: Payer: Self-pay | Admitting: Unknown Physician Specialty

## 2019-07-19 NOTE — Telephone Encounter (Signed)
  I connected by phone with Tara Bender on 07/19/2019 at 9:20 AM to discuss the potential use of an new treatment for mild to moderate COVID-19 viral infection in non-hospitalized patients.  This patient is a 34 y.o. female that meets the FDA criteria for Emergency Use Authorization of bamlanivimab or casirivimab\imdevimab.  Has a (+) direct SARS-CoV-2 viral test result  Has mild or moderate COVID-19   Is ? 32 years of age and weighs ? 40 kg  Is NOT hospitalized due to COVID-19  Is NOT requiring oxygen therapy or requiring an increase in baseline oxygen flow rate due to COVID-19  Is within 10 days of symptom onset  Has at least one of the high risk factor(s) for progression to severe COVID-19 and/or hospitalization as defined in EUA.  Specific high risk criteria : BMI >/= 35   I have spoken and communicated the following to the patient or parent/caregiver:  1. FDA has authorized the emergency use of bamlanivimab and casirivimab\imdevimab for the treatment of mild to moderate COVID-19 in adults and pediatric patients with positive results of direct SARS-CoV-2 viral testing who are 3 years of age and older weighing at least 40 kg, and who are at high risk for progressing to severe COVID-19 and/or hospitalization.  2. The significant known and potential risks and benefits of bamlanivimab and casirivimab\imdevimab, and the extent to which such potential risks and benefits are unknown.  3. Information on available alternative treatments and the risks and benefits of those alternatives, including clinical trials.  4. Patients treated with bamlanivimab and casirivimab\imdevimab should continue to self-isolate and use infection control measures (e.g., wear mask, isolate, social distance, avoid sharing personal items, clean and disinfect "high touch" surfaces, and frequent handwashing) according to CDC guidelines.   5. The patient or parent/caregiver has the option to accept or refuse  bamlanivimab or casirivimab\imdevimab .  After reviewing this information with the patient, she would like to get the infusion but may have trouble working out childcare.  She will call me back if she does.    Tara Bender 07/19/2019 9:20 AM

## 2019-07-21 ENCOUNTER — Telehealth: Payer: Self-pay | Admitting: Pulmonary Disease

## 2019-07-21 NOTE — Telephone Encounter (Signed)
I called and spoke with the patient and made her aware that we will have to cancel her PFT and follow up and she will not be able to have her PFT until 3 month post Covid which will be after 10/18/19. She verbalized understanding. Will send to Dr. Jenetta Downer as an Juluis Rainier

## 2019-08-02 ENCOUNTER — Other Ambulatory Visit (HOSPITAL_COMMUNITY): Payer: Commercial Managed Care - PPO

## 2019-08-04 ENCOUNTER — Other Ambulatory Visit: Payer: Self-pay

## 2019-08-04 ENCOUNTER — Ambulatory Visit (INDEPENDENT_AMBULATORY_CARE_PROVIDER_SITE_OTHER): Payer: Commercial Managed Care - PPO | Admitting: Otolaryngology

## 2019-08-04 ENCOUNTER — Encounter (INDEPENDENT_AMBULATORY_CARE_PROVIDER_SITE_OTHER): Payer: Self-pay | Admitting: Otolaryngology

## 2019-08-04 VITALS — Temp 97.5°F

## 2019-08-04 DIAGNOSIS — J039 Acute tonsillitis, unspecified: Secondary | ICD-10-CM | POA: Diagnosis not present

## 2019-08-04 NOTE — Progress Notes (Signed)
HPI: Tara Bender is a 34 y.o. female who returns today for evaluation of recent tonsil problems.  She has had a sore throat for about a week now.  She has also noticed some white debris on her tonsils.  Patient has had a negative Covid test. She also hears her heartbeat in both ears.  She has not noted any hearing problems.  Past Medical History:  Diagnosis Date  . Bronchitis   . Bronchitis    uses inhaler prn - rarely uses  . Hand laceration    lt  . Headache(784.0)   . Meningitis   . Obesity   . SVD (spontaneous vaginal delivery)    x 1   Past Surgical History:  Procedure Laterality Date  . DILATION AND EVACUATION N/A 08/16/2012   Procedure: DILATATION AND EVACUATION;  Surgeon: Delice Lesch, MD;  Location: [facility];  Service: Gynecology;  Laterality: N/A;  . left hand surgery     tendon repair  . WOUND EXPLORATION  03/14/2012   Procedure: WOUND EXPLORATION;  Surgeon: Tennis Must, MD;  Location: [facility];  Service: Orthopedics;  Laterality: Left;  Left Hand Repair Flexor Tendon, Possible Nerve/Artery Repair , debridement of tendon   Social History   Socioeconomic History  . Marital status: Single    Spouse name: Not on file  . Number of children: Not on file  . Years of education: Not on file  . Highest education level: Not on file  Occupational History  . Not on file  Tobacco Use  . Smoking status: Never Smoker  . Smokeless tobacco: Never Used  Substance and Sexual Activity  . Alcohol use: Yes    Comment: occ  . Drug use: No  . Sexual activity: Yes    Birth control/protection: I.U.D.  Other Topics Concern  . Not on file  Social History Narrative  . Not on file   Social Determinants of Health   Financial Resource Strain:   . Difficulty of Paying Living Expenses:   Food Insecurity:   . Worried About Charity fundraiser in the Last Year:   . Arboriculturist in the Last Year:   Transportation Needs:   . Film/video editor  (Medical):   Marland Kitchen Lack of Transportation (Non-Medical):   Physical Activity:   . Days of Exercise per Week:   . Minutes of Exercise per Session:   Stress:   . Feeling of Stress :   Social Connections:   . Frequency of Communication with Friends and Family:   . Frequency of Social Gatherings with Friends and Family:   . Attends Religious Services:   . Active Member of Clubs or Organizations:   . Attends Archivist Meetings:   Marland Kitchen Marital Status:    Family History  Problem Relation Age of Onset  . Hearing loss Mother   . Asthma Father    No Known Allergies Prior to Admission medications   Medication Sig Start Date End Date Taking? Authorizing Provider  levonorgestrel (MIRENA) 20 MCG/24HR IUD 1 each by Intrauterine route continuous.   Yes [provider]     Positive ROS: Otherwise negative  All other systems have been reviewed and were otherwise negative with the exception of those mentioned in the HPI and as above.  Physical Exam: Constitutional: Alert, well-appearing, no acute distress Ears: External ears without lesions or tenderness. Ear canals are clear bilaterally with intact, clear TMs.  Auscultation of the ears reveals no objective  pulsatile tinnitus. Nasal: External nose without lesions.. Clear nasal passages Oral: Lips and gums without lesions. Tongue and palate mucosa without lesions.  She has moderate sized inflamed tonsils bilaterally with white exudate over both tonsils. Neck: Mild adenopathy bilaterally slightly worse on the right side which is tender to palpation.  Auscultation of the neck reveals no carotid bruits. Respiratory: Breathing comfortably Cardiac exam: Regular rate and rhythm without significant murmur Skin: No facial/neck lesions or rash noted.  Procedures  Assessment: Acute tonsillitis  Plan: Prescribed Augmentin 875 mg twice daily for 10 days. She will follow-up as needed.   Radene Journey, MD

## 2019-08-27 ENCOUNTER — Ambulatory Visit
Admission: RE | Admit: 2019-08-27 | Discharge: 2019-08-27 | Disposition: A | Discharge status: Home / Self Care | Payer: Commercial Managed Care - PPO | Source: Ambulatory Visit | Attending: Physician Assistant | Admitting: Physician Assistant

## 2019-08-27 ENCOUNTER — Other Ambulatory Visit: Payer: Self-pay

## 2019-08-27 DIAGNOSIS — D134 Benign neoplasm of liver: Secondary | ICD-10-CM

## 2019-08-27 MED ORDER — GADOBENATE DIMEGLUMINE 529 MG/ML IV SOLN
20.0000 mL | Freq: Once | INTRAVENOUS | Status: AC | PRN
  Administered 2019-08-27: 20 mL via INTRAVENOUS

## 2019-09-02 ENCOUNTER — Other Ambulatory Visit: Payer: Self-pay

## 2019-09-02 ENCOUNTER — Ambulatory Visit: Payer: Commercial Managed Care - PPO

## 2019-09-02 DIAGNOSIS — G4733 Obstructive sleep apnea (adult) (pediatric): Secondary | ICD-10-CM

## 2019-09-05 ENCOUNTER — Telehealth: Payer: Self-pay | Admitting: Pulmonary Disease

## 2019-09-05 ENCOUNTER — Telehealth: Payer: Self-pay | Admitting: *Deleted

## 2019-09-05 DIAGNOSIS — G4733 Obstructive sleep apnea (adult) (pediatric): Secondary | ICD-10-CM

## 2019-09-05 NOTE — Telephone Encounter (Signed)
Call patient  Sleep study result  Date of study: 09/02/2019   Impression: Mild obstructive sleep apnea Mild oxygen desaturations  Recommendation: Weight loss and regular exercise, improved sleep hygiene Treatment with CPAP may be considered as an option of treatment if there is significant daytime sleepiness Symptom monitoring, aggressive weight loss measures, adequate hours of sleep and clinical follow-up

## 2019-09-08 NOTE — Telephone Encounter (Signed)
Results of home sleep study.    Impressions:  Mild obstructive sleep apnea  Mild oxygen desaturations  Recommendations:     Weight loss and regular exercise, improved sleep hygiene will be an option of treatment if patient is asymptomatic. She should be cautioned against driving when sleepy and against medications with sedative side effects. Treatment with CPAP may be considered as an option of treatment if there is significant daytime sleepiness. Symptoms monitoring, aggressive weight loss measures, adequate hours of sleep and clinical follow up.  Patient contacted and results reviewed, patient verbalized understanding of recommendations.

## 2019-09-11 NOTE — Telephone Encounter (Signed)
Unsure who called the pt as it looks like she has already received her results? LMTCB x 1

## 2019-09-11 NOTE — Telephone Encounter (Signed)
Calling to see if her sleep study results can be rescanned into her chart so that she is able to see it in her mychart because she is not able to currently see the results. Pt can be reached at [phone].

## 2019-09-11 NOTE — Telephone Encounter (Signed)
Spoke with pt and rescheduled f/u appt with Dr Gala Murdoch to discuss sleep study. Mailed a copy of sleep study results to pt. Nothing further needed at this time.

## 2019-09-11 NOTE — Telephone Encounter (Signed)
Pt returning a phone call. Pt can be reached at [phone].

## 2019-10-07 ENCOUNTER — Other Ambulatory Visit: Payer: Self-pay

## 2019-10-07 ENCOUNTER — Encounter: Payer: Self-pay | Admitting: Pulmonary Disease

## 2019-10-07 ENCOUNTER — Ambulatory Visit (INDEPENDENT_AMBULATORY_CARE_PROVIDER_SITE_OTHER): Payer: Commercial Managed Care - PPO | Admitting: Pulmonary Disease

## 2019-10-07 VITALS — BP 128/88 | HR 84 | Temp 98.5°F | Ht 64.0 in | Wt 271.2 lb

## 2019-10-07 DIAGNOSIS — G4733 Obstructive sleep apnea (adult) (pediatric): Secondary | ICD-10-CM | POA: Diagnosis not present

## 2019-10-07 MED ORDER — ESZOPICLONE 2 MG PO TABS: 2.0000 mg | ORAL_TABLET | Freq: Every evening | ORAL | 1 refills | Status: DC | PRN

## 2019-10-07 NOTE — Patient Instructions (Addendum)
Mild obstructive sleep apnea with significant daytime symptoms  We will refer her you to a DME company and have you set up with a CPAP  We will follow-up with you in about 2 months to see how the CPAP is working  Call with significant concerns  Continue with weight loss efforts  Trial with Johnnye Sima

## 2019-10-07 NOTE — Progress Notes (Signed)
Subjective:    Patient ID: Tara Bender, female    DOB: 1985/06/24, 34 y.o.   MRN: [id number]  Shortness of breath, chest discomfort Initial episode happened in December  Had a sleep study done showing mild obstructive sleep apnea She does have daytime sleepiness Nonrestorative sleep Occasionally has difficulty falling asleep  She will easily take a nap during the day if the situation permits  As always been overweight Admits to snoring No witnessed apneas No OSA in the family Headache around-the-clock Occasional dryness of her mouth  Followed up with cardiology-no significant findings  CT scan of the chest negative Markers of inflammation negative on recent study  Non-smoker  Past Medical History:  Diagnosis Date  . Bronchitis   . Bronchitis    uses inhaler prn - rarely uses  . Hand laceration    lt  . Headache(784.0)   . Meningitis   . Obesity   . SVD (spontaneous vaginal delivery)    x 1   Social History   Socioeconomic History  . Marital status: Single    Spouse name: Not on file  . Number of children: Not on file  . Years of education: Not on file  . Highest education level: Not on file  Occupational History  . Not on file  Tobacco Use  . Smoking status: Never Smoker  . Smokeless tobacco: Never Used  Vaping Use  . Vaping Use: Never used  Substance and Sexual Activity  . Alcohol use: Yes    Comment: occ  . Drug use: No  . Sexual activity: Yes    Birth control/protection: I.U.D.  Other Topics Concern  . Not on file  Social History Narrative  . Not on file   Social Determinants of Health   Financial Resource Strain:   . Difficulty of Paying Living Expenses:   Food Insecurity:   . Worried About Charity fundraiser in the Last Year:   . Arboriculturist in the Last Year:   Transportation Needs:   . Film/video editor (Medical):   Marland Kitchen Lack of Transportation (Non-Medical):   Physical Activity:   . Days of Exercise per Week:   .  Minutes of Exercise per Session:   Stress:   . Feeling of Stress :   Social Connections:   . Frequency of Communication with Friends and Family:   . Frequency of Social Gatherings with Friends and Family:   . Attends Religious Services:   . Active Member of Clubs or Organizations:   . Attends Archivist Meetings:   Marland Kitchen Marital Status:   Intimate Partner Violence:   . Fear of Current or Ex-Partner:   . Emotionally Abused:   Marland Kitchen Physically Abused:   . Sexually Abused:    Family History  Problem Relation Age of Onset  . Hearing loss Mother   . Asthma Father    Review of Systems  Constitutional: Negative for fever and unexpected weight change.  HENT: Negative for congestion, dental problem, ear pain, nosebleeds, postnasal drip, rhinorrhea, sinus pressure, sneezing, sore throat and trouble swallowing.   Eyes: Negative for redness and itching.  Respiratory: Positive for apnea and shortness of breath. Negative for cough, chest tightness and wheezing.   Cardiovascular: Positive for palpitations. Negative for leg swelling.  Gastrointestinal: Negative for nausea and vomiting.  Genitourinary: Negative for dysuria.  Musculoskeletal: Negative for joint swelling.  Skin: Negative for rash.  Allergic/Immunologic: Negative.  Negative for environmental allergies, food allergies and immunocompromised state.  Neurological: Positive for headaches.  Hematological: Does not bruise/bleed easily.  Psychiatric/Behavioral: Positive for sleep disturbance. Negative for dysphoric mood. The patient is not nervous/anxious.       Objective:   Physical Exam Constitutional:      Appearance: She is obese.  HENT:     Head: Normocephalic.     Nose: Nose normal. No congestion.     Mouth/Throat:     Mouth: Mucous membranes are moist.     Comments: Crowded oropharynx, Mallampati 4 Eyes:     Extraocular Movements: Extraocular movements intact.     Pupils: Pupils are equal, round, and reactive to light.    Cardiovascular:     Rate and Rhythm: Normal rate and regular rhythm.     Pulses: Normal pulses.     Heart sounds: Normal heart sounds. No murmur heard.  No friction rub.  Pulmonary:     Effort: Pulmonary effort is normal. No respiratory distress.     Breath sounds: Normal breath sounds. No stridor. No wheezing or rhonchi.  Musculoskeletal:     Cervical back: No rigidity or tenderness.  Neurological:     Mental Status: She is alert.    Vitals:   10/07/19 1637  BP: 128/88  Pulse: 84  Temp: 98.5 F (36.9 C)  SpO2: 99%   CT scan of the chest reviewed from December 2020-some atelectasis at the lung bases Echocardiogram shows borderline low ejection fraction X-ray shows cardiomegaly   CRP, ESR within normal limits  Sleep study does reveal mild obstructive sleep apnea with mild oxygen desaturations Assessment & Plan:  .  Chest pain chest discomfort  .  Shortness of breath on exertion  .  Mild obstructive sleep apnea with mild oxygen desaturation with excessive daytime sleepiness, nonrestorative sleep  Plan We will schedule patient for set up of CPAP therapy CPAP 5-15  Trial with Lunesta for sleep onset and sleep maintenance insomnia  Follow-up in about 2 months following initiation of treatment  Continue with your graded exercises  Call with significant concerns

## 2019-10-08 ENCOUNTER — Ambulatory Visit: Payer: Commercial Managed Care - PPO | Admitting: Pulmonary Disease

## 2019-11-12 ENCOUNTER — Telehealth: Payer: Self-pay | Admitting: Pulmonary Disease

## 2019-11-12 NOTE — Telephone Encounter (Addendum)
Spoke with the pt  She is c/o increased SOB and sharp pain in her chest x 1 wk  She states she has had right leg pain as well and this started x 3 days ago, mentioned worried about possible clot  I advised needs to head to ED for eval  She verbalized understanding Unfortunately no openings left in the office this wk  Will forward to Dr Ander Slade to make him aware

## 2019-11-21 ENCOUNTER — Telehealth: Payer: Self-pay | Admitting: Pulmonary Disease

## 2019-11-21 ENCOUNTER — Encounter (HOSPITAL_COMMUNITY): Payer: Self-pay | Admitting: Emergency Medicine

## 2019-11-21 ENCOUNTER — Emergency Department (HOSPITAL_COMMUNITY): Payer: Commercial Managed Care - PPO

## 2019-11-21 ENCOUNTER — Emergency Department (HOSPITAL_BASED_OUTPATIENT_CLINIC_OR_DEPARTMENT_OTHER): Payer: Commercial Managed Care - PPO

## 2019-11-21 ENCOUNTER — Other Ambulatory Visit: Payer: Self-pay

## 2019-11-21 ENCOUNTER — Emergency Department (HOSPITAL_COMMUNITY)
Admission: EM | Admit: 2019-11-21 | Discharge: 2019-11-21 | Disposition: A | Discharge status: Home / Self Care | Payer: Commercial Managed Care - PPO | Attending: Emergency Medicine | Admitting: Emergency Medicine

## 2019-11-21 DIAGNOSIS — M7989 Other specified soft tissue disorders: Secondary | ICD-10-CM | POA: Diagnosis not present

## 2019-11-21 DIAGNOSIS — R0602 Shortness of breath: Secondary | ICD-10-CM | POA: Diagnosis not present

## 2019-11-21 DIAGNOSIS — R091 Pleurisy: Secondary | ICD-10-CM | POA: Diagnosis not present

## 2019-11-21 DIAGNOSIS — R0789 Other chest pain: Secondary | ICD-10-CM | POA: Diagnosis present

## 2019-11-21 LAB — I-STAT BETA HCG BLOOD, ED (MC, WL, AP ONLY): I-stat hCG, quantitative: <5 m[IU]/mL (ref <5)

## 2019-11-21 LAB — TROPONIN I (HIGH SENSITIVITY)
Troponin I (High Sensitivity): 6 ng/L (ref <18)
Troponin I (High Sensitivity): 4 ng/L (ref <18)

## 2019-11-21 LAB — BASIC METABOLIC PANEL
Anion gap: 9 (ref 5–15)
BUN: 10 mg/dL (ref 6–20)
CO2: 22 mmol/L (ref 22–32)
Calcium: 9 mg/dL (ref 8.9–10.3)
Chloride: 106 mmol/L (ref 98–111)
Creatinine, Ser: 0.71 mg/dL (ref 0.44–1.00)
GFR calc Af Amer: >60 mL/min (ref >60)
GFR calc non Af Amer: >60 mL/min (ref >60)
Glucose, Bld: 97 mg/dL (ref 70–99)
Potassium: 3.5 mmol/L (ref 3.5–5.1)
Sodium: 137 mmol/L (ref 135–145)

## 2019-11-21 LAB — CBC
HCT: 37.4 % (ref 36.0–46.0)
Hemoglobin: 11.7 g/dL — ABNORMAL LOW (ref 12.0–15.0)
MCH: 26.4 pg (ref 26.0–34.0)
MCHC: 31.3 g/dL (ref 30.0–36.0)
MCV: 84.4 fL (ref 80.0–100.0)
Platelets: 259 10*3/uL (ref 150–400)
RBC: 4.43 MIL/uL (ref 3.87–5.11)
RDW: 14 % (ref 11.5–15.5)
WBC: 9.6 10*3/uL (ref 4.0–10.5)
nRBC: 0 % (ref 0.0–0.2)

## 2019-11-21 MED ORDER — SODIUM CHLORIDE 0.9% FLUSH: 3.0000 mL | Freq: Once | INTRAVENOUS | Status: DC

## 2019-11-21 MED ORDER — METHOCARBAMOL 500 MG PO TABS: 500.0000 mg | ORAL_TABLET | Freq: Two times a day (BID) | ORAL | 0 refills | Status: DC

## 2019-11-21 MED ORDER — LIDOCAINE VISCOUS HCL 2 % MT SOLN
15.0000 mL | Freq: Once | OROMUCOSAL | Status: DC
  Filled 2019-11-21: qty 15

## 2019-11-21 MED ORDER — ALUM & MAG HYDROXIDE-SIMETH 200-200-20 MG/5ML PO SUSP
30.0000 mL | Freq: Once | ORAL | Status: DC
  Filled 2019-11-21: qty 30

## 2019-11-21 MED ORDER — PANTOPRAZOLE SODIUM 20 MG PO TBEC: 20.0000 mg | DELAYED_RELEASE_TABLET | Freq: Every day | ORAL | 0 refills | Status: DC

## 2019-11-21 NOTE — Discharge Instructions (Signed)
At this time there does not appear to be the presence of an emergent medical condition, however there is always the potential for conditions to change. Please read and follow the below instructions.  Please return to the Emergency Department immediately for any new or worsening symptoms. Please be sure to follow up with your Primary Care Provider within one week regarding your visit today; please call their office to schedule an appointment even if you are feeling better for a follow-up visit. Please also follow-up with your cardiologist and pulmonologist.  You may use the muscle relaxer Robaxin as prescribed to help with your symptoms.  Do not drive or operate heavy machinery while taking Robaxin as it will make you drowsy.  Do not drink alcohol or take other sedating medications while taking Robaxin as this will worsen side effects. Do not use this medication if you are breast feeding. You may use the medication Protonix as it may help if your symptoms are caused by acid reflux.  Get help right away if: You have a cough that gets worse, or you cough up blood. You have very bad (severe) pain in your belly (abdomen). You pass out (faint). You have either of these for no clear reason: Sudden chest discomfort. Sudden discomfort in your arms, back, neck, or jaw. You have shortness of breath at any time. You suddenly start to sweat, or your skin gets clammy. You feel sick to your stomach (nauseous). You throw up (vomit). You suddenly feel lightheaded or dizzy. You feel very weak or tired. Your heart starts to beat fast, or it feels like it is skipping beats. You have any new/concerning or worsening of symptoms. These symptoms may be an emergency. Do not wait to see if the symptoms will go away. Get medical help right away. Call your local emergency services (911 in the [place] Do not drive yourself to the hospital.  Please read the additional information packets attached to your discharge  summary.  Do not take your medicine if  develop an itchy rash, swelling in your mouth or lips, or difficulty breathing; call 911 and seek immediate emergency medical attention if this occurs.  You may review your lab tests and imaging results in their entirety on your MyChart account.  Please discuss all results of fully with your primary care provider and other specialist at your follow-up visit.  Note: Portions of this text may have been transcribed using voice recognition software. Every effort was made to ensure accuracy; however, inadvertent computerized transcription errors may still be present.

## 2019-11-21 NOTE — ED Notes (Signed)
Pt states she does not want the medication and that she wants to leave. EDP made aware.

## 2019-11-21 NOTE — Telephone Encounter (Signed)
Pt returning call.  [phone]

## 2019-11-21 NOTE — Progress Notes (Signed)
Lower extremity venous bilateral study completed.   Results relayed to physician.  See Cv Proc for preliminary results.   Darlin Coco

## 2019-11-21 NOTE — Telephone Encounter (Signed)
Called and spoke with pt letting her know the info stated by Brian and she verbalized understanding. Nothing further needed. 

## 2019-11-21 NOTE — ED Provider Notes (Signed)
[facility] EMERGENCY DEPARTMENT Provider Note   CSN: [id number] Arrival date & time: 11/21/19  0114     History Chief Complaint  Patient presents with  . Chest Pain    Tara Bender is a 34 y.o. female history includes obesity, bronchitis, cardiomegaly.  Patient presents today for chest pain shortness of breath onset 1 week ago.  She describes a sharp pain in the center of her chest nonradiating no clear aggravating or alleviating factors, pain has waxed and waned over the past week and is currently mild.  She attempted ibuprofen x1 with temporary relief of her symptoms.  She reports mild shortness of breath when pain is severe.  No shortness of breath at this time.  Patient reports that she has had similar pain in the past, most recently in December she reports she is currently being seen by both cardiology and pulmonology.  Additionally patient reports mild leg swelling, she feels that her right leg may be more swollen than her left, she reports this is not new and that she has had ultrasound of her leg before which was negative for blood clot.  She denies fever/chills, fall/injury, headache, cough/hemoptysis, abdominal pain, nausea/vomiting, diarrhea, diaphoresis, extremity pain, numbness/tingling, weakness, history of blood clot, history of cancer or any additional concerns.  HPI     Past Medical History:  Diagnosis Date  . Bronchitis   . Bronchitis    uses inhaler prn - rarely uses  . Hand laceration    lt  . Headache(784.0)   . Meningitis   . Obesity   . SVD (spontaneous vaginal delivery)    x 1    Patient Active Problem List   Diagnosis Date Noted  . Cardiomegaly 04/09/2019  . Chest pain of uncertain etiology 53/29/9242  . Shortness of breath 04/09/2019  . Normal vaginal delivery 12/21/2013  . Decreased fetal movement 12/20/2013  . Decreased fetal movement determined by examination 12/20/2013    Past Surgical History:  Procedure  Laterality Date  . DILATION AND EVACUATION N/A 08/16/2012   Procedure: DILATATION AND EVACUATION;  Surgeon: Delice Lesch, MD;  Location: [facility];  Service: Gynecology;  Laterality: N/A;  . left hand surgery     tendon repair  . WOUND EXPLORATION  03/14/2012   Procedure: WOUND EXPLORATION;  Surgeon: Tennis Must, MD;  Location: [facility];  Service: Orthopedics;  Laterality: Left;  Left Hand Repair Flexor Tendon, Possible Nerve/Artery Repair , debridement of tendon     OB History    Gravida  3   Para  2   Term  2   Preterm      AB  1   Living  2     SAB  1   TAB      Ectopic      Multiple      Live Births  2           Family History  Problem Relation Age of Onset  . Hearing loss Mother   . Asthma Father     Social History   Tobacco Use  . Smoking status: Never Smoker  . Smokeless tobacco: Never Used  Vaping Use  . Vaping Use: Never used  Substance Use Topics  . Alcohol use: Yes    Comment: occ  . Drug use: No    Home Medications Prior to Admission medications   Medication Sig Start Date End Date Taking? Authorizing Provider  eszopiclone (LUNESTA) 2 MG TABS tablet  Take 1 tablet (2 mg total) by mouth at bedtime as needed for sleep. Take immediately before bedtime 10/07/19   Laurin Coder, MD  levonorgestrel (MIRENA) 20 MCG/24HR IUD 1 each by Intrauterine route continuous.    [provider]  methocarbamol (ROBAXIN) 500 MG tablet Take 1 tablet (500 mg total) by mouth 2 (two) times daily. 11/21/19   Nuala Alpha A, PA-C  pantoprazole (PROTONIX) 20 MG tablet Take 1 tablet (20 mg total) by mouth daily. 11/21/19   Deliah Boston, PA-C    Allergies    Patient has no known allergies.  Review of Systems   Review of Systems Ten systems are reviewed and are negative for acute change except as noted in the HPI  Physical Exam Updated Vital Signs BP 117/72 (BP Location: Right Wrist)   Pulse 65   Temp 98.3 F (36.8 C)  (Oral)   Resp 16   Ht _0  (1.626 m)   Wt (!) 132 kg   SpO2 100%   BMI 49.95 kg/m   Physical Exam Constitutional:      General: She is not in acute distress.    Appearance: Normal appearance. She is well-developed. She is not ill-appearing or diaphoretic.  HENT:     Head: Normocephalic and atraumatic.  Eyes:     General: Vision grossly intact. Gaze aligned appropriately.     Pupils: Pupils are equal, round, and reactive to light.  Neck:     Trachea: Trachea and phonation normal.  Cardiovascular:     Rate and Rhythm: Normal rate and regular rhythm.     Pulses:          Radial pulses are 2+ on the right side and 2+ on the left side.       Dorsalis pedis pulses are 2+ on the right side and 2+ on the left side.  Pulmonary:     Effort: Pulmonary effort is normal. No respiratory distress.     Breath sounds: Normal breath sounds.  Abdominal:     General: There is no distension.     Palpations: Abdomen is soft.     Tenderness: There is no abdominal tenderness. There is no guarding or rebound.  Musculoskeletal:        General: Normal range of motion.     Cervical back: Normal range of motion.     Right lower leg: No tenderness.     Left lower leg: No tenderness.  Skin:    General: Skin is warm and dry.  Neurological:     Mental Status: She is alert.     GCS: GCS eye subscore is 4. GCS verbal subscore is 5. GCS motor subscore is 6.     Comments: Speech is clear and goal oriented, follows commands Major Cranial nerves without deficit, no facial droop Moves extremities without ataxia, coordination intact  Psychiatric:        Behavior: Behavior normal.     ED Results / Procedures / Treatments   Labs (all labs ordered are listed, but only abnormal results are displayed) Labs Reviewed  CBC - Abnormal; Notable for the following components:      Result Value   Hemoglobin 11.7 (*)    All other components within normal limits  BASIC METABOLIC PANEL  I-STAT BETA HCG BLOOD, ED  ([facility], [facility], [facility] ONLY)  TROPONIN I (HIGH SENSITIVITY)  TROPONIN I (HIGH SENSITIVITY)    EKG EKG Interpretation  Date/Time:  Friday November 21 2019 01:25:30 EDT Ventricular Rate:  73 PR Interval:  146 QRS Duration: 82 QT Interval:  366 QTC Calculation: 403 R Axis:   60 Text Interpretation: Normal sinus rhythm Normal ECG No STEMI Confirmed by Octaviano Glow [phone]) on 11/21/2019 7:36:35 AM   Radiology DG Chest 2 View  Result Date: 11/21/2019 CLINICAL DATA:  Chest pain EXAM: CHEST - 2 VIEW COMPARISON:  None. FINDINGS: The heart size and mediastinal contours are within normal limits. Both lungs are clear. The visualized skeletal structures are unremarkable. IMPRESSION: No active cardiopulmonary disease. Electronically Signed   By: Prudencio Pair M.D.   On: 11/21/2019 01:45   VAS [place] LOWER EXTREMITY VENOUS (DVT) ([facility] and [facility] 7a-7p)  Result Date: 11/21/2019  Lower Venous DVTStudy Indications: Swelling, and Pain.  Limitations: Poor ultrasound/tissue interface. Comparison Study: No prior studies. Performing Technologist: Darlin Coco  Examination Guidelines: A complete evaluation includes B-mode imaging, spectral Doppler, color Doppler, and power Doppler as needed of all accessible portions of each vessel. Bilateral testing is considered an integral part of a complete examination. Limited examinations for reoccurring indications may be performed as noted. The reflux portion of the exam is performed with the patient in reverse Trendelenburg.  +---------+---------------+---------+-----------+----------+----------------+ RIGHT    CompressibilityPhasicitySpontaneityPropertiesThrombus Aging   +---------+---------------+---------+-----------+----------+----------------+ CFV      Full           Yes      Yes                                   +---------+---------------+---------+-----------+----------+----------------+ SFJ      Full           Yes      Yes                                    +---------+---------------+---------+-----------+----------+----------------+ FV Prox  Full           Yes      Yes                                   +---------+---------------+---------+-----------+----------+----------------+ FV Mid   Full                                                          +---------+---------------+---------+-----------+----------+----------------+ FV DistalFull                                                          +---------+---------------+---------+-----------+----------+----------------+ PFV      Full           Yes      Yes                                   +---------+---------------+---------+-----------+----------+----------------+ POP      Full           Yes      Yes                                   +---------+---------------+---------+-----------+----------+----------------+  PTV      Full           Yes      Yes                                   +---------+---------------+---------+-----------+----------+----------------+ PERO                    Yes      Yes                  Patent by color. +---------+---------------+---------+-----------+----------+----------------+   +---------+---------------+---------+-----------+----------+--------------+ LEFT     CompressibilityPhasicitySpontaneityPropertiesThrombus Aging +---------+---------------+---------+-----------+----------+--------------+ CFV      Full           Yes      Yes                                 +---------+---------------+---------+-----------+----------+--------------+ SFJ      Full           Yes      Yes                                 +---------+---------------+---------+-----------+----------+--------------+ FV Prox  Full           Yes      Yes                                 +---------+---------------+---------+-----------+----------+--------------+ FV Mid   Full                                                         +---------+---------------+---------+-----------+----------+--------------+ FV DistalFull                                                        +---------+---------------+---------+-----------+----------+--------------+ PFV      Full           Yes      Yes                                 +---------+---------------+---------+-----------+----------+--------------+ POP      Full                                                        +---------+---------------+---------+-----------+----------+--------------+ PTV      Full           Yes      Yes                                 +---------+---------------+---------+-----------+----------+--------------+ PERO     Full           Yes  Yes                                 +---------+---------------+---------+-----------+----------+--------------+     Summary: RIGHT: - There is no evidence of deep vein thrombosis in the lower extremity. However, portions of this examination were limited- see technologist comments above.  - No cystic structure found in the popliteal fossa.  LEFT: - There is no evidence of deep vein thrombosis in the lower extremity.  - No cystic structure found in the popliteal fossa.  *See table(s) above for measurements and observations.    Preliminary     Procedures Procedures (including critical care time)  Medications Ordered in ED Medications  sodium chloride flush (NS) 0.9 % injection 3 mL (3 mLs Intravenous Not Given 11/21/19 0826)  alum & mag hydroxide-simeth (MAALOX/MYLANTA) 200-200-20 MG/5ML suspension 30 mL (30 mLs Oral Refused 11/21/19 0826)    And  lidocaine (XYLOCAINE) 2 % viscous mouth solution 15 mL (15 mLs Oral Refused 11/21/19 3664)    ED Course  I have reviewed the triage vital signs and the nursing notes.  Pertinent labs & imaging results that were available during my care of the patient were reviewed by me and considered in my medical decision making (see chart for details).    MDM  Rules/Calculators/A&P                          Additional history obtained from: 1. Nursing notes from this visit. 2. Electronic medical record reviewed.  Patient had 2 ED visits for pleurisy and atypical chest pain in December.  She had negative PE study performed on March 30, 2020.  Patient has had multiple follow-up visits with cardiology and pulmonology since that time. Most recent cardiology visit was on July 01, 2019.  Diagnosis of chest pain uncertain etiology, thought to be very atypical.  Echocardiogram showed normal LVEF.  No RWMA's or pericardial effusions.  Thought to be possibly musculoskeletal and they encourage PCP follow-up.  Inflammatory markers CRP and ESR were negative.  She was referred to pulmonology. It appears she is being set up for CPAP for her sleep apnea by pulmonology.   ---------------------------------------------- 34 year old female presents today with chest pain and shortness of breath which she reports is exactly similar to pain she experienced in December which had a reassuring work-up.  Denies any abnormal features today.  Additionally she has no infectious type symptoms.  She is overall well-appearing and in no acute distress.  She has been waiting over 5 hours in the ER prior to my initial evaluation.  Chest pain work-up was initiated in triage and reviewed below.   High-sensitivity troponin within normal limits x2, 6-->4 BMP shows no electrolyte derangement, AKI or gap. CBC shows mild anemia at 11.7, no leukocytosis to suggest infection. Beta hCG negative. Chest x-ray:  IMPRESSION:  No active cardiopulmonary disease.   EKG: Normal sinus rhythm Normal ECG No STEMI Confirmed by Octaviano Glow [phone]) on 11/21/2019 7:36:35 AM  Patient symptoms today sound very atypical.  There is no indication for a third troponin.  History and presentation is inconsistent with ACS, PE, dissection, pneumonia or other acute cardiopulmonary etiologies at this time.  Patient does  complain of leg swelling however there is no discernible difference in the lower extremities on physical examination, will obtain ultrasound studies today to assess for DVT however the suspicion is lower at this time.  She had negative PE work-up in December and with pain that is very similar to then do not feel PE study is indicated currently. Finally this could possibly be reflux as well will give GI cocktail and start patient on PPI.  She has no abdominal pain or tenderness to suggest cholecystitis, PUD/perforation or other emergent intra-abdominal pathologies at this time. --------------- I was informed by RN that patient refused GI cocktail and is requesting to leave.  I reevaluated the patient she is resting comfortably fully dressed on the edge of the bed. Patient reports she is upset because she does not feel her symptoms are attributable to acid reflux and she does not want the GI cocktail we spoke about on initial evaluation. She is agreeable to stay for DVT studies. --------------- DVT Study:  Summary:  RIGHT:  - There is no evidence of deep vein thrombosis in the lower extremity. However, portions of this examination were limited- see technologist comments above.    - No cystic structure found in the popliteal fossa.    LEFT:  - There is no evidence of deep vein thrombosis in the lower extremity.    - No cystic structure found in the popliteal fossa.  -------------------- Low risk by Wells and PERC negative, work-up suggests atypical chest pain at this time no indication for further work-up.  We will treat patient with Protonix for suspected reflux etiology.  Also costochondritis/musculoskeletal pain is a possibility and patient is requesting a muscle relaxer, will prescribe patient Robaxin 500 mg twice daily for possible musculoskeletal spasm related pain.  I encouraged her to follow-up with her primary care doctor, cardiologist and pulmonologist.  At this time there does not appear  to be any evidence of an acute emergency medical condition and the patient appears stable for discharge with appropriate outpatient follow up. Diagnosis was discussed with patient who verbalizes understanding of care plan and is agreeable to discharge. I have discussed return precautions with patient who verbalizes understanding. Patient encouraged to follow-up with their PCP, cardiology, pulmonology. All questions answered.  Patient's case discussed with Dr.Trifan who agrees with plan to discharge with follow-up.   Note: Portions of this report may have been transcribed using voice recognition software. Every effort was made to ensure accuracy; however, inadvertent computerized transcription errors may still be present. Final Clinical Impression(s) / ED Diagnoses Final diagnoses:  Atypical chest pain    Rx / DC Orders ED Discharge Orders         Ordered    pantoprazole (PROTONIX) 20 MG tablet  Daily     Discontinue  Reprint     11/21/19 0942    methocarbamol (ROBAXIN) 500 MG tablet  2 times daily     Discontinue  Reprint     11/21/19 0942           Deliah Boston, PA-C 11/21/19 7416    Wyvonnia Dusky, MD 11/21/19 [phone]

## 2019-11-21 NOTE — Telephone Encounter (Signed)
Called pt but unable to reach. Left message for her to return call. 

## 2019-11-21 NOTE — ED Triage Notes (Signed)
Patient reports intermittent central chest pain with mild SOB onset this week , denies emesis or diaphoresis , pain increases with deep inspiration . No cough or fever .

## 2019-11-21 NOTE — Telephone Encounter (Signed)
11/21/2019  Per chart review patient had lab work that did not show an elevated troponin, negative pregnancy test and chest x-ray was completed that showed no active cardiopulmonary disease and lower extremity Dopplers were performed that preliminary showed no evidence of DVTs.  Vital signs in the emergency room were stable.  Oxygen levels 100%.  Pulse 65.  Unsure what additional test patient would like to have ordered for further evaluation.  Agree that patient needs to take the PPI as prescribed.  And follow-up with primary care.  I have no new recommendations for her.  Wyn Quaker, FNP

## 2019-11-21 NOTE — Telephone Encounter (Signed)
Called and spoke with pt who stated she went to the ED and had labwork and cxr performed as well as a doppler of both legs. Pt also had EKGs performed while in the ED.  Pt stated that she has been having chest pain as well as pain in both legs. Pt stated her right leg is worse than the left.  Pt stated her symptoms began 1 week ago.  Pt stated that a muscle relaxer was prescribed as well as pantoprazole by the ED. Pt was told to follow up with PCP but she stated since she called [place] 1 week ago when symptoms first began and is still having continuing symptoms she decided to call us back for recommendations.  Aaron Edelman, please advise on pt's ED visit as well as the chest pain and leg pain.

## 2020-01-02 ENCOUNTER — Other Ambulatory Visit: Payer: Commercial Managed Care - PPO

## 2020-01-02 ENCOUNTER — Other Ambulatory Visit: Payer: Self-pay

## 2020-01-02 DIAGNOSIS — Z20822 Contact with and (suspected) exposure to covid-19: Secondary | ICD-10-CM

## 2020-01-05 LAB — NOVEL CORONAVIRUS, NAA: SARS-CoV-2, NAA: NOT DETECTED

## 2020-04-29 DIAGNOSIS — Z30432 Encounter for removal of intrauterine contraceptive device: Secondary | ICD-10-CM | POA: Diagnosis not present

## 2020-05-17 ENCOUNTER — Encounter (HOSPITAL_COMMUNITY): Payer: Self-pay | Admitting: Emergency Medicine

## 2020-05-17 ENCOUNTER — Emergency Department (HOSPITAL_COMMUNITY): Payer: HRSA Program

## 2020-05-17 ENCOUNTER — Emergency Department (HOSPITAL_COMMUNITY)
Admission: EM | Admit: 2020-05-17 | Discharge: 2020-05-18 | Disposition: A | Discharge status: Home / Self Care | Payer: HRSA Program | Attending: Emergency Medicine | Admitting: Emergency Medicine

## 2020-05-17 ENCOUNTER — Other Ambulatory Visit: Payer: Self-pay

## 2020-05-17 DIAGNOSIS — R079 Chest pain, unspecified: Secondary | ICD-10-CM

## 2020-05-17 DIAGNOSIS — R0602 Shortness of breath: Secondary | ICD-10-CM | POA: Diagnosis present

## 2020-05-17 DIAGNOSIS — U071 COVID-19: Secondary | ICD-10-CM | POA: Diagnosis not present

## 2020-05-17 LAB — CBC
HCT: 38.5 % (ref 36.0–46.0)
Hemoglobin: 12.4 g/dL (ref 12.0–15.0)
MCH: 26.9 pg (ref 26.0–34.0)
MCHC: 32.2 g/dL (ref 30.0–36.0)
MCV: 83.5 fL (ref 80.0–100.0)
Platelets: 283 10*3/uL (ref 150–400)
RBC: 4.61 MIL/uL (ref 3.87–5.11)
RDW: 14 % (ref 11.5–15.5)
WBC: 10.3 10*3/uL (ref 4.0–10.5)
nRBC: 0 % (ref 0.0–0.2)

## 2020-05-17 LAB — BASIC METABOLIC PANEL
Anion gap: 10 (ref 5–15)
BUN: 11 mg/dL (ref 6–20)
CO2: 22 mmol/L (ref 22–32)
Calcium: 9 mg/dL (ref 8.9–10.3)
Chloride: 107 mmol/L (ref 98–111)
Creatinine, Ser: 0.73 mg/dL (ref 0.44–1.00)
GFR, Estimated: >60 mL/min (ref >60)
Glucose, Bld: 96 mg/dL (ref 70–99)
Potassium: 3.3 mmol/L — ABNORMAL LOW (ref 3.5–5.1)
Sodium: 139 mmol/L (ref 135–145)

## 2020-05-17 LAB — TROPONIN I (HIGH SENSITIVITY): Troponin I (High Sensitivity): 4 ng/L (ref <18)

## 2020-05-17 LAB — I-STAT BETA HCG BLOOD, ED (MC, WL, AP ONLY): I-stat hCG, quantitative: <5 m[IU]/mL (ref <5)

## 2020-05-17 NOTE — ED Triage Notes (Signed)
Patient is here with shortness of breath and right upper chest pain.  She is two week post covid infection.  Patient states that this has happened before, she had atelectasis.  No nausea or vomiting.

## 2020-05-18 ENCOUNTER — Emergency Department (HOSPITAL_COMMUNITY): Payer: HRSA Program

## 2020-05-18 LAB — D-DIMER, QUANTITATIVE: D-Dimer, Quant: 1.31 ug/mL-FEU — ABNORMAL HIGH (ref 0.00–0.50)

## 2020-05-18 LAB — TROPONIN I (HIGH SENSITIVITY): Troponin I (High Sensitivity): 3 ng/L (ref <18)

## 2020-05-18 MED ORDER — IOHEXOL 350 MG/ML SOLN
100.0000 mL | Freq: Once | INTRAVENOUS | Status: AC | PRN
  Administered 2020-05-18: 100 mL via INTRAVENOUS

## 2020-05-18 MED ORDER — ALBUTEROL SULFATE HFA 108 (90 BASE) MCG/ACT IN AERS: 1.0000 | INHALATION_SPRAY | Freq: Four times a day (QID) | RESPIRATORY_TRACT | 0 refills | Status: DC | PRN

## 2020-05-18 NOTE — ED Provider Notes (Signed)
[facility] EMERGENCY DEPARTMENT Provider Note   CSN: [id number] Arrival date & time: 05/17/20  2058     History Chief Complaint  Patient presents with  . Chest Pain  . Shortness of Breath    Tara Bender is a 35 y.o. female.  HPI Patient is a 35 year old female with past medical history significant for bronchitis, meningitis, obesity, chronic headaches.  Patient is presented today with complaints of cough and congestion for the past 4 days. She states that she started feeling short of breath yesterday. She states that she has noticed some discomfort with deep inhalation. She denies any hemoptysis but states that she has been coughing.   She states she has somewhat pleuritic right-sided upper chest pain that is sharp and achy and constant.  She states that this is happened to her in the past and she has been worked up for a PE and had lower extremity DVT studies that she says were negative.  She has no history of cancer no history of recent travel or immobilization no long car rides or plane rides.  She denies any unilateral or bilateral leg swelling or edema.  She denies any nausea vomiting fevers or chills.  She states that she was tested and found to be positive for COVID-19--for the third time in the past 2 years--on the 13th of this month/12 days ago.  She states she had relatively minor symptoms which seem to have improved.  No other associate symptoms.  No aggravating mitigating factors.     Past Medical History:  Diagnosis Date  . Bronchitis   . Bronchitis    uses inhaler prn - rarely uses  . Hand laceration    lt  . Headache(784.0)   . Meningitis   . Obesity   . SVD (spontaneous vaginal delivery)    x 1    Patient Active Problem List   Diagnosis Date Noted  . Cardiomegaly 04/09/2019  . Chest pain of uncertain etiology [id number]  . Shortness of breath 04/09/2019  . Normal vaginal delivery 12/21/2013  . Decreased fetal movement  12/20/2013  . Decreased fetal movement determined by examination 12/20/2013    Past Surgical History:  Procedure Laterality Date  . DILATION AND EVACUATION N/A 08/16/2012   Procedure: DILATATION AND EVACUATION;  Surgeon: Delice Lesch, MD;  Location: [facility];  Service: Gynecology;  Laterality: N/A;  . left hand surgery     tendon repair  . WOUND EXPLORATION  03/14/2012   Procedure: WOUND EXPLORATION;  Surgeon: Tennis Must, MD;  Location: [facility];  Service: Orthopedics;  Laterality: Left;  Left Hand Repair Flexor Tendon, Possible Nerve/Artery Repair , debridement of tendon     OB History    Gravida  3   Para  2   Term  2   Preterm      AB  1   Living  2     SAB  1   IAB      Ectopic      Multiple      Live Births  2           Family History  Problem Relation Age of Onset  . Hearing loss Mother   . Asthma Father     Social History   Tobacco Use  . Smoking status: Never Smoker  . Smokeless tobacco: Never Used  Vaping Use  . Vaping Use: Never used  Substance Use Topics  . Alcohol use: Yes  Comment: occ  . Drug use: No    Home Medications Prior to Admission medications   Medication Sig Start Date End Date Taking? Authorizing Provider  albuterol (VENTOLIN HFA) 108 (90 Base) MCG/ACT inhaler Inhale 1-2 puffs into the lungs every 6 (six) hours as needed for wheezing or shortness of breath. 05/18/20  Yes Kiannah Grunow S, PA  eszopiclone (LUNESTA) 2 MG TABS tablet Take 1 tablet (2 mg total) by mouth at bedtime as needed for sleep. Take immediately before bedtime 10/07/19   Laurin Coder, MD  levonorgestrel (MIRENA) 20 MCG/24HR IUD 1 each by Intrauterine route continuous.    [provider]  methocarbamol (ROBAXIN) 500 MG tablet Take 1 tablet (500 mg total) by mouth 2 (two) times daily. 11/21/19   Nuala Alpha A, PA-C  pantoprazole (PROTONIX) 20 MG tablet Take 1 tablet (20 mg total) by mouth daily. 11/21/19   Deliah Boston, PA-C    Allergies    Patient has no known allergies.  Review of Systems   Review of Systems  Constitutional: Positive for fatigue. Negative for chills and fever.  HENT: Negative for congestion.   Eyes: Negative for pain.  Respiratory: Positive for cough and shortness of breath.   Cardiovascular: Positive for chest pain. Negative for leg swelling.  Gastrointestinal: Negative for abdominal pain, diarrhea, nausea and vomiting.  Genitourinary: Negative for dysuria.  Musculoskeletal: Negative for myalgias.  Skin: Negative for rash.  Neurological: Negative for dizziness and headaches.    Physical Exam Updated Vital Signs BP 109/63 (BP Location: Left Arm)   Pulse 68   Temp 98.7 F (37.1 C) (Oral)   Resp 20   SpO2 100%   Physical Exam Vitals and nursing note reviewed.  Constitutional:      General: She is not in acute distress.    Appearance: She is obese.     Comments: Obese 35 year old female sitting in examination chair in no acute distress.  Pleasant, able to answer questions appropriate follow commands  HENT:     Head: Normocephalic and atraumatic.     Nose: Nose normal.  Eyes:     General: No scleral icterus. Cardiovascular:     Rate and Rhythm: Normal rate and regular rhythm.     Pulses: Normal pulses.     Heart sounds: Normal heart sounds.     Comments: No tachycardia Pulmonary:     Effort: Pulmonary effort is normal. No respiratory distress.     Breath sounds: No wheezing.     Comments: Lungs are clear to auscultation all fields.  No tachypnea, SPO2 100% on room air Abdominal:     Palpations: Abdomen is soft.     Tenderness: There is no abdominal tenderness.  Musculoskeletal:     Cervical back: Normal range of motion.     Right lower leg: No edema.     Left lower leg: No edema.  Skin:    General: Skin is warm and dry.     Capillary Refill: Capillary refill takes less than 2 seconds.  Neurological:     Mental Status: She is alert. Mental status  is at baseline.  Psychiatric:        Mood and Affect: Mood normal.        Behavior: Behavior normal.     ED Results / Procedures / Treatments   Labs (all labs ordered are listed, but only abnormal results are displayed) Labs Reviewed  BASIC METABOLIC PANEL - Abnormal; Notable for the following components:  Result Value   Potassium 3.3 (*)    All other components within normal limits  D-DIMER, QUANTITATIVE (NOT AT [facility]) - Abnormal; Notable for the following components:   D-Dimer, Quant 1.31 (*)    All other components within normal limits  CBC  I-STAT BETA HCG BLOOD, ED ([facility], [facility], [facility] ONLY)  TROPONIN I (HIGH SENSITIVITY)  TROPONIN I (HIGH SENSITIVITY)    EKG EKG Interpretation  Date/Time:  Monday May 17 2020 21:16:23 EST Ventricular Rate:  92 PR Interval:  134 QRS Duration: 82 QT Interval:  388 QTC Calculation: 479 R Axis:   74 Text Interpretation: Normal sinus rhythm Nonspecific T wave abnormality Prolonged QT Abnormal ECG When compared with ECG of 11/21/2019, QT has lengthened Confirmed by Delora Fuel ([id number]) on 05/18/2020 12:17:41 AM   Radiology DG Chest 2 View  Result Date: 05/17/2020 CLINICAL DATA:  Chest pain EXAM: CHEST - 2 VIEW COMPARISON:  11/21/2019 FINDINGS: The heart size and mediastinal contours are within normal limits. Both lungs are clear. The visualized skeletal structures are unremarkable. IMPRESSION: No active cardiopulmonary disease. Electronically Signed   By: Lucienne Capers M.D.   On: 05/17/2020 21:36   CT Angio Chest PE W and/or Wo Contrast  Result Date: 05/18/2020 CLINICAL DATA:  35 year old female with right side chest pain for 3 days. Shortness of breath. EXAM: CT ANGIOGRAPHY CHEST WITH CONTRAST TECHNIQUE: Multidetector CT imaging of the chest was performed using the standard protocol during bolus administration of intravenous contrast. Multiplanar CT image reconstructions and MIPs were obtained to evaluate the vascular anatomy. CONTRAST:   151mL OMNIPAQUE IOHEXOL 350 MG/ML SOLN COMPARISON:  Chest radiographs last night.  CTA chest 03/31/2019. FINDINGS: Cardiovascular: Good contrast bolus timing in the pulmonary arterial tree. Bilateral pulmonary arteries appear stable since 2020. No focal filling defect identified in the pulmonary arteries to suggest acute pulmonary embolism. No cardiomegaly or pericardial effusion. Negative visible aorta. No calcified coronary artery atherosclerosis is evident. Mediastinum/Nodes: Stable, negative. Lungs/Pleura: Major airways remain patent. Mildly improved lung volumes from the prior CTA. Both lungs appear clear; minimal scarring or atelectasis in the left costophrenic angle. Upper Abdomen: Negative visible liver, spleen, pancreas, adrenal glands, kidneys and bowel in the upper abdomen. Musculoskeletal: Negative. Review of the MIP images confirms the above findings. IMPRESSION: Negative for acute pulmonary embolus.  Negative CTA Chest. Electronically Signed   By: Genevie Ann M.D.   On: 05/18/2020 05:21    Procedures Procedures   Medications Ordered in ED Medications  iohexol (OMNIPAQUE) 350 MG/ML injection 100 mL (100 mLs Intravenous Contrast Given 05/18/20 0447)    ED Course  I have reviewed the triage vital signs and the nursing notes.  Pertinent labs & imaging results that were available during my care of the patient were reviewed by me and considered in my medical decision making (see chart for details).    MDM Rules/Calculators/A&P                          Patient has shortness of breath and chest pain that began yesterday is nonradiating pleuritic and right-sided nonexertional.  She is well-appearing on my physical exam she initially presented with heart rate in the 90s and given her symptoms D-dimer was ordered in triage.  This was elevated and CT PE study was obtained.  I followed up on these lab studies although they were not ordered by myself.  PE: Patient is well-appearing no tachypnea  hypoxia or tachycardia.  No increased work  of breathing.  Chest x-ray is clear with no infiltrates or abnormalities no pneumothorax.  CT PE study is negative for any pulmonary embolism and there is no significant abnormalities of the lung parenchyma.  There is some atelectasis present.  CBC without leukocytosis or anemia. BMP with very mild hypokalemia 3.3 no indication for repletion.  I-STAT hCG negative for pregnancy.  Troponin x2 within normal limits.  EKG mild QTC prolongation patient states that she has been seen by cardiology for similar.  She will follow up with both cardiology and pulmonology as well as her primary care doctor.  This patient was recently diagnosed with Covid I very strong suspicion that her symptoms today may be related to COVID-19 this may be long Covid however may also be anxiety related to just simple upper respiratory tract viral illness.  She states that she has had extensive work-ups in the past with similar symptoms and seems to been diagnosed with viral illnesses in the past as well.  Patient is well-appearing at this time.  No indication for further work-up or evaluation.  Will discharge with albuterol inhaler and follow-up instructions.  LILLIANNA EPHRAIM was evaluated in Emergency Department on 05/18/2020 for the symptoms described in the history of present illness. She was evaluated in the context of the global COVID-19 pandemic, which necessitated consideration that the patient might be at risk for infection with the SARS-CoV-2 virus that causes COVID-19. Institutional protocols and algorithms that pertain to the evaluation of patients at risk for COVID-19 are in a state of rapid change based on information released by regulatory bodies including the CDC and federal and state organizations. These policies and algorithms were followed during the patient's care in the ED.  Final Clinical Impression(s) / ED Diagnoses Final diagnoses:  Chest pain, unspecified type     Rx / DC Orders ED Discharge Orders         Ordered    albuterol (VENTOLIN HFA) 108 (90 Base) MCG/ACT inhaler  Every 6 hours PRN        05/18/20 0826           Tedd Sias, PA 05/18/20 0840    Lajean Saver, MD 05/18/20 [phone]

## 2020-05-18 NOTE — Discharge Instructions (Signed)
Please schedule appointment to follow-up with your primary care doctor, cardiologist and pulmonologist. Please drink plenty of water. You may always return to the ER for any new or concerning symptoms Please get vaccinated for COVID-19  Please use Tylenol or ibuprofen for pain.  You may use 600 mg ibuprofen every 6 hours or 1000 mg of Tylenol every 6 hours.  You may choose to alternate between the 2.  This would be most effective.  Not to exceed 4 g of Tylenol within 24 hours.  Not to exceed 3200 mg ibuprofen 24 hours.

## 2020-05-18 NOTE — ED Notes (Signed)
Patient called for vitals recheck x1 with no response

## 2020-05-18 NOTE — ED Notes (Signed)
Patient verbalized understanding of dc instructions, vss, ambulatory with nad.   

## 2020-07-08 DIAGNOSIS — E559 Vitamin D deficiency, unspecified: Secondary | ICD-10-CM | POA: Diagnosis not present

## 2020-07-08 DIAGNOSIS — Z0000 Encounter for general adult medical examination without abnormal findings: Secondary | ICD-10-CM | POA: Diagnosis not present

## 2020-07-08 DIAGNOSIS — M5432 Sciatica, left side: Secondary | ICD-10-CM | POA: Diagnosis not present

## 2020-08-04 DIAGNOSIS — M5442 Lumbago with sciatica, left side: Secondary | ICD-10-CM | POA: Diagnosis not present

## 2020-10-28 DIAGNOSIS — Z3201 Encounter for pregnancy test, result positive: Secondary | ICD-10-CM | POA: Diagnosis not present

## 2020-10-28 DIAGNOSIS — Z6841 Body Mass Index (BMI) 40.0 and over, adult: Secondary | ICD-10-CM | POA: Diagnosis not present

## 2020-10-28 DIAGNOSIS — N925 Other specified irregular menstruation: Secondary | ICD-10-CM | POA: Diagnosis not present

## 2020-10-28 DIAGNOSIS — Z113 Encounter for screening for infections with a predominantly sexual mode of transmission: Secondary | ICD-10-CM | POA: Diagnosis not present

## 2020-10-28 DIAGNOSIS — Z3480 Encounter for supervision of other normal pregnancy, unspecified trimester: Secondary | ICD-10-CM | POA: Diagnosis not present

## 2020-11-09 ENCOUNTER — Other Ambulatory Visit: Payer: Self-pay | Admitting: Obstetrics

## 2020-11-09 DIAGNOSIS — Z363 Encounter for antenatal screening for malformations: Secondary | ICD-10-CM

## 2020-11-09 DIAGNOSIS — D134 Benign neoplasm of liver: Secondary | ICD-10-CM

## 2020-11-25 DIAGNOSIS — Z369 Encounter for antenatal screening, unspecified: Secondary | ICD-10-CM | POA: Diagnosis not present

## 2020-11-25 DIAGNOSIS — Z3490 Encounter for supervision of normal pregnancy, unspecified, unspecified trimester: Secondary | ICD-10-CM | POA: Diagnosis not present

## 2020-11-25 DIAGNOSIS — Z3482 Encounter for supervision of other normal pregnancy, second trimester: Secondary | ICD-10-CM | POA: Diagnosis not present

## 2020-11-25 LAB — OB RESULTS CONSOLE ABO/RH: RH Type: POSITIVE

## 2020-11-25 LAB — HEPATITIS C ANTIBODY: HCV Ab: NEGATIVE

## 2020-11-25 LAB — OB RESULTS CONSOLE HIV ANTIBODY (ROUTINE TESTING): HIV: NONREACTIVE

## 2020-11-25 LAB — OB RESULTS CONSOLE GC/CHLAMYDIA
Chlamydia: NEGATIVE
Gonorrhea: NEGATIVE

## 2020-11-25 LAB — OB RESULTS CONSOLE RPR: RPR: NONREACTIVE

## 2020-11-25 LAB — OB RESULTS CONSOLE RUBELLA ANTIBODY, IGM: Rubella: IMMUNE

## 2020-11-25 LAB — OB RESULTS CONSOLE HEPATITIS B SURFACE ANTIGEN: Hepatitis B Surface Ag: NEGATIVE

## 2020-11-25 LAB — OB RESULTS CONSOLE ANTIBODY SCREEN: Antibody Screen: NEGATIVE

## 2020-11-30 ENCOUNTER — Encounter: Payer: Self-pay | Admitting: *Deleted

## 2020-12-02 ENCOUNTER — Other Ambulatory Visit: Payer: Self-pay

## 2020-12-02 ENCOUNTER — Other Ambulatory Visit: Payer: Self-pay | Admitting: Obstetrics and Gynecology

## 2020-12-02 ENCOUNTER — Ambulatory Visit: Payer: Medicaid Other | Attending: Obstetrics and Gynecology

## 2020-12-02 ENCOUNTER — Ambulatory Visit: Payer: Medicaid Other | Admitting: *Deleted

## 2020-12-02 ENCOUNTER — Ambulatory Visit (HOSPITAL_BASED_OUTPATIENT_CLINIC_OR_DEPARTMENT_OTHER): Payer: Medicaid Other | Admitting: Obstetrics and Gynecology

## 2020-12-02 ENCOUNTER — Encounter: Payer: Self-pay | Admitting: *Deleted

## 2020-12-02 VITALS — BP 119/77 | HR 91

## 2020-12-02 DIAGNOSIS — O26611 Liver and biliary tract disorders in pregnancy, first trimester: Secondary | ICD-10-CM | POA: Diagnosis not present

## 2020-12-02 DIAGNOSIS — Z363 Encounter for antenatal screening for malformations: Secondary | ICD-10-CM | POA: Diagnosis not present

## 2020-12-02 DIAGNOSIS — O99211 Obesity complicating pregnancy, first trimester: Secondary | ICD-10-CM

## 2020-12-02 DIAGNOSIS — Z3689 Encounter for other specified antenatal screening: Secondary | ICD-10-CM | POA: Diagnosis present

## 2020-12-02 DIAGNOSIS — Z3A13 13 weeks gestation of pregnancy: Secondary | ICD-10-CM | POA: Diagnosis not present

## 2020-12-02 DIAGNOSIS — O99212 Obesity complicating pregnancy, second trimester: Secondary | ICD-10-CM

## 2020-12-02 DIAGNOSIS — D135 Benign neoplasm of extrahepatic bile ducts: Secondary | ICD-10-CM | POA: Insufficient documentation

## 2020-12-02 DIAGNOSIS — D134 Benign neoplasm of liver: Secondary | ICD-10-CM | POA: Insufficient documentation

## 2020-12-02 NOTE — Progress Notes (Signed)
Maternal-Fetal Medicine   Name: Tara Bender DOB: 09-27-1985 MRN: [id number] Referring Provider: Jerelyn Charles, MD  I had the pleasure of seeing Tara Bender today at the [facility]. She is G4 P2012 at 13-weeks' gestation and is here for first-trimester ultrasound and consultation. She was accompanied by her friend and gave me permission to talk about her history and counsel her in her friend's presence.  Past medical history is significant for the diagnosis of hepatocellular adenoma in 2015 on MRI.  Patient reports that she was evaluated for "partial lung collapse" and had MRI that incidentally diagnosed liver cysts.  MRI reported a small simple cyst in the right lobe of the liver measuring 2.6 x 1.9 cm.  A smaller cyst was seen in the left liver measuring 1.3 x 1.4 cm.  Findings were consistent with a benign lesion and the differential diagnosis included hepatic adenoma or cavernous hemangioma.  The lesion was diagnosed in her second pregnancy.  Most recent MRI performed in May 2021 showed a 1.6 x 1.2 cm right liver mass, which was essentially unchanged from the previous findings.  Patient did not have abnormal liver enzymes.  She is asymptomatic and she does not have right upper quadrant.  No history of hypertension or diabetes or any other chronic medical conditions.  She has sleep apnea and intermittently uses CPAP machine.  Past surgical history: D&C, hand surgery Medications: Low-dose aspirin, prenatal vitamins, Diclegis. Allergies: No known drug allergies. Social history: Denies tobacco or drug or alcohol use in pregnancy.  Her partner is African-American (father of her second child) and he is in good health.  Both patient and her partner do not have sickle cell trait. Family history Father had cardiac surgery following congenital heart defect.  He and her sister have sickle cell trait.  No history of venous thromboembolism in the family. Obstetric history 08/2009:  Term delivery of a female infant weighing 6 pounds and 6 ounces at birth.  Her pregnancy and delivery were uncomplicated. 11/2013: Term delivery of a female infant weighing 6 pounds 9 ounces at birth.  She had "partial lung collapse" with good recovery in that pregnancy. She had 1 early spontaneous miscarriage between the 2 pregnancies. GYN history: Patient gives history of abnormal Pap smears but no cervical surgeries.  No history of breast disease.  Her previous menstrual cycles were regular. Prenatal course: Patient reports she had blood drawn for cell free fetal DNA screening.  Blood pressure today at her office is 119/77 mmHg.  Ultrasound The CRL measurement is consistent with the previously established dates and good fetal heart activity seen.  Fetal anatomical survey that could be ascertained at this gestational age appears normal.  Our concerns include: Hepatic Adenomas  -I counseled the patient on hepatic adenoma based on the finding on MRI. -Incidence of hepatic adenoma in women is around 1 in a 1,000,000 and it is slightly increased in (30-40 per million) in women who are on long-term oral contraceptives. -Most patients are asymptomatic. Some may experience right upper quadrant pain or discomfort. Elevated liver enzymes may be present.  -Malignant transformation is rare (4.2%). -Adenomas can grow in pregnancy and potentially lead to rupture and hemorrhage. Most growth occurs between 71- and 32-weeks' gestation.  -In a study of 17 pregnant women (51 pregnancies), increase in size of adenomas were reported in 25% of cases. Only one patient had increase to 76 mm that required transarterial embolization. No mortality was reported. Regression of the tumor was seen in  22% of cases (Growth of hepatocellular adenoma during pregnancy: A prospective study.  [place] 2020, 72, 119-124). -If rupture occurs, maternal mortality (44%) and fetal death (38%) are high.  -Overall, it is  observed that tumors less than 5 cm are likely to remain stable and do not lead to rupture. Pregnancy is not discouraged in women who have tumors less than 5 cm.  Our patient has been having MRI since 2015 and there has been no increase in size of the adenoma. I recommend ultrasound evaluation in this pregnancy in the second trimester.  Obstructive sleep apnea (OSA) The incidence of OSA in pregnancy is quoted between 1% and 6%. Obesity is a major contributing factor. In addition, pregnancy increases nasal congestion that aggravates OSA. Progesterone increase in pregnancy increases minute ventilation and can potentially worsen OSA. Patient consulted a sleep specialist and is intermittently using CPAP machine. I encouraged her to use it daily.  Long-term consequences of OSA include hypertension (already existent), pulmonary hypertension, cerebrovascular disease, diabetes, and anesthetic complications. In pregnancy, preeclampsia and gestational diabetes are more frequent in women with OSA. However, fetal growth restriction or stillbirth is not increased.   During labor, administration of narcotics and sedatives can cause apnea. Early placement of epidural analgesia should be considered so that emergency cesarean section need not be performed under general anesthesia. Proper positioning of the patient during labor is also to be considered.  Recommendations -Appointment was made for her to return in 6 weeks for detailed fetal anatomical survey. -Liver ultrasound in the second trimester. -Fetal growth assessments every 4 weeks till delivery.  Thank you for consultation.  If you have any questions or concerns, please contact me the [facility].  Consultation including face-to-face (more than 50%) counseling 45 minutes.

## 2020-12-12 ENCOUNTER — Inpatient Hospital Stay (HOSPITAL_COMMUNITY)
Admission: AD | Admit: 2020-12-12 | Discharge: 2020-12-12 | Disposition: A | Discharge status: Home / Self Care | Payer: Medicaid Other | Attending: Obstetrics and Gynecology | Admitting: Obstetrics and Gynecology

## 2020-12-12 ENCOUNTER — Other Ambulatory Visit: Payer: Self-pay

## 2020-12-12 ENCOUNTER — Encounter (HOSPITAL_COMMUNITY): Payer: Self-pay | Admitting: Obstetrics and Gynecology

## 2020-12-12 DIAGNOSIS — K5900 Constipation, unspecified: Secondary | ICD-10-CM

## 2020-12-12 DIAGNOSIS — E860 Dehydration: Secondary | ICD-10-CM | POA: Diagnosis not present

## 2020-12-12 DIAGNOSIS — O99212 Obesity complicating pregnancy, second trimester: Secondary | ICD-10-CM | POA: Diagnosis not present

## 2020-12-12 DIAGNOSIS — O26892 Other specified pregnancy related conditions, second trimester: Secondary | ICD-10-CM | POA: Diagnosis not present

## 2020-12-12 DIAGNOSIS — Z3A14 14 weeks gestation of pregnancy: Secondary | ICD-10-CM

## 2020-12-12 DIAGNOSIS — R109 Unspecified abdominal pain: Secondary | ICD-10-CM | POA: Diagnosis present

## 2020-12-12 DIAGNOSIS — O99282 Endocrine, nutritional and metabolic diseases complicating pregnancy, second trimester: Secondary | ICD-10-CM | POA: Diagnosis not present

## 2020-12-12 DIAGNOSIS — O99612 Diseases of the digestive system complicating pregnancy, second trimester: Secondary | ICD-10-CM

## 2020-12-12 HISTORY — DX: Other pulmonary collapse: J98.19

## 2020-12-12 HISTORY — DX: Cardiomegaly: I51.7

## 2020-12-12 HISTORY — DX: Sleep apnea, unspecified: G47.30

## 2020-12-12 HISTORY — DX: Benign neoplasm of liver: D13.4

## 2020-12-12 LAB — URINALYSIS, ROUTINE W REFLEX MICROSCOPIC
Bilirubin Urine: NEGATIVE
Glucose, UA: NEGATIVE mg/dL
Hgb urine dipstick: NEGATIVE
Ketones, ur: 20 mg/dL — AB
Nitrite: NEGATIVE
Protein, ur: NEGATIVE mg/dL
Specific Gravity, Urine: 1.012 (ref 1.005–1.030)
pH: 6 (ref 5.0–8.0)

## 2020-12-12 LAB — COMPREHENSIVE METABOLIC PANEL
ALT: 32 U/L (ref 0–44)
AST: 21 U/L (ref 15–41)
Albumin: 3 g/dL — ABNORMAL LOW (ref 3.5–5.0)
Alkaline Phosphatase: 57 U/L (ref 38–126)
Anion gap: 9 (ref 5–15)
BUN: 6 mg/dL (ref 6–20)
CO2: 21 mmol/L — ABNORMAL LOW (ref 22–32)
Calcium: 9 mg/dL (ref 8.9–10.3)
Chloride: 107 mmol/L (ref 98–111)
Creatinine, Ser: 0.59 mg/dL (ref 0.44–1.00)
GFR, Estimated: >60 mL/min (ref >60)
Glucose, Bld: 94 mg/dL (ref 70–99)
Potassium: 3.7 mmol/L (ref 3.5–5.1)
Sodium: 137 mmol/L (ref 135–145)
Total Bilirubin: 0.3 mg/dL (ref 0.3–1.2)
Total Protein: 6.5 g/dL (ref 6.5–8.1)

## 2020-12-12 LAB — CBC
HCT: 36.7 % (ref 36.0–46.0)
Hemoglobin: 11.8 g/dL — ABNORMAL LOW (ref 12.0–15.0)
MCH: 26.5 pg (ref 26.0–34.0)
MCHC: 32.2 g/dL (ref 30.0–36.0)
MCV: 82.3 fL (ref 80.0–100.0)
Platelets: 255 10*3/uL (ref 150–400)
RBC: 4.46 MIL/uL (ref 3.87–5.11)
RDW: 14.6 % (ref 11.5–15.5)
WBC: 7.3 10*3/uL (ref 4.0–10.5)
nRBC: 0 % (ref 0.0–0.2)

## 2020-12-12 LAB — WET PREP, GENITAL
Clue Cells Wet Prep HPF POC: NONE SEEN
Sperm: NONE SEEN
Trich, Wet Prep: NONE SEEN
Yeast Wet Prep HPF POC: NONE SEEN

## 2020-12-12 MED ORDER — POLYETHYLENE GLYCOL 3350 17 GM/SCOOP PO POWD: 17.0000 g | Freq: Every day | ORAL | 0 refills | Status: DC

## 2020-12-12 NOTE — Discharge Instructions (Signed)
You have constipation which is hard stools that are difficult to pass. It is important to have regular bowel movements every 1-3 days that are soft and easy to pass. Hard stools increase your risk of hemorrhoids and are very uncomfortable.   To prevent constipation you can increase the amount of fiber in your diet. Examples of foods with fiber are leafy greens, whole grain breads, oatmeal and other grains.  It is also important to drink at least eight 8oz glass of water everyday.   If you have not has a bowel movement in 4-5 days you made need to clean out your bowel.  This will have establish normal movement through your bowel.    Miralax Clean out Take 8 capfuls of miralax in 64 oz of gatorade. You can use any fluid that appeals to you (gatorade, water, juice) Continue to drink at least eight 8 oz glasses of water throughout the day You can repeat with another 8 capfuls of miralax in 64 oz of gatorade if you are not having a large amount of stools You will need to be at home and close to a bathroom for about 8 hours when you do the above as you may need to go to the bathroom frequently.   After you are cleaned out: - Start Colace100mg twice daily - Start Miralax once daily - Start a daily fiber supplement like metamucil or citrucel - You can safely use enemas in pregnancy  - if you are having diarrhea you can reduce to Colace once a day or miralax every other day or a 1/2 capful daily.                    Safe Medications in Pregnancy    Acne: Benzoyl Peroxide Salicylic Acid  Backache/Headache: Tylenol: 2 regular strength every 4 hours OR              2 Extra strength every 6 hours  Colds/Coughs/Allergies: Benadryl (alcohol free) 25 mg every 6 hours as needed Breath right strips Claritin Cepacol throat lozenges Chloraseptic throat spray Cold-Eeze- up to three times per day Cough drops, alcohol free Flonase (by prescription only) Guaifenesin Mucinex Robitussin DM (plain only,  alcohol free) Saline nasal spray/drops Sudafed (pseudoephedrine) & Actifed ** use only after [redacted] weeks gestation and if you do not have high blood pressure Tylenol Vicks Vaporub Zinc lozenges Zyrtec   Constipation: Colace Ducolax suppositories Fleet enema Glycerin suppositories Metamucil Milk of magnesia Miralax Senokot Smooth move tea  Diarrhea: Kaopectate Imodium A-D  *NO pepto Bismol  Hemorrhoids: Anusol Anusol HC Preparation H Tucks  Indigestion: Tums Maalox Mylanta Zantac  Pepcid  Insomnia: Benadryl (alcohol free) 25mg every 6 hours as needed Tylenol PM Unisom, no Gelcaps  Leg Cramps: Tums MagGel  Nausea/Vomiting:  Bonine Dramamine Emetrol Ginger extract Sea bands Meclizine  Nausea medication to take during pregnancy:  Unisom (doxylamine succinate 25 mg tablets) Take one tablet daily at bedtime. If symptoms are not adequately controlled, the dose can be increased to a maximum recommended dose of two tablets daily (1/2 tablet in the morning, 1/2 tablet mid-afternoon and one at bedtime). Vitamin B6 100mg tablets. Take one tablet twice a day (up to 200 mg per day).  Skin Rashes: Aveeno products Benadryl cream or 25mg every 6 hours as needed Calamine Lotion 1% cortisone cream  Yeast infection: Gyne-lotrimin 7 Monistat 7   **If taking multiple medications, please check labels to avoid duplicating the same active ingredients **take medication as directed on   the label ** Do not exceed 4000 mg of tylenol in 24 hours **Do not take medications that contain aspirin or ibuprofen    

## 2020-12-12 NOTE — MAU Provider Note (Signed)
History     CSN: [id number]  Arrival date and time: 12/12/20 [id number]   Event Date/Time   First Provider Initiated Contact with Patient 12/12/20 [phone]      Chief Complaint  Patient presents with   Abdominal Pain   35 y.o. [id number] '@14'$ .4 wks presenting with abdominal pressure. Sx started 2 days ago. Describes as intermittent pressure in her upper and lower abdomen and pelvis. Worse when daughter was lying on her. Denies that it's pain but rated it 3/10 for RN. Reports small BM yesterday but no BM for 1 wk prior. Has not tried anything for constipation. Denies VB or discharge. Denies urinary sx.    OB History     Gravida  4   Para  2   Term  2   Preterm      AB  1   Living  2      SAB  1   IAB      Ectopic      Multiple      Live Births  2           Past Medical History:  Diagnosis Date   Bronchitis    Bronchitis    uses inhaler prn - rarely uses   Cardiomegaly    Hand laceration    lt   Headache(784.0)    Hepatic adenoma    Lung collapse    partial   Meningitis    Obesity    Sleep apnea    SVD (spontaneous vaginal delivery)    x 1    Past Surgical History:  Procedure Laterality Date   DILATION AND EVACUATION N/A 08/16/2012   Procedure: DILATATION AND EVACUATION;  Surgeon: Delice Lesch, MD;  Location: [facility];  Service: Gynecology;  Laterality: N/A;   left hand surgery     tendon repair   WOUND EXPLORATION  03/14/2012   Procedure: WOUND EXPLORATION;  Surgeon: Tennis Must, MD;  Location: [place];  Service: Orthopedics;  Laterality: Left;  Left Hand Repair Flexor Tendon, Possible Nerve/Artery Repair , debridement of tendon    Family History  Problem Relation Age of Onset   Hearing loss Mother    Asthma Father     Social History   Tobacco Use   Smoking status: Never   Smokeless tobacco: Never  Vaping Use   Vaping Use: Never used  Substance Use Topics   Alcohol use: Yes    Comment: occ   Drug use: No     Allergies: No Known Allergies  No medications prior to admission.    Review of Systems  Constitutional:  Negative for fever.  Gastrointestinal:  Positive for abdominal pain, constipation and nausea. Negative for vomiting.  Genitourinary:  Negative for dysuria, frequency, hematuria, urgency, vaginal bleeding and vaginal discharge.  Physical Exam   Blood pressure 114/72, pulse 72, temperature 98.6 F (37 C), temperature source Oral, resp. rate 18, height '5\' 3"'$  (1.6 m), weight 114.9 kg, last menstrual period 09/01/2020, SpO2 100 %, unknown if currently breastfeeding.  Physical Exam Vitals and nursing note reviewed. Exam conducted with a chaperone present.  Constitutional:      General: She is not in acute distress.    Appearance: Normal appearance.  HENT:     Head: Normocephalic and atraumatic.  Cardiovascular:     Rate and Rhythm: Normal rate.  Pulmonary:     Effort: Pulmonary effort is normal. No respiratory distress.  Abdominal:     General: Bowel sounds are  normal. There is no distension.     Palpations: Abdomen is soft. There is no mass.     Tenderness: There is no abdominal tenderness. There is no guarding or rebound.     Hernia: No hernia is present.  Genitourinary:    Comments: VE: closed/long Musculoskeletal:        General: Normal range of motion.     Cervical back: Normal range of motion.  Skin:    General: Skin is warm and dry.  Neurological:     General: No focal deficit present.     Mental Status: She is alert and oriented to person, place, and time.  Psychiatric:        Mood and Affect: Mood normal.        Behavior: Behavior normal.  FHT 147  Results for orders placed or performed during the hospital encounter of 12/12/20 (from the past 24 hour(s))  Wet prep, genital     Status: Abnormal   Collection Time: 12/12/20  8:25 AM   Specimen: Vaginal  Result Value Ref Range   Yeast Wet Prep HPF POC NONE SEEN NONE SEEN   Trich, Wet Prep NONE SEEN NONE SEEN    Clue Cells Wet Prep HPF POC NONE SEEN NONE SEEN   WBC, Wet Prep HPF POC MODERATE (A) NONE SEEN   Sperm NONE SEEN   CBC     Status: Abnormal   Collection Time: 12/12/20  8:26 AM  Result Value Ref Range   WBC 7.3 4.0 - 10.5 K/uL   RBC 4.46 3.87 - 5.11 MIL/uL   Hemoglobin 11.8 (L) 12.0 - 15.0 g/dL   HCT 36.7 36.0 - 46.0 %   MCV 82.3 80.0 - 100.0 fL   MCH 26.5 26.0 - 34.0 pg   MCHC 32.2 30.0 - 36.0 g/dL   RDW 14.6 11.5 - 15.5 %   Platelets 255 150 - 400 K/uL   nRBC 0.0 0.0 - 0.2 %  Comprehensive metabolic panel     Status: Abnormal   Collection Time: 12/12/20  8:26 AM  Result Value Ref Range   Sodium 137 135 - 145 mmol/L   Potassium 3.7 3.5 - 5.1 mmol/L   Chloride 107 98 - 111 mmol/L   CO2 21 (L) 22 - 32 mmol/L   Glucose, Bld 94 70 - 99 mg/dL   BUN 6 6 - 20 mg/dL   Creatinine, Ser 0.59 0.44 - 1.00 mg/dL   Calcium 9.0 8.9 - 10.3 mg/dL   Total Protein 6.5 6.5 - 8.1 g/dL   Albumin 3.0 (L) 3.5 - 5.0 g/dL   AST 21 15 - 41 U/L   ALT 32 0 - 44 U/L   Alkaline Phosphatase 57 38 - 126 U/L   Total Bilirubin 0.3 0.3 - 1.2 mg/dL   GFR, Estimated >60 >60 mL/min   Anion gap 9 5 - 15  Urinalysis, Routine w reflex microscopic     Status: Abnormal   Collection Time: 12/12/20  8:45 AM  Result Value Ref Range   Color, Urine YELLOW YELLOW   APPearance HAZY (A) CLEAR   Specific Gravity, Urine 1.012 1.005 - 1.030   pH 6.0 5.0 - 8.0   Glucose, UA NEGATIVE NEGATIVE mg/dL   Hgb urine dipstick NEGATIVE NEGATIVE   Bilirubin Urine NEGATIVE NEGATIVE   Ketones, ur 20 (A) NEGATIVE mg/dL   Protein, ur NEGATIVE NEGATIVE mg/dL   Nitrite NEGATIVE NEGATIVE   Leukocytes,Ua MODERATE (A) NEGATIVE   RBC / HPF 0-5 0 - 5  RBC/hpf   WBC, UA 21-50 0 - 5 WBC/hpf   Bacteria, UA RARE (A) NONE SEEN   Squamous Epithelial / LPF 6-10 0 - 5   Mucus PRESENT    MAU Course  Procedures  MDM Prenatal records reviewed: hx of HSV, obesity, & hx benign liver lesion. Labs ordered and reviewed. UA shows dehydration. No  signs of acute abd process or threatened SAB. Suspect constipation. Plan for Miralax clean-out then daily dosing. Recommend more water (5-6 bottles daily), fruits and veggies, dietary fiber, and exercise. Stable for discharge home.   Assessment and Plan   1. [redacted] weeks gestation of pregnancy   2. Constipation, unspecified constipation type   3. Dehydration    Discharge home Follow up at [facility] as scheduled Return precautions Rx Miralax  Allergies as of 12/12/2020   No Known Allergies      Medication List     STOP taking these medications    eszopiclone 2 MG Tabs tablet Commonly known as: LUNESTA   levonorgestrel 20 MCG/24HR IUD Commonly known as: MIRENA   methocarbamol 500 MG tablet Commonly known as: ROBAXIN       TAKE these medications    albuterol 108 (90 Base) MCG/ACT inhaler Commonly known as: VENTOLIN HFA Inhale 1-2 puffs into the lungs every 6 (six) hours as needed for wheezing or shortness of breath.   aspirin EC 81 MG tablet Take 81 mg by mouth daily. Swallow whole.   DICLEGIS PO Take by mouth.   pantoprazole 20 MG tablet Commonly known as: PROTONIX Take 1 tablet (20 mg total) by mouth daily.   polyethylene glycol powder 17 GM/SCOOP powder Commonly known as: GLYCOLAX/MIRALAX Take 17 g by mouth daily.   prenatal multivitamin Tabs tablet Take 1 tablet by mouth daily at 12 noon.        Julianne Handler, CNM 12/12/2020, 11:08 AM

## 2020-12-12 NOTE — MAU Note (Signed)
Tara Bender is a 35 y.o. at 32w4dhere in MAU reporting: abdominal pressure for the past couple of days. States she has been constipated for the past week and had a small BM yesterday. Has not tried anything at home because she wasn't sure what to take. Having a little bit of right abdominal pain. No bleeding or discharge.  Onset of complaint: ongoing  Pain score: 3/10  Vitals:   12/12/20 0740  BP: 122/77  Pulse: 76  Resp: 18  Temp: 98.6 F (37 C)  SpO2: 100%     FHT:147  Lab orders placed from triage: UA, unable to give a sample at this time

## 2020-12-13 LAB — GC/CHLAMYDIA PROBE AMP (~~LOC~~) NOT AT ARMC
Chlamydia: NEGATIVE
Comment: NEGATIVE
Comment: NORMAL
Neisseria Gonorrhea: NEGATIVE

## 2020-12-22 DIAGNOSIS — Z369 Encounter for antenatal screening, unspecified: Secondary | ICD-10-CM | POA: Diagnosis not present

## 2021-01-04 ENCOUNTER — Inpatient Hospital Stay (HOSPITAL_COMMUNITY)
Admission: AD | Admit: 2021-01-04 | Discharge: 2021-01-04 | Disposition: A | Discharge status: Home / Self Care | Payer: Medicaid Other | Attending: Obstetrics and Gynecology | Admitting: Obstetrics and Gynecology

## 2021-01-04 ENCOUNTER — Other Ambulatory Visit: Payer: Self-pay

## 2021-01-04 ENCOUNTER — Encounter (HOSPITAL_COMMUNITY): Payer: Self-pay | Admitting: Obstetrics and Gynecology

## 2021-01-04 DIAGNOSIS — O26892 Other specified pregnancy related conditions, second trimester: Secondary | ICD-10-CM | POA: Insufficient documentation

## 2021-01-04 DIAGNOSIS — R42 Dizziness and giddiness: Secondary | ICD-10-CM | POA: Diagnosis not present

## 2021-01-04 DIAGNOSIS — R824 Acetonuria: Secondary | ICD-10-CM

## 2021-01-04 DIAGNOSIS — Z3A17 17 weeks gestation of pregnancy: Secondary | ICD-10-CM | POA: Insufficient documentation

## 2021-01-04 DIAGNOSIS — R748 Abnormal levels of other serum enzymes: Secondary | ICD-10-CM

## 2021-01-04 DIAGNOSIS — Z7982 Long term (current) use of aspirin: Secondary | ICD-10-CM | POA: Diagnosis not present

## 2021-01-04 DIAGNOSIS — O99891 Other specified diseases and conditions complicating pregnancy: Secondary | ICD-10-CM | POA: Diagnosis not present

## 2021-01-04 DIAGNOSIS — D134 Benign neoplasm of liver: Secondary | ICD-10-CM

## 2021-01-04 DIAGNOSIS — Z79899 Other long term (current) drug therapy: Secondary | ICD-10-CM | POA: Insufficient documentation

## 2021-01-04 LAB — URINALYSIS, ROUTINE W REFLEX MICROSCOPIC
Bilirubin Urine: NEGATIVE
Glucose, UA: NEGATIVE mg/dL
Hgb urine dipstick: NEGATIVE
Ketones, ur: 80 mg/dL — AB
Nitrite: NEGATIVE
Protein, ur: NEGATIVE mg/dL
Specific Gravity, Urine: 1.011 (ref 1.005–1.030)
WBC, UA: >50 WBC/hpf — ABNORMAL HIGH (ref 0–5)
pH: 6 (ref 5.0–8.0)

## 2021-01-04 LAB — CBC
HCT: 38.8 % (ref 36.0–46.0)
Hemoglobin: 12.8 g/dL (ref 12.0–15.0)
MCH: 26.9 pg (ref 26.0–34.0)
MCHC: 33 g/dL (ref 30.0–36.0)
MCV: 81.5 fL (ref 80.0–100.0)
Platelets: 274 10*3/uL (ref 150–400)
RBC: 4.76 MIL/uL (ref 3.87–5.11)
RDW: 14.6 % (ref 11.5–15.5)
WBC: 9.8 10*3/uL (ref 4.0–10.5)
nRBC: 0 % (ref 0.0–0.2)

## 2021-01-04 LAB — COMPREHENSIVE METABOLIC PANEL
ALT: 119 U/L — ABNORMAL HIGH (ref 0–44)
AST: 115 U/L — ABNORMAL HIGH (ref 15–41)
Albumin: 3.3 g/dL — ABNORMAL LOW (ref 3.5–5.0)
Alkaline Phosphatase: 73 U/L (ref 38–126)
Anion gap: 12 (ref 5–15)
BUN: <5 mg/dL — ABNORMAL LOW (ref 6–20)
CO2: 19 mmol/L — ABNORMAL LOW (ref 22–32)
Calcium: 9.4 mg/dL (ref 8.9–10.3)
Chloride: 104 mmol/L (ref 98–111)
Creatinine, Ser: 0.5 mg/dL (ref 0.44–1.00)
GFR, Estimated: >60 mL/min (ref >60)
Glucose, Bld: 82 mg/dL (ref 70–99)
Potassium: 3.5 mmol/L (ref 3.5–5.1)
Sodium: 135 mmol/L (ref 135–145)
Total Bilirubin: 0.7 mg/dL (ref 0.3–1.2)
Total Protein: 7.2 g/dL (ref 6.5–8.1)

## 2021-01-04 LAB — GLUCOSE, CAPILLARY: Glucose-Capillary: 80 mg/dL (ref 70–99)

## 2021-01-04 MED ORDER — LACTATED RINGERS IV BOLUS
1000.0000 mL | Freq: Once | INTRAVENOUS | Status: AC
  Administered 2021-01-04: 1000 mL via INTRAVENOUS

## 2021-01-04 MED ORDER — MECLIZINE HCL 25 MG PO TABS
25.0000 mg | ORAL_TABLET | Freq: Once | ORAL | Status: AC
  Administered 2021-01-04: 25 mg via ORAL
  Filled 2021-01-04: qty 1

## 2021-01-04 NOTE — MAU Provider Note (Signed)
History     CSN: [id number]  Arrival date and time: 01/04/21 1125   Event Date/Time   First Provider Initiated Contact with Patient 01/04/21 1229      Chief Complaint  Patient presents with   Nausea   Dizziness   HPI Tara Bender is a 35 y.o. at 11w6dwho presents to MAU with chief complaint of dizziness. Patient endorses a single episode of dizziness at 0300 today. She experienced another episode of dizziness around 0600 or 0630. She called her prenatal care provider and was told to eat something and rest. Patient states she did not eat because she has recently had difficulty having an appetite. She attempted management with resting in her car but did not experience relief. She denies chest pain, palpitations, syncope. She denies abdominal pain, vaginal bleeding, dysuria, fever.  [facility]  OB History     Gravida  4   Para  2   Term  2   Preterm      AB  1   Living  2      SAB  1   IAB      Ectopic      Multiple      Live Births  2           Past Medical History:  Diagnosis Date   Bronchitis    Bronchitis    uses inhaler prn - rarely uses   Cardiomegaly    Hand laceration    lt   Headache(784.0)    Hepatic adenoma    Lung collapse    partial   Meningitis    Obesity    Sleep apnea    SVD (spontaneous vaginal delivery)    x 1    Past Surgical History:  Procedure Laterality Date   DILATION AND EVACUATION N/A 08/16/2012   Procedure: DILATATION AND EVACUATION;  Surgeon: ADelice Lesch MD;  [facility];  Service: Gynecology;  Laterality: N/A;   left hand surgery     tendon repair   WOUND EXPLORATION  03/14/2012   Procedure: WOUND EXPLORATION;  Surgeon: KTennis Must MD;  [facility]  Service: Orthopedics;  Laterality: Left;  Left Hand Repair Flexor Tendon, Possible Nerve/Artery Repair , debridement of tendon    Family History  Problem Relation Age of Onset    Hearing loss Mother    Asthma Father     Social History   Tobacco Use   Smoking status: Never   Smokeless tobacco: Never  Vaping Use   Vaping Use: Never used  Substance Use Topics   Alcohol use: Yes    Comment: occ   Drug use: No    Allergies: No Known Allergies  Medications Prior to Admission  Medication Sig Dispense Refill Last Dose   aspirin EC 81 MG tablet Take 81 mg by mouth daily. Swallow whole.   Past Week   Doxylamine-Pyridoxine (DICLEGIS PO) Take by mouth.   Past Week   polyethylene glycol powder (GLYCOLAX/MIRALAX) 17 GM/SCOOP powder Take 17 g by mouth daily. 507 g 0 Past Week   Prenatal Vit-Fe Fumarate-FA (PRENATAL MULTIVITAMIN) TABS tablet Take 1 tablet by mouth daily at 12 noon.   Past Week   albuterol (VENTOLIN HFA) 108 (90 Base) MCG/ACT inhaler Inhale 1-2 puffs into the lungs every 6 (six) hours as needed for wheezing or shortness of breath. (Patient not taking: Reported on 12/02/2020) 6.7 g 0    pantoprazole (PROTONIX)  20 MG tablet Take 1 tablet (20 mg total) by mouth daily. (Patient not taking: Reported on 12/02/2020) 30 tablet 0     Review of Systems  Neurological:  Positive for dizziness.  All other systems reviewed and are negative. Physical Exam   Blood pressure 135/88, pulse (!) 104, temperature 98 F (36.7 C), temperature source Oral, resp. rate 20, height '5\' 4"'$  (1.626 m), weight 113.9 kg, last menstrual period 09/01/2020, SpO2 100 %, unknown if currently breastfeeding.  Physical Exam Vitals and nursing note reviewed. Exam conducted with a chaperone present.  Constitutional:      Appearance: Normal appearance.  HENT:     Mouth/Throat:     Lips: Pink.     Mouth: Mucous membranes are moist.  Cardiovascular:     Rate and Rhythm: Normal rate.     Pulses: Normal pulses.     Heart sounds: Normal heart sounds.  Pulmonary:     Effort: Pulmonary effort is normal.     Breath sounds: Normal breath sounds.  Abdominal:     Comments: Gravid  Skin:     Capillary Refill: Capillary refill takes less than 2 seconds.  Neurological:     Mental Status: She is alert and oriented to person, place, and time.  Psychiatric:        Mood and Affect: Mood normal.        Behavior: Behavior normal.        Thought Content: Thought content normal.        Judgment: Judgment normal.    MAU Course  Procedures  Orders Placed This Encounter  Procedures   Culture, OB Urine   Urinalysis, Routine w reflex microscopic Urine, Clean Catch   CBC   Comprehensive metabolic panel   Glucose, capillary   ED EKG   Discharge patient   Patient Vitals for the past 24 hrs:  BP Temp Temp src Pulse Resp SpO2 Height Weight  01/04/21 1441 -- -- -- 72 16 100 % -- --  01/04/21 1219 135/88 -- -- (!) 104 -- -- -- --  01/04/21 1217 134/89 -- -- (!) 104 -- -- -- --  01/04/21 1214 136/78 -- -- 84 -- 100 % -- --  01/04/21 1213 134/77 -- -- 76 -- -- -- --  01/04/21 1138 135/76 98 F (36.7 C) Oral 77 20 100 % '5\' 4"'$  (1.626 m) 113.9 kg   Meds ordered this encounter  Medications   meclizine (ANTIVERT) tablet 25 mg   lactated ringers bolus 1,000 mL   Results for orders placed or performed during the hospital encounter of 01/04/21 (from the past 24 hour(s))  CBC     Status: None   Collection Time: 01/04/21 11:50 AM  Result Value Ref Range   WBC 9.8 4.0 - 10.5 K/uL   RBC 4.76 3.87 - 5.11 MIL/uL   Hemoglobin 12.8 12.0 - 15.0 g/dL   HCT 38.8 36.0 - 46.0 %   MCV 81.5 80.0 - 100.0 fL   MCH 26.9 26.0 - 34.0 pg   MCHC 33.0 30.0 - 36.0 g/dL   RDW 14.6 11.5 - 15.5 %   Platelets 274 150 - 400 K/uL   nRBC 0.0 0.0 - 0.2 %  Comprehensive metabolic panel     Status: Abnormal   Collection Time: 01/04/21 11:50 AM  Result Value Ref Range   Sodium 135 135 - 145 mmol/L   Potassium 3.5 3.5 - 5.1 mmol/L   Chloride 104 98 - 111 mmol/L   CO2 19 (L)  22 - 32 mmol/L   Glucose, Bld 82 70 - 99 mg/dL   BUN <5 (L) 6 - 20 mg/dL   Creatinine, Ser 0.50 0.44 - 1.00 mg/dL   Calcium 9.4 8.9 -  10.3 mg/dL   Total Protein 7.2 6.5 - 8.1 g/dL   Albumin 3.3 (L) 3.5 - 5.0 g/dL   AST 115 (H) 15 - 41 U/L   ALT 119 (H) 0 - 44 U/L   Alkaline Phosphatase 73 38 - 126 U/L   Total Bilirubin 0.7 0.3 - 1.2 mg/dL   GFR, Estimated >60 >60 mL/min   Anion gap 12 5 - 15  Urinalysis, Routine w reflex microscopic Urine, Clean Catch     Status: Abnormal   Collection Time: 01/04/21 11:51 AM  Result Value Ref Range   Color, Urine YELLOW YELLOW   APPearance HAZY (A) CLEAR   Specific Gravity, Urine 1.011 1.005 - 1.030   pH 6.0 5.0 - 8.0   Glucose, UA NEGATIVE NEGATIVE mg/dL   Hgb urine dipstick NEGATIVE NEGATIVE   Bilirubin Urine NEGATIVE NEGATIVE   Ketones, ur 80 (A) NEGATIVE mg/dL   Protein, ur NEGATIVE NEGATIVE mg/dL   Nitrite NEGATIVE NEGATIVE   Leukocytes,Ua LARGE (A) NEGATIVE   RBC / HPF 6-10 0 - 5 RBC/hpf   WBC, UA >50 (H) 0 - 5 WBC/hpf   Bacteria, UA RARE (A) NONE SEEN   Squamous Epithelial / LPF 11-20 0 - 5   Mucus PRESENT   Glucose, capillary     Status: None   Collection Time: 01/04/21 12:27 PM  Result Value Ref Range   Glucose-Capillary 80 70 - 99 mg/dL   Assessment and Plan  --35 y.o. [id number] at [redacted]w[redacted]d --FHT 144 by Doppler --Dizziness 2/2 low PO intake --Ketonuria, s/p IV fluid bolus, declines rx for antiemetic --Normal ECG in MAU --Discharge home in stable condition  SDarlina Rumpf CNM 01/04/2021, 4:49 PM

## 2021-01-04 NOTE — MAU Note (Signed)
Woke up around 0300, was super dizzy and nauseous. When she woke up at 0630, she was still dizzy.  Called office, when they called her back told her to eat and drink something.  Did that, was still dizzy.  Took her kids to school, went on to work.  Felt dizzy, went to car to lay down, that just made it worse. Nurse called her back and they suggested to come here to get checked out, said 'something about an EKG'.

## 2021-01-06 LAB — CULTURE, OB URINE

## 2021-01-13 ENCOUNTER — Ambulatory Visit: Payer: Medicaid Other | Attending: Obstetrics and Gynecology

## 2021-01-13 ENCOUNTER — Ambulatory Visit: Payer: Medicaid Other | Admitting: *Deleted

## 2021-01-13 ENCOUNTER — Other Ambulatory Visit: Payer: Self-pay

## 2021-01-13 VITALS — BP 121/71 | HR 72

## 2021-01-13 DIAGNOSIS — Z6841 Body Mass Index (BMI) 40.0 and over, adult: Secondary | ICD-10-CM | POA: Diagnosis present

## 2021-01-13 DIAGNOSIS — E669 Obesity, unspecified: Secondary | ICD-10-CM

## 2021-01-13 DIAGNOSIS — Z3689 Encounter for other specified antenatal screening: Secondary | ICD-10-CM

## 2021-01-13 DIAGNOSIS — Z3A19 19 weeks gestation of pregnancy: Secondary | ICD-10-CM

## 2021-01-13 DIAGNOSIS — D134 Benign neoplasm of liver: Secondary | ICD-10-CM | POA: Diagnosis present

## 2021-01-13 DIAGNOSIS — O34219 Maternal care for unspecified type scar from previous cesarean delivery: Secondary | ICD-10-CM

## 2021-01-13 DIAGNOSIS — O99212 Obesity complicating pregnancy, second trimester: Secondary | ICD-10-CM | POA: Diagnosis present

## 2021-01-14 ENCOUNTER — Other Ambulatory Visit: Payer: Self-pay | Admitting: *Deleted

## 2021-01-14 DIAGNOSIS — Z6841 Body Mass Index (BMI) 40.0 and over, adult: Secondary | ICD-10-CM

## 2021-01-19 DIAGNOSIS — Z369 Encounter for antenatal screening, unspecified: Secondary | ICD-10-CM | POA: Diagnosis not present

## 2021-01-19 DIAGNOSIS — R42 Dizziness and giddiness: Secondary | ICD-10-CM | POA: Diagnosis not present

## 2021-01-24 ENCOUNTER — Other Ambulatory Visit (HOSPITAL_COMMUNITY): Payer: Self-pay | Admitting: Obstetrics

## 2021-01-24 DIAGNOSIS — D134 Benign neoplasm of liver: Secondary | ICD-10-CM

## 2021-01-28 ENCOUNTER — Other Ambulatory Visit: Payer: Self-pay

## 2021-01-28 ENCOUNTER — Ambulatory Visit (HOSPITAL_COMMUNITY)
Admission: RE | Admit: 2021-01-28 | Discharge: 2021-01-28 | Disposition: A | Discharge status: Home / Self Care | Payer: Medicaid Other | Source: Ambulatory Visit | Attending: Obstetrics | Admitting: Obstetrics

## 2021-01-28 DIAGNOSIS — D134 Benign neoplasm of liver: Secondary | ICD-10-CM | POA: Diagnosis not present

## 2021-01-31 ENCOUNTER — Telehealth: Payer: Self-pay

## 2021-01-31 NOTE — Telephone Encounter (Signed)
Tara Bender for pt requesting c/b to schedule referral rec'd 01/24/2021 by dr Jerelyn Charles (patient canceled 02/11/2021 appt on 01/20/2021).

## 2021-02-07 ENCOUNTER — Telehealth: Payer: Self-pay

## 2021-02-07 NOTE — Telephone Encounter (Signed)
sw ofc and advised pt canceled 02/11/21 appt, says she will have in the office. provider sent referral again. called pt 4x and lm, no return call yet to schedule.

## 2021-02-09 NOTE — Telephone Encounter (Signed)
Patient returned call and did not know why a second referral was sent to [place]. She is calling [facility] to see if she still needs the [place] and will call us back.   Told patient we are more than happy to schedule it, as long as it is needed and we're on the same page.

## 2021-02-11 ENCOUNTER — Ambulatory Visit: Payer: Medicaid Other

## 2021-02-16 DIAGNOSIS — Z3A24 24 weeks gestation of pregnancy: Secondary | ICD-10-CM | POA: Diagnosis not present

## 2021-02-16 DIAGNOSIS — O26842 Uterine size-date discrepancy, second trimester: Secondary | ICD-10-CM | POA: Diagnosis not present

## 2021-02-16 DIAGNOSIS — D134 Benign neoplasm of liver: Secondary | ICD-10-CM | POA: Diagnosis not present

## 2021-02-21 ENCOUNTER — Encounter (HOSPITAL_COMMUNITY): Payer: Self-pay | Admitting: Emergency Medicine

## 2021-02-21 ENCOUNTER — Other Ambulatory Visit: Payer: Self-pay

## 2021-02-21 ENCOUNTER — Emergency Department (HOSPITAL_COMMUNITY): Payer: Medicaid Other

## 2021-02-21 ENCOUNTER — Emergency Department (HOSPITAL_COMMUNITY)
Admission: EM | Admit: 2021-02-21 | Discharge: 2021-02-22 | Disposition: A | Discharge status: Home / Self Care | Payer: Medicaid Other | Attending: Emergency Medicine | Admitting: Emergency Medicine

## 2021-02-21 DIAGNOSIS — J101 Influenza due to other identified influenza virus with other respiratory manifestations: Secondary | ICD-10-CM | POA: Insufficient documentation

## 2021-02-21 DIAGNOSIS — Z20822 Contact with and (suspected) exposure to covid-19: Secondary | ICD-10-CM | POA: Insufficient documentation

## 2021-02-21 DIAGNOSIS — R0602 Shortness of breath: Secondary | ICD-10-CM | POA: Diagnosis not present

## 2021-02-21 DIAGNOSIS — O98212 Gonorrhea complicating pregnancy, second trimester: Secondary | ICD-10-CM | POA: Insufficient documentation

## 2021-02-21 DIAGNOSIS — Z7982 Long term (current) use of aspirin: Secondary | ICD-10-CM | POA: Insufficient documentation

## 2021-02-21 LAB — COMPREHENSIVE METABOLIC PANEL
ALT: 55 U/L — ABNORMAL HIGH (ref 0–44)
AST: 51 U/L — ABNORMAL HIGH (ref 15–41)
Albumin: 3 g/dL — ABNORMAL LOW (ref 3.5–5.0)
Alkaline Phosphatase: 91 U/L (ref 38–126)
Anion gap: 11 (ref 5–15)
BUN: 5 mg/dL — ABNORMAL LOW (ref 6–20)
CO2: 18 mmol/L — ABNORMAL LOW (ref 22–32)
Calcium: 8.9 mg/dL (ref 8.9–10.3)
Chloride: 105 mmol/L (ref 98–111)
Creatinine, Ser: 0.51 mg/dL (ref 0.44–1.00)
GFR, Estimated: >60 mL/min (ref >60)
Glucose, Bld: 76 mg/dL (ref 70–99)
Potassium: 3.3 mmol/L — ABNORMAL LOW (ref 3.5–5.1)
Sodium: 134 mmol/L — ABNORMAL LOW (ref 135–145)
Total Bilirubin: 0.7 mg/dL (ref 0.3–1.2)
Total Protein: 6.7 g/dL (ref 6.5–8.1)

## 2021-02-21 LAB — TROPONIN I (HIGH SENSITIVITY): Troponin I (High Sensitivity): 3 ng/L (ref <18)

## 2021-02-21 LAB — CBC WITH DIFFERENTIAL/PLATELET
Abs Immature Granulocytes: 0.07 10*3/uL (ref 0.00–0.07)
Basophils Absolute: 0 10*3/uL (ref 0.0–0.1)
Basophils Relative: 1 %
Eosinophils Absolute: 0.2 10*3/uL (ref 0.0–0.5)
Eosinophils Relative: 2 %
HCT: 34.3 % — ABNORMAL LOW (ref 36.0–46.0)
Hemoglobin: 11.3 g/dL — ABNORMAL LOW (ref 12.0–15.0)
Immature Granulocytes: 1 %
Lymphocytes Relative: 11 %
Lymphs Abs: 0.9 10*3/uL (ref 0.7–4.0)
MCH: 27.6 pg (ref 26.0–34.0)
MCHC: 32.9 g/dL (ref 30.0–36.0)
MCV: 83.7 fL (ref 80.0–100.0)
Monocytes Absolute: 1.4 10*3/uL — ABNORMAL HIGH (ref 0.1–1.0)
Monocytes Relative: 16 %
Neutro Abs: 6 10*3/uL (ref 1.7–7.7)
Neutrophils Relative %: 69 %
Platelets: 225 10*3/uL (ref 150–400)
RBC: 4.1 MIL/uL (ref 3.87–5.11)
RDW: 14.6 % (ref 11.5–15.5)
WBC: 8.6 10*3/uL (ref 4.0–10.5)
nRBC: 0 % (ref 0.0–0.2)

## 2021-02-21 LAB — BRAIN NATRIURETIC PEPTIDE: B Natriuretic Peptide: 14.8 pg/mL (ref 0.0–100.0)

## 2021-02-21 MED ORDER — LACTATED RINGERS IV BOLUS
1000.0000 mL | Freq: Once | INTRAVENOUS | Status: AC
  Administered 2021-02-22: 1000 mL via INTRAVENOUS

## 2021-02-21 NOTE — ED Triage Notes (Signed)
Patient with some shortness of breath since this morning, she states she had a cough and feels congestion. Patient is [redacted] weeks pregnant, states that she is having some back pain on the right with the shortness of breath.

## 2021-02-21 NOTE — Progress Notes (Signed)
[facility] at bedside, performed quick FHT (8 minutes) which shows moderate variability with 15x15 accels and one variable. No contractions noted on toco and patient verbally denies feeling any contractions. Patient verbally denies any bleeding, leaking of fluids, headace, blurry vision, high blood pressure, GDM, or problems with this pregnancy. Per verbal conversation with OB attending, if FHT is normal, patient is cleared from OB standpoint. This was communicated with Judson Roch, RN, patients nurse.   Wendi Maya, RN [facility]

## 2021-02-21 NOTE — Progress Notes (Signed)
[facility] received call from [facility] ED with report of patient presenting with complaints of chest pain, back pain and SOB. Patient denies any OB complaints at this time.   [facility] contacted OB attending for patient OB practice. Provided above report of patient complaints. Received verbal order to obtain "quick tracing" of fetus.   Wendi Maya, RN [facility]

## 2021-02-21 NOTE — ED Provider Notes (Addendum)
Emergency Medicine Provider Triage Evaluation Note  Tara Bender , a 35 y.o. female who is [redacted]w[redacted]d was evaluated in triage.  Pt complains of chest pain.  The patient reports a pleuritic bilateral back and chest pain, right greater than left, onset today.  No other known aggravating or alleviating factors.  She reports associated shortness of breath.  Dyspnea is worse with exertion, but is also present at rest.  She has also had some nasal congestion and cough, but states that this has been present throughout her pregnancy.  No leg pain or swelling, fever, chills, vomiting, diarrhea, syncope.  No abdominal pain, vaginal bleeding, vaginal fluid leakage, decreased fetal movement.  States that she has a history of an enlarged heart and a pneumothorax during pregnancy.  No history of PE.  No familial history of VTE.  No recent immobilization, long travel, surgery.  Review of Systems  Positive: Chest pain, back pain, shortness of breath, nasal congestion, cough Negative: Fever, chills, vomiting, diarrhea, syncope, leg pain, leg swelling  Physical Exam  BP 101/70 (BP Location: Left Arm)   Pulse (!) 111   Temp 99.9 F (37.7 C) (Oral)   Resp 18   LMP 09/01/2020   SpO2 100%  Gen:   Awake, no distress   Resp:  Mild tachypnea.  Lungs are clear to auscultation bilaterally. MSK:   Moves extremities without difficulty  Other:  Abdomen is gravid.  Nontender.  Medical Decision Making  Medically screening exam initiated at 10:21 PM.  Appropriate orders placed.  Langley Adie was informed that the remainder of the evaluation will be completed by another provider, this initial triage assessment does not replace that evaluation, and the importance of remaining in the ED until their evaluation is complete.  Spoke with Marissa, rapid OB RN, she will speak with on-call OB/GYN to let them know that the patient is here.  The patient is not appropriate for transfer to the MAU.  She will require further  work-up and evaluation in the ED.   -Contacted Melissa.  She spoke with on-call OB/GYN.  Rapid response will be coming to the ED to perform an NST.  The patient is actively being roomed.   Joanne Gavel, PA-C 02/21/21 2228    Joanne Gavel, PA-C 02/21/21 2240    Lucrezia Starch, MD 02/22/21 2337

## 2021-02-21 NOTE — ED Notes (Signed)
OB nurse at bedside

## 2021-02-21 NOTE — ED Provider Notes (Signed)
[facility] EMERGENCY DEPARTMENT Provider Note   CSN: [id number] Arrival date & time: 02/21/21  1957     History Chief Complaint  Patient presents with   Shortness of Breath   Back Pain    Tara Bender is a 35 y.o. female.  The history is provided by the patient.  Shortness of Breath Severity:  Moderate Onset quality:  Gradual Duration:  1 day Timing:  Constant Progression:  Worsening Chronicity:  New Relieved by:  Nothing Worsened by:  Exertion Associated symptoms: chest pain and cough   Associated symptoms: no abdominal pain, no fever, no hemoptysis and no vomiting   Back Pain Associated symptoms: chest pain   Associated symptoms: no abdominal pain and no fever   Patient currently [redacted] weeks pregnant presenting with shortness of breath.  She reports over the past day she has had increasing shortness of breath is worse with exertion.  She also reports bilateral chest and upper back pain.  She reports her chest hurts only with significantly deep breaths.  No fevers, but does report cough and congestion.  No new leg swelling. She reports good fetal movement, no abdominal pain/contractions and no bleeding. Reports previous history of cardiomegaly and "lung collapse " Reports she has had evaluations for these symptoms when she is pregnant and also when she is not pregnant without any clear etiology    Past Medical History:  Diagnosis Date   Bronchitis    Bronchitis    uses inhaler prn - rarely uses   Cardiomegaly    Chest pain of uncertain etiology 45/80/9983   Decreased fetal movement 12/20/2013   Decreased fetal movement determined by examination 12/20/2013   Hand laceration    lt   Headache(784.0)    Hepatic adenoma    Lung collapse    partial   Meningitis    Obesity    Shortness of breath 04/09/2019   Sleep apnea    SVD (spontaneous vaginal delivery)    x 1    Patient Active Problem List   Diagnosis Date Noted   Hepatic adenoma  01/04/2021   Cardiomegaly 04/09/2019   Normal vaginal delivery 12/21/2013    Past Surgical History:  Procedure Laterality Date   DILATION AND EVACUATION N/A 08/16/2012   Procedure: DILATATION AND EVACUATION;  Surgeon: Delice Lesch, MD;  Location: [place] [facility];  Service: Gynecology;  Laterality: N/A;   left hand surgery     tendon repair   WOUND EXPLORATION  03/14/2012   Procedure: WOUND EXPLORATION;  Surgeon: Tennis Must, MD;  Location: [place];  Service: Orthopedics;  Laterality: Left;  Left Hand Repair Flexor Tendon, Possible Nerve/Artery Repair , debridement of tendon     OB History     Gravida  4   Para  2   Term  2   Preterm      AB  1   Living  2      SAB  1   IAB      Ectopic      Multiple      Live Births  2           Family History  Problem Relation Age of Onset   Hearing loss Mother    Asthma Father     Social History   Tobacco Use   Smoking status: Never   Smokeless tobacco: Never  Vaping Use   Vaping Use: Never used  Substance Use Topics   Alcohol use: Yes  Comment: occ   Drug use: No    Home Medications Prior to Admission medications   Medication Sig Start Date End Date Taking? Authorizing Provider  oseltamivir (TAMIFLU) 75 MG capsule Take 1 capsule (75 mg total) by mouth every 12 (twelve) hours. 02/22/21  Yes Ripley Fraise, MD  albuterol (VENTOLIN HFA) 108 (90 Base) MCG/ACT inhaler Inhale 1-2 puffs into the lungs every 6 (six) hours as needed for wheezing or shortness of breath. Patient not taking: Reported on 12/02/2020 05/18/20   Tedd Sias, PA  aspirin EC 81 MG tablet Take 81 mg by mouth daily. Swallow whole.    [provider]  Doxylamine-Pyridoxine (DICLEGIS PO) Take by mouth.    [provider]  pantoprazole (PROTONIX) 20 MG tablet Take 1 tablet (20 mg total) by mouth daily. Patient not taking: Reported on 12/02/2020 11/21/19   Nuala Alpha A, PA-C  polyethylene glycol powder  (GLYCOLAX/MIRALAX) 17 GM/SCOOP powder Take 17 g by mouth daily. 12/12/20   Julianne Handler, CNM  Prenatal Vit-Fe Fumarate-FA (PRENATAL MULTIVITAMIN) TABS tablet Take 1 tablet by mouth daily at 12 noon.    [provider]    Allergies    Patient has no known allergies.  Review of Systems   Review of Systems  Constitutional:  Negative for fever.  HENT:  Positive for congestion.   Respiratory:  Positive for cough and shortness of breath. Negative for hemoptysis.   Cardiovascular:  Positive for chest pain. Negative for leg swelling.  Gastrointestinal:  Negative for abdominal pain, diarrhea and vomiting.  Genitourinary:  Negative for vaginal bleeding.  Musculoskeletal:  Positive for back pain.  Neurological:  Negative for syncope.  All other systems reviewed and are negative.  Physical Exam Updated Vital Signs BP 116/66   Pulse 94   Temp 99.9 F (37.7 C) (Oral)   Resp (!) 23   LMP 09/01/2020   SpO2 98%   Physical Exam CONSTITUTIONAL: Well developed/well nourished HEAD: Normocephalic/atraumatic EYES: EOMI/PERRL ENMT: Mucous membranes moist NECK: supple no meningeal signs SPINE/BACK:entire spine nontender, no upper back tenderness noted CV: S1/S2 noted, tachycardic LUNGS: Lungs are clear to auscultation bilaterally, no apparent distress ABDOMEN: soft, nontender,  gravid GU:no cva tenderness NEURO: Pt is awake/alert/appropriate, moves all extremitiesx4.  No facial droop.   EXTREMITIES: pulses normal/equal, full ROM, no significant lower extremity edema or calf tenderness SKIN: warm, color normal PSYCH: no abnormalities of mood noted, alert and oriented to situation  ED Results / Procedures / Treatments   Labs (all labs ordered are listed, but only abnormal results are displayed) Labs Reviewed  RESP PANEL BY RT-PCR (FLU A&B, COVID) ARPGX2 - Abnormal; Notable for the following components:      Result Value   Influenza A by PCR POSITIVE (*)    All other components  within normal limits  CBC WITH DIFFERENTIAL/PLATELET - Abnormal; Notable for the following components:   Hemoglobin 11.3 (*)    HCT 34.3 (*)    Monocytes Absolute 1.4 (*)    All other components within normal limits  COMPREHENSIVE METABOLIC PANEL - Abnormal; Notable for the following components:   Sodium 134 (*)    Potassium 3.3 (*)    CO2 18 (*)    BUN 5 (*)    Albumin 3.0 (*)    AST 51 (*)    ALT 55 (*)    All other components within normal limits  BRAIN NATRIURETIC PEPTIDE  TROPONIN I (HIGH SENSITIVITY)  TROPONIN I (HIGH SENSITIVITY)    EKG EKG  Interpretation  Date/Time:  Monday February 21 2021 22:07:48 EDT Ventricular Rate:  110 PR Interval:  120 QRS Duration: 82 QT Interval:  310 QTC Calculation: 419 R Axis:   64 Text Interpretation: Sinus tachycardia Nonspecific T wave abnormality Abnormal ECG Confirmed by Ripley Fraise [phone]) on 02/21/2021 11:08:58 PM  Radiology DG Chest Portable 1 View  Result Date: 02/21/2021 CLINICAL DATA:  Shortness of breath.  Chest pain. EXAM: PORTABLE CHEST 1 VIEW COMPARISON:  Chest radiograph dated 05/17/2020. FINDINGS: The heart size and mediastinal contours are within normal limits. Both lungs are clear. The visualized skeletal structures are unremarkable. IMPRESSION: No active disease. Electronically Signed   By: Anner Crete M.D.   On: 02/21/2021 23:33    Procedures Procedures   Medications Ordered in ED Medications  lactated ringers bolus 1,000 mL (1,000 mLs Intravenous New Bag/Given 02/22/21 0008)  sodium chloride 0.9 % bolus 1,000 mL (1,000 mLs Intravenous New Bag/Given 02/22/21 0037)    ED Course  I have reviewed the triage vital signs and the nursing notes.  Pertinent labs & imaging results that were available during my care of the patient were reviewed by me and considered in my medical decision making (see chart for details).    MDM Rules/Calculators/A&P                           Patient presents with shortness  of breath.  She is currently [redacted] weeks pregnant.  She has no acute obstetric complaints, and rapid OB nurse has been at the bedside and reports fetal monitoring is appropriate and no acute issues.  Patient reports appropriate fetal movement  Patient admits that she has had the symptoms intermittently even when she is not pregnant.  She reports multiple evaluations for PE without any findings.  She has  also had cardiology referrals. She has had up to 3 CT chest in the past that were negative for PE.  Also has had echocardiogram that did not reveal cardiomyopathy.  Work-up is pending at this time.  We will give IV fluids for tachycardia. Will follow closely 1:42 AM Work-up reveals patient has influenza A.  Overall her vitals are improving with IV fluids.  She is in no acute distress At this point I have low suspicion for acute PE We discussed the risk and benefit of starting Tamiflu.  Patient agrees to start this medication 3:12 AM Fluids are complete.  Patient ambulated without difficulty.  No hypoxia Lung sounds are clear. BP 120/73   Pulse 94   Temp 99.9 F (37.7 C) (Oral)   Resp (!) 24   LMP 09/01/2020   SpO2 100%  I discussed at length what to expect with influenza.  Also offered admission, patient declined.  She feels comfortable going home and managing this at home with Tamiflu We discussed strict ER return precautions. Patient is agreeable with plan Final Clinical Impression(s) / ED Diagnoses Final diagnoses:  Influenza A    Rx / DC Orders ED Discharge Orders          Ordered    oseltamivir (TAMIFLU) 75 MG capsule  Every 12 hours        02/22/21 0142             Ripley Fraise, MD 02/22/21 [phone]

## 2021-02-22 LAB — RESP PANEL BY RT-PCR (FLU A&B, COVID) ARPGX2
Influenza A by PCR: POSITIVE — AB
Influenza B by PCR: NEGATIVE
SARS Coronavirus 2 by RT PCR: NEGATIVE

## 2021-02-22 LAB — TROPONIN I (HIGH SENSITIVITY): Troponin I (High Sensitivity): 5 ng/L (ref <18)

## 2021-02-22 MED ORDER — OSELTAMIVIR PHOSPHATE 75 MG PO CAPS: 75.0000 mg | ORAL_CAPSULE | Freq: Two times a day (BID) | ORAL | 0 refills | Status: DC

## 2021-02-22 MED ORDER — SODIUM CHLORIDE 0.9 % IV BOLUS
1000.0000 mL | Freq: Once | INTRAVENOUS | Status: AC
  Administered 2021-02-22: 1000 mL via INTRAVENOUS

## 2021-03-16 DIAGNOSIS — Z3480 Encounter for supervision of other normal pregnancy, unspecified trimester: Secondary | ICD-10-CM | POA: Diagnosis not present

## 2021-03-16 DIAGNOSIS — Z369 Encounter for antenatal screening, unspecified: Secondary | ICD-10-CM | POA: Diagnosis not present

## 2021-03-24 DIAGNOSIS — O99213 Obesity complicating pregnancy, third trimester: Secondary | ICD-10-CM | POA: Diagnosis not present

## 2021-03-24 DIAGNOSIS — Z3A29 29 weeks gestation of pregnancy: Secondary | ICD-10-CM | POA: Diagnosis not present

## 2021-03-24 DIAGNOSIS — N390 Urinary tract infection, site not specified: Secondary | ICD-10-CM | POA: Diagnosis not present

## 2021-03-30 ENCOUNTER — Encounter (HOSPITAL_COMMUNITY): Payer: Self-pay | Admitting: Obstetrics and Gynecology

## 2021-03-30 ENCOUNTER — Other Ambulatory Visit: Payer: Self-pay

## 2021-03-30 ENCOUNTER — Inpatient Hospital Stay (HOSPITAL_COMMUNITY)
Admission: AD | Admit: 2021-03-30 | Discharge: 2021-03-30 | Disposition: A | Discharge status: Home / Self Care | Payer: Medicaid Other | Attending: Obstetrics and Gynecology | Admitting: Obstetrics and Gynecology

## 2021-03-30 DIAGNOSIS — U071 COVID-19: Secondary | ICD-10-CM | POA: Insufficient documentation

## 2021-03-30 DIAGNOSIS — Z3689 Encounter for other specified antenatal screening: Secondary | ICD-10-CM | POA: Insufficient documentation

## 2021-03-30 DIAGNOSIS — O98513 Other viral diseases complicating pregnancy, third trimester: Secondary | ICD-10-CM | POA: Insufficient documentation

## 2021-03-30 DIAGNOSIS — Z3A30 30 weeks gestation of pregnancy: Secondary | ICD-10-CM | POA: Insufficient documentation

## 2021-03-30 DIAGNOSIS — O26893 Other specified pregnancy related conditions, third trimester: Secondary | ICD-10-CM | POA: Insufficient documentation

## 2021-03-30 DIAGNOSIS — O368130 Decreased fetal movements, third trimester, not applicable or unspecified: Secondary | ICD-10-CM | POA: Diagnosis not present

## 2021-03-30 LAB — URINALYSIS, ROUTINE W REFLEX MICROSCOPIC
Glucose, UA: NEGATIVE mg/dL
Hgb urine dipstick: NEGATIVE
Ketones, ur: >80 mg/dL — AB
Nitrite: NEGATIVE
Protein, ur: NEGATIVE mg/dL
Specific Gravity, Urine: 1.02 (ref 1.005–1.030)
pH: 6.5 (ref 5.0–8.0)

## 2021-03-30 LAB — RESP PANEL BY RT-PCR (FLU A&B, COVID) ARPGX2
Influenza A by PCR: NEGATIVE
Influenza B by PCR: NEGATIVE
SARS Coronavirus 2 by RT PCR: POSITIVE — AB

## 2021-03-30 LAB — URINALYSIS, MICROSCOPIC (REFLEX)

## 2021-03-30 NOTE — MAU Note (Addendum)
...  Tara Bender is a 35 y.o. at [redacted]w[redacted]d here in MAU reporting: Cough and congestion since Sunday and DFM as of today. Pt states they have felt FM since coming to the hospital. Pt reports pain in Right mid-to-lower abdomen that is associated with coughing. Denies VB or discharge.  Pain score: 3/10 There were no vitals filed for this visit.   [id number]

## 2021-03-30 NOTE — MAU Note (Signed)
Patient brought her daughter with her to MAU. Patient states she has no one who can pick her up outside of her father. Patient states her child's father works in a warehouse and it is hard to reach him. Patient states she is actively trying.

## 2021-03-30 NOTE — MAU Note (Signed)
RN at bedside. Continual fetal movement audible on FHR monitor. Patient denies feeling any.

## 2021-03-30 NOTE — Discharge Instructions (Signed)

## 2021-03-30 NOTE — MAU Note (Signed)
Patients friend in room to pick up patients daughter.

## 2021-03-30 NOTE — MAU Provider Note (Signed)
History     CSN: [id number]  Arrival date and time: 03/30/21 1632   Event Date/Time   First Provider Initiated Contact with Patient 03/30/21 1727      Chief Complaint  Patient presents with   Decreased Fetal Movement   Cough   Nasal Congestion   Abdominal Pain   35 y.o. [id number] @30 .0 wks presenting with cough, congestion and decreased FM. Reports onset of cough and congestion 2 days ago. Cough is productive with green mucous. Denies SOB or CP. Denies fever, chills, body aches. 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 Reports less FM today. No other pregnancy concerns.   OB History     Gravida  4   Para  2   Term  2   Preterm      AB  1   Living  2      SAB  1   IAB      Ectopic      Multiple      Live Births  2           Past Medical History:  Diagnosis Date   Bronchitis    Bronchitis    uses inhaler prn - rarely uses   Cardiomegaly    Chest pain of uncertain etiology 10/62/6948   Decreased fetal movement 12/20/2013   Decreased fetal movement determined by examination 12/20/2013   Hand laceration    lt   Headache(784.0)    Hepatic adenoma    Lung collapse    partial   Meningitis    Obesity    Shortness of breath 04/09/2019   Sleep apnea    SVD (spontaneous vaginal delivery)    x 1    Past Surgical History:  Procedure Laterality Date   DILATION AND EVACUATION N/A 08/16/2012   Procedure: DILATATION AND EVACUATION;  Surgeon: Delice Lesch, MD;  Location: [facility];  Service: Gynecology;  Laterality: N/A;   left hand surgery     tendon repair   WOUND EXPLORATION  03/14/2012   Procedure: WOUND EXPLORATION;  Surgeon: Tennis Must, MD;  Location: [place];  Service: Orthopedics;  Laterality: Left;  Left Hand Repair Flexor Tendon, Possible Nerve/Artery Repair , debridement of tendon    Family History  Problem Relation Age of Onset   Hearing loss Mother    Asthma Father     Social History   Tobacco Use   Smoking  status: Never   Smokeless tobacco: Never  Vaping Use   Vaping Use: Never used  Substance Use Topics   Alcohol use: Not Currently    Comment: occ   Drug use: No    Allergies: No Known Allergies  Medications Prior to Admission  Medication Sig Dispense Refill Last Dose   albuterol (VENTOLIN HFA) 108 (90 Base) MCG/ACT inhaler Inhale 1-2 puffs into the lungs every 6 (six) hours as needed for wheezing or shortness of breath. 6.7 g 0 Past Month   aspirin EC 81 MG tablet Take 81 mg by mouth daily. Swallow whole.   03/30/2021   Doxylamine-Pyridoxine (DICLEGIS PO) Take by mouth.   Past Month   oseltamivir (TAMIFLU) 75 MG capsule Take 1 capsule (75 mg total) by mouth every 12 (twelve) hours. 10 capsule 0 Past Month   pantoprazole (PROTONIX) 20 MG tablet Take 1 tablet (20 mg total) by mouth daily. 30 tablet 0 Past Month   polyethylene glycol powder (GLYCOLAX/MIRALAX) 17 GM/SCOOP powder Take 17 g by mouth daily. 507 g 0  Past Month   Prenatal Vit-Fe Fumarate-FA (PRENATAL MULTIVITAMIN) TABS tablet Take 1 tablet by mouth daily at 12 noon.   Past Month    Review of Systems  Constitutional:  Negative for chills and fever.  HENT:  Positive for congestion and voice change. Negative for ear pain, sinus pressure and sore throat.   Respiratory:  Positive for cough. Negative for shortness of breath.   Cardiovascular:  Negative for chest pain.  Gastrointestinal:  Negative for abdominal pain.  Genitourinary:  Negative for vaginal bleeding.  Musculoskeletal:  Negative for arthralgias and myalgias.  Physical Exam   Blood pressure 125/69, pulse 66, temperature 98 F (36.7 C), temperature source Oral, resp. rate 16, weight 110.5 kg, last menstrual period 09/01/2020, SpO2 100 %, unknown if currently breastfeeding.  Physical Exam Vitals and nursing note reviewed.  Constitutional:      General: She is not in acute distress.    Appearance: Normal appearance.  HENT:     Head: Normocephalic and atraumatic.      Nose: Nose normal. No mucosal edema or rhinorrhea.     Mouth/Throat:     Lips: Pink.     Mouth: Mucous membranes are moist.     Pharynx: Oropharynx is clear. Uvula midline.     Tonsils: No tonsillar exudate or tonsillar abscesses.  Cardiovascular:     Rate and Rhythm: Normal rate and regular rhythm.     Heart sounds: Normal heart sounds.  Pulmonary:     Effort: Pulmonary effort is normal. No respiratory distress.     Breath sounds: Normal breath sounds. No stridor. No wheezing, rhonchi or rales.  Musculoskeletal:        General: Normal range of motion.     Cervical back: Normal range of motion.  Skin:    General: Skin is warm and dry.  Neurological:     General: No focal deficit present.     Mental Status: She is alert and oriented to person, place, and time.  Psychiatric:        Mood and Affect: Mood normal.        Behavior: Behavior normal.  EFM: 125 bpm, mod variability, + accels, no decels Toco: none  Results for orders placed or performed during the hospital encounter of 03/30/21 (from the past 24 hour(s))  Urinalysis, Routine w reflex microscopic Urine, Clean Catch     Status: Abnormal   Collection Time: 03/30/21  4:49 PM  Result Value Ref Range   Color, Urine YELLOW YELLOW   APPearance CLEAR CLEAR   Specific Gravity, Urine 1.020 1.005 - 1.030   pH 6.5 5.0 - 8.0   Glucose, UA NEGATIVE NEGATIVE mg/dL   Hgb urine dipstick NEGATIVE NEGATIVE   Bilirubin Urine SMALL (A) NEGATIVE   Ketones, ur >80 (A) NEGATIVE mg/dL   Protein, ur NEGATIVE NEGATIVE mg/dL   Nitrite NEGATIVE NEGATIVE   Leukocytes,Ua TRACE (A) NEGATIVE  Urinalysis, Microscopic (reflex)     Status: Abnormal   Collection Time: 03/30/21  4:49 PM  Result Value Ref Range   RBC / HPF 0-5 0 - 5 RBC/hpf   WBC, UA 0-5 0 - 5 WBC/hpf   Bacteria, UA MANY (A) NONE SEEN   Squamous Epithelial / LPF 6-10 0 - 5   Mucus PRESENT   Resp Panel by RT-PCR (Flu A&B, Covid) Nasopharyngeal Swab     Status: Abnormal   Collection  Time: 03/30/21  4:50 PM   Specimen: Nasopharyngeal Swab; Nasopharyngeal(NP) swabs in vial transport medium  Result Value  Ref Range   SARS Coronavirus 2 by RT PCR POSITIVE (A) NEGATIVE   Influenza A by PCR NEGATIVE NEGATIVE   Influenza B by PCR NEGATIVE NEGATIVE   MAU Course  Procedures  MDM Labs ordered and reviewed. Feeling good FM since EFM applied, NST reactive. +Covid, discussed symptom mngt, quarantine, and return precautions. UA with many bacteria, culture sent. Stable for discharge home.   Assessment and Plan   1. [redacted] weeks gestation of pregnancy   2. NST (non-stress test) reactive   3. COVID-19 affecting pregnancy in third trimester    Discharge home [facility] as scheduled Return precautions FMCs  Allergies as of 03/30/2021   No Known Allergies      Medication List     STOP taking these medications    oseltamivir 75 MG capsule Commonly known as: TAMIFLU       TAKE these medications    albuterol 108 (90 Base) MCG/ACT inhaler Commonly known as: VENTOLIN HFA Inhale 1-2 puffs into the lungs every 6 (six) hours as needed for wheezing or shortness of breath.   aspirin EC 81 MG tablet Take 81 mg by mouth daily. Swallow whole.   DICLEGIS PO Take by mouth.   pantoprazole 20 MG tablet Commonly known as: PROTONIX Take 1 tablet (20 mg total) by mouth daily.   polyethylene glycol powder 17 GM/SCOOP powder Commonly known as: GLYCOLAX/MIRALAX Take 17 g by mouth daily.   prenatal multivitamin Tabs tablet Take 1 tablet by mouth daily at 12 noon.       Julianne Handler, CNM 03/30/2021, 6:23 PM

## 2021-03-31 LAB — CULTURE, OB URINE

## 2021-04-13 DIAGNOSIS — Z369 Encounter for antenatal screening, unspecified: Secondary | ICD-10-CM | POA: Diagnosis not present

## 2021-04-19 ENCOUNTER — Encounter (HOSPITAL_COMMUNITY): Payer: Self-pay | Admitting: Obstetrics and Gynecology

## 2021-04-19 ENCOUNTER — Other Ambulatory Visit: Payer: Self-pay

## 2021-04-19 ENCOUNTER — Inpatient Hospital Stay (HOSPITAL_COMMUNITY)
Admission: AD | Admit: 2021-04-19 | Discharge: 2021-04-19 | Disposition: A | Discharge status: Home / Self Care | Payer: Medicaid Other | Attending: Obstetrics and Gynecology | Admitting: Obstetrics and Gynecology

## 2021-04-19 ENCOUNTER — Inpatient Hospital Stay: Payer: Medicaid Other

## 2021-04-19 ENCOUNTER — Inpatient Hospital Stay (HOSPITAL_BASED_OUTPATIENT_CLINIC_OR_DEPARTMENT_OTHER): Payer: Medicaid Other

## 2021-04-19 DIAGNOSIS — Z3493 Encounter for supervision of normal pregnancy, unspecified, third trimester: Secondary | ICD-10-CM

## 2021-04-19 DIAGNOSIS — Z3689 Encounter for other specified antenatal screening: Secondary | ICD-10-CM | POA: Diagnosis not present

## 2021-04-19 DIAGNOSIS — O368130 Decreased fetal movements, third trimester, not applicable or unspecified: Secondary | ICD-10-CM | POA: Insufficient documentation

## 2021-04-19 DIAGNOSIS — Z3A32 32 weeks gestation of pregnancy: Secondary | ICD-10-CM | POA: Diagnosis not present

## 2021-04-19 NOTE — MAU Note (Signed)
Presents with c/o decreased FM, reports has felt movement but less than usual.  Denies VB or LOF.

## 2021-04-19 NOTE — MAU Provider Note (Signed)
History     CSN: [id number]  Arrival date and time: 04/19/21 1004   Event Date/Time   First Provider Initiated Contact with Patient 04/19/21 1035      Chief Complaint  Patient presents with   Decreased Fetal Movement   HPI Tara Bender is a 35 y.o. [id number] at [redacted]w[redacted]d who presents with decreased fetal movement. She states the baby hasn't been moving normally for the last 2 days. She reports she is feeling movement but not as much. She denies any pain or bleeding. Denies any leaking of fluid.  OB History     Gravida  4   Para  2   Term  2   Preterm      AB  1   Living  2      SAB  1   IAB      Ectopic      Multiple      Live Births  2           Past Medical History:  Diagnosis Date   Bronchitis    Bronchitis    uses inhaler prn - rarely uses   Cardiomegaly    Chest pain of uncertain etiology 07/62/2633   Decreased fetal movement 12/20/2013   Decreased fetal movement determined by examination 12/20/2013   Hand laceration    lt   Headache(784.0)    Hepatic adenoma    Lung collapse    partial   Meningitis    Obesity    Shortness of breath 04/09/2019   Sleep apnea    SVD (spontaneous vaginal delivery)    x 1    Past Surgical History:  Procedure Laterality Date   DILATION AND EVACUATION N/A 08/16/2012   Procedure: DILATATION AND EVACUATION;  Surgeon: Delice Lesch, MD;  Location: [facility];  Service: Gynecology;  Laterality: N/A;   left hand surgery     tendon repair   WOUND EXPLORATION  03/14/2012   Procedure: WOUND EXPLORATION;  Surgeon: Tennis Must, MD;  Location: [place] [facility];  Service: Orthopedics;  Laterality: Left;  Left Hand Repair Flexor Tendon, Possible Nerve/Artery Repair , debridement of tendon    Family History  Problem Relation Age of Onset   Hearing loss Mother    Asthma Father     Social History   Tobacco Use   Smoking status: Never   Smokeless tobacco: Never  Vaping Use   Vaping Use: Never used   Substance Use Topics   Alcohol use: Not Currently    Comment: occ   Drug use: No    Allergies: No Known Allergies  Medications Prior to Admission  Medication Sig Dispense Refill Last Dose   aspirin EC 81 MG tablet Take 81 mg by mouth daily. Swallow whole.   Past Week   polyethylene glycol powder (GLYCOLAX/MIRALAX) 17 GM/SCOOP powder Take 17 g by mouth daily. 507 g 0 04/19/2021   Prenatal Vit-Fe Fumarate-FA (PRENATAL MULTIVITAMIN) TABS tablet Take 1 tablet by mouth daily at 12 noon.   Past Month   albuterol (VENTOLIN HFA) 108 (90 Base) MCG/ACT inhaler Inhale 1-2 puffs into the lungs every 6 (six) hours as needed for wheezing or shortness of breath. 6.7 g 0 More than a month   Doxylamine-Pyridoxine (DICLEGIS PO) Take by mouth.      pantoprazole (PROTONIX) 20 MG tablet Take 1 tablet (20 mg total) by mouth daily. 30 tablet 0     Review of Systems  Constitutional: Negative.  Negative for fatigue  and fever.  HENT: Negative.    Respiratory: Negative.  Negative for shortness of breath.   Cardiovascular: Negative.  Negative for chest pain.  Gastrointestinal: Negative.  Negative for abdominal pain, constipation, diarrhea, nausea and vomiting.  Genitourinary: Negative.  Negative for dysuria, vaginal bleeding and vaginal discharge.  Neurological: Negative.  Negative for dizziness and headaches.  Physical Exam   Blood pressure 121/68, pulse 99, temperature 98.4 F (36.9 C), temperature source Oral, resp. rate 20, height 5\' 5"  (1.651 m), weight 109 kg, last menstrual period 09/01/2020, SpO2 100 %, unknown if currently breastfeeding.  Physical Exam Vitals and nursing note reviewed.  Constitutional:      General: She is not in acute distress.    Appearance: She is well-developed.  HENT:     Head: Normocephalic.  Eyes:     Pupils: Pupils are equal, round, and reactive to light.  Cardiovascular:     Rate and Rhythm: Normal rate and regular rhythm.     Heart sounds: Normal heart sounds.   Pulmonary:     Effort: Pulmonary effort is normal. No respiratory distress.     Breath sounds: Normal breath sounds.  Abdominal:     General: Bowel sounds are normal. There is no distension.     Palpations: Abdomen is soft.     Tenderness: There is no abdominal tenderness.  Skin:    General: Skin is warm and dry.  Neurological:     Mental Status: She is alert and oriented to person, place, and time.  Psychiatric:        Mood and Affect: Mood normal.        Behavior: Behavior normal.        Thought Content: Thought content normal.        Judgment: Judgment normal.   Fetal Tracing:  Baseline: 120 Variability: moderate Accels: 15x15 Decels: none  Toco: none  MAU Course  Procedures  [place] MFM FETAL BPP WO NON STRESS  Result Date: 04/19/2021 ----------------------------------------------------------------------  OBSTETRICS REPORT                       (Signed Final 04/19/2021 04:21 pm) ---------------------------------------------------------------------- Patient Info  ID #:       [id number]                          D.O.B.:  Aug 20, 1985 (35 yrs)  Name:       Tara Bender              Visit Date: 04/19/2021 12:05 pm ---------------------------------------------------------------------- Performed By  Attending:        Johnell Comings MD         Ref. Address:     [place]                                                             [address]  [place], [place]  Performed By:     Rodrigo Ran BS      [facility]  Referred By:      Jerelyn Charles                    MD  ---------------------------------------------------------------------- Orders  #  Description                           Code        Ordered By  1  [place] MFM FETAL BPP WO NON               96222.97    Consuela Widener     STRESS ----------------------------------------------------------------------  #  Order #                     Accession #                Episode #  1  [id number]                   [id number]                 [id number] ---------------------------------------------------------------------- Indications  Decreased fetal movements, third trimester,    O36.8130  unspecified  [redacted] weeks gestation of pregnancy                Z3A.32 ---------------------------------------------------------------------- Vital Signs                                                 Height:        5'4" ---------------------------------------------------------------------- Fetal Evaluation  Num Of Fetuses:         1  Fetal Heart Rate(bpm):  132  Cardiac Activity:       Observed  Presentation:           Cephalic  Placenta:               Anterior  P. Cord Insertion:      Previously Visualized  Amniotic Fluid  AFI FV:      Within normal limits  AFI Sum(cm)     %Tile       Largest Pocket(cm)  15.8            57          4.6  RUQ(cm)       RLQ(cm)       LUQ(cm)        LLQ(cm)  4             4.6           4              3.2 ---------------------------------------------------------------------- Biophysical Evaluation  Amniotic F.V:   Within normal limits       F. Tone:        Observed  F. Movement:    Observed                   Score:          8/8  F. Breathing:   Observed ---------------------------------------------------------------------- OB History  Gravidity:    4         Term:   2        Prem:   0        SAB:   1  TOP:          0       Ectopic:  0        Living: 2 ---------------------------------------------------------------------- Gestational Age  LMP:           32w 6d        Date:  09/01/20                 EDD:   06/08/21  Best:           Milderd Meager 6d     Det. By:  LMP  (09/01/20)          EDD:   06/08/21 ---------------------------------------------------------------------- Anatomy  Ventricles:            Appears normal         Stomach:                Appears normal, left                                                                        sided  Heart:                 Appears normal         Kidneys:                Appear normal                         (4CH, axis, and                         situs)  Diaphragm:             Appears  normal         Bladder:                Appears normal ---------------------------------------------------------------------- Comments  This patient presented to the MAU due to decreased fetal  movements.  A biophysical profile performed today was 8 out of 8.  There was normal amniotic fluid noted on today's ultrasound  exam. ----------------------------------------------------------------------                   Johnell Comings, MD Electronically Signed Final Report   04/19/2021 04:21 pm ----------------------------------------------------------------------    MDM NST reactive  Patient reports the movement is still not normal [place] MFM OB BPP- 8/8 with normal AFI  Assessment and Plan   1. NST (non-stress test) reactive   2. Decreased fetal movement affecting management of pregnancy in third trimester, single or unspecified fetus   3. Movement of fetus present during pregnancy in third trimester   4. [redacted] weeks gestation of pregnancy    -Discharge home in stable condition -Fetal movement precautions discussed -Patient advised to follow-up with OB as scheduled for prenatal care -Patient may return to MAU as needed or if her condition were to change or worsen   Wende Mott CNM 04/19/2021, 10:36 AM

## 2021-04-19 NOTE — Discharge Instructions (Signed)

## 2021-04-22 DIAGNOSIS — Z369 Encounter for antenatal screening, unspecified: Secondary | ICD-10-CM | POA: Diagnosis not present

## 2021-04-24 NOTE — L&D Delivery Note (Signed)
Delivery Note Patient was admitted for elective induction of labor.  She received 1 dose of cytotec and then was started on pitocin.  She underwent amniotomy.  She rapidly progressed in active labor to 10 cm.  She pushed once.  At 2:47 PM a viable female was delivered via Vaginal, Spontaneous (Presentation: Right Occiput Anterior).  APGAR: 8, 9; weight 3110 gm (6lb 13.7oz) .   Placenta status: Spontaneous, Intact.  Cord: 3 vessels with the following complications: None.  Cord pH: n/a  Anesthesia: Epidural Episiotomy: None Lacerations: None Suture Repair:  n/a Est. Blood Loss (mL): 100  Mom to postpartum.  Baby to Couplet care / Skin to Skin.  [place] 06/03/2021, 3:21 PM

## 2021-04-27 DIAGNOSIS — O99213 Obesity complicating pregnancy, third trimester: Secondary | ICD-10-CM | POA: Diagnosis not present

## 2021-04-27 DIAGNOSIS — Z3A34 34 weeks gestation of pregnancy: Secondary | ICD-10-CM | POA: Diagnosis not present

## 2021-04-30 ENCOUNTER — Encounter (HOSPITAL_COMMUNITY): Payer: Self-pay | Admitting: Obstetrics and Gynecology

## 2021-04-30 ENCOUNTER — Inpatient Hospital Stay (HOSPITAL_COMMUNITY)
Admission: AD | Admit: 2021-04-30 | Discharge: 2021-04-30 | Disposition: A | Discharge status: Home / Self Care | Payer: Medicaid Other | Attending: Obstetrics and Gynecology | Admitting: Obstetrics and Gynecology

## 2021-04-30 DIAGNOSIS — B9689 Other specified bacterial agents as the cause of diseases classified elsewhere: Secondary | ICD-10-CM | POA: Diagnosis not present

## 2021-04-30 DIAGNOSIS — R300 Dysuria: Secondary | ICD-10-CM | POA: Insufficient documentation

## 2021-04-30 DIAGNOSIS — Z3689 Encounter for other specified antenatal screening: Secondary | ICD-10-CM

## 2021-04-30 DIAGNOSIS — R109 Unspecified abdominal pain: Secondary | ICD-10-CM | POA: Insufficient documentation

## 2021-04-30 DIAGNOSIS — Z3A34 34 weeks gestation of pregnancy: Secondary | ICD-10-CM | POA: Diagnosis not present

## 2021-04-30 DIAGNOSIS — O26893 Other specified pregnancy related conditions, third trimester: Secondary | ICD-10-CM | POA: Insufficient documentation

## 2021-04-30 DIAGNOSIS — Z87440 Personal history of urinary (tract) infections: Secondary | ICD-10-CM | POA: Insufficient documentation

## 2021-04-30 DIAGNOSIS — O2343 Unspecified infection of urinary tract in pregnancy, third trimester: Secondary | ICD-10-CM | POA: Insufficient documentation

## 2021-04-30 DIAGNOSIS — N390 Urinary tract infection, site not specified: Secondary | ICD-10-CM | POA: Diagnosis not present

## 2021-04-30 LAB — URINALYSIS, MICROSCOPIC (REFLEX): WBC, UA: >50 WBC/hpf (ref 0–5)

## 2021-04-30 LAB — URINALYSIS, ROUTINE W REFLEX MICROSCOPIC
Bilirubin Urine: NEGATIVE
Glucose, UA: NEGATIVE mg/dL
Ketones, ur: 40 mg/dL — AB
Nitrite: POSITIVE — AB
Protein, ur: 30 mg/dL — AB
Specific Gravity, Urine: 1.025 (ref 1.005–1.030)
pH: 6.5 (ref 5.0–8.0)

## 2021-04-30 MED ORDER — CEFADROXIL 500 MG PO CAPS
500.0000 mg | ORAL_CAPSULE | Freq: Once | ORAL | Status: AC
  Administered 2021-04-30: 500 mg via ORAL
  Filled 2021-04-30: qty 1

## 2021-04-30 MED ORDER — CEFADROXIL 500 MG PO CAPS: 500.0000 mg | ORAL_CAPSULE | Freq: Two times a day (BID) | ORAL | 0 refills | Status: DC

## 2021-04-30 NOTE — MAU Provider Note (Signed)
History     CSN: [id number]  Arrival date and time: 04/30/21 0046   Event Date/Time   First Provider Initiated Contact with Patient 04/30/21 0206      Chief Complaint  Patient presents with   Dysuria   Back Pain   Abdominal Pain   Tara Bender is a 36 y.o. [id number] at [redacted]w[redacted]d who receives care at [facility].  She presents today for Dysuria.  She states she had a UTI last month and completed the medication. She states she had no formal testing and "they just sent the medication to the pharmacy."  She states she was having dysuria and inadequate emptying with her previous incident.  She states today she woke with feelings of pressure and called her office who advised her to monitor her symptoms. She states her symptoms continued to worsen and she called the MD on call and was told to report to MAU or monitor at home.  She endorses fetal movement and contractions in the form of BH contractions.  She states she drinks about 2 bottles daily and "eat more ice than I drink water."       OB History     Gravida  4   Para  2   Term  2   Preterm      AB  1   Living  2      SAB  1   IAB      Ectopic      Multiple      Live Births  2           Past Medical History:  Diagnosis Date   Bronchitis    Bronchitis    uses inhaler prn - rarely uses   Cardiomegaly    Chest pain of uncertain etiology 93/23/5573   Decreased fetal movement 12/20/2013   Decreased fetal movement determined by examination 12/20/2013   Hand laceration    lt   Headache(784.0)    Hepatic adenoma    Lung collapse    partial   Meningitis    Obesity    Shortness of breath 04/09/2019   Sleep apnea    SVD (spontaneous vaginal delivery)    x 1    Past Surgical History:  Procedure Laterality Date   DILATION AND EVACUATION N/A 08/16/2012   Procedure: DILATATION AND EVACUATION;  Surgeon: Delice Lesch, MD;  Location: [facility];  Service: Gynecology;  Laterality: N/A;   left hand surgery      tendon repair   WOUND EXPLORATION  03/14/2012   Procedure: WOUND EXPLORATION;  Surgeon: Tennis Must, MD;  Location: [facility];  Service: Orthopedics;  Laterality: Left;  Left Hand Repair Flexor Tendon, Possible Nerve/Artery Repair , debridement of tendon    Family History  Problem Relation Age of Onset   Hearing loss Mother    Asthma Father     Social History   Tobacco Use   Smoking status: Never   Smokeless tobacco: Never  Vaping Use   Vaping Use: Never used  Substance Use Topics   Alcohol use: Not Currently    Comment: occ   Drug use: No    Allergies: No Known Allergies  Medications Prior to Admission  Medication Sig Dispense Refill Last Dose   aspirin EC 81 MG tablet Take 81 mg by mouth daily. Swallow whole.   04/29/2021   Doxylamine-Pyridoxine (DICLEGIS PO) Take by mouth.   Past Month   polyethylene glycol powder (GLYCOLAX/MIRALAX) 17 GM/SCOOP  powder Take 17 g by mouth daily. 507 g 0 04/29/2021   Prenatal Vit-Fe Fumarate-FA (PRENATAL MULTIVITAMIN) TABS tablet Take 1 tablet by mouth daily at 12 noon.   04/29/2021   albuterol (VENTOLIN HFA) 108 (90 Base) MCG/ACT inhaler Inhale 1-2 puffs into the lungs every 6 (six) hours as needed for wheezing or shortness of breath. 6.7 g 0    pantoprazole (PROTONIX) 20 MG tablet Take 1 tablet (20 mg total) by mouth daily. 30 tablet 0     Review of Systems  Gastrointestinal:  Positive for constipation (Taking miralax with relief). Negative for abdominal pain, diarrhea, nausea and vomiting.  Genitourinary:  Positive for dysuria and pelvic pain (Pressure). Negative for difficulty urinating, vaginal bleeding, vaginal discharge and vaginal pain.  Neurological:  Negative for dizziness, light-headedness and headaches.  Physical Exam   Blood pressure 114/71, pulse (!) 106, temperature 98.5 F (36.9 C), temperature source Oral, resp. rate 16, height 5\' 5"  (1.651 m), weight 109 kg, last menstrual period 09/01/2020, SpO2 99 %, unknown  if currently breastfeeding.  Physical Exam Vitals reviewed.  Constitutional:      Appearance: She is well-developed.  HENT:     Head: Normocephalic and atraumatic.  Cardiovascular:     Rate and Rhythm: Normal rate and regular rhythm.  Pulmonary:     Effort: Pulmonary effort is normal. No respiratory distress.     Breath sounds: Normal breath sounds.  Abdominal:     General: Bowel sounds are normal.     Palpations: Abdomen is soft.     Tenderness: There is abdominal tenderness in the right upper quadrant. There is no right CVA tenderness, left CVA tenderness, guarding or rebound.     Comments: Gravid, Appears LGA  Skin:    General: Skin is warm and dry.  Neurological:     Mental Status: She is alert and oriented to person, place, and time.  Psychiatric:        Mood and Affect: Mood normal.        Behavior: Behavior normal.    Fetal Assessment 120 bpm, Mod Var, -Decels, +Accels Toco: No ctx graphed  MAU Course   Results for orders placed or performed during the hospital encounter of 04/30/21 (from the past 24 hour(s))  Urinalysis, Routine w reflex microscopic Urine, Clean Catch     Status: Abnormal   Collection Time: 04/30/21  1:13 AM  Result Value Ref Range   Color, Urine YELLOW YELLOW   APPearance CLOUDY (A) CLEAR   Specific Gravity, Urine 1.025 1.005 - 1.030   pH 6.5 5.0 - 8.0   Glucose, UA NEGATIVE NEGATIVE mg/dL   Hgb urine dipstick MODERATE (A) NEGATIVE   Bilirubin Urine NEGATIVE NEGATIVE   Ketones, ur 40 (A) NEGATIVE mg/dL   Protein, ur 30 (A) NEGATIVE mg/dL   Nitrite POSITIVE (A) NEGATIVE   Leukocytes,Ua MODERATE (A) NEGATIVE  Urinalysis, Microscopic (reflex)     Status: Abnormal   Collection Time: 04/30/21  1:13 AM  Result Value Ref Range   RBC / HPF 21-50 0 - 5 RBC/hpf   WBC, UA >50 0 - 5 WBC/hpf   Bacteria, UA MANY (A) NONE SEEN   Squamous Epithelial / LPF 6-10 0 - 5   WBC Clumps PRESENT    Mucus PRESENT    No results found.  MDM PE Labs: UA,  UC EFM Antibiotic Assessment and Plan  36 year old [id number]  SIUP at 34.3 weeks Cat I FT Dysuria  -Exam performed. -UA returns significant for  nitrates, bacteria, and blood. -Patient informed of findings and diagnosis of UTI. -Informed that additional evaluation of urine necessary.  Cautioned that results may require change in antibiotic regimen. -Discussed treatment with antibiotic tonight and patient agreeable.  -Duricef 500mg  ordered. -Will send remaining doses to pharmacy. -Instructed to follow-up with primary office and check MyChart for results. -NST Reactive -Precautions Given  -Encouraged to call primary office or return to MAU if symptoms worsen or with the onset of new symptoms. -Discharged to home in stable condition.  Maryann Conners MSN, CNM 04/30/2021, 2:06 AM

## 2021-04-30 NOTE — MAU Note (Signed)
..  Tara Bender is a 36 y.o. at [redacted]w[redacted]d here in MAU reporting: possible UTI s/s, pelvic pressure, increase frequency and some dysuria while she is peeing. Pt reports having pressure in her back and bottom. Pt states she has had constipation, but this feels like she should push something out. Pt reports having Braxton hicks, but some increased tightening earlier yesterday. Pt reports having a UTI two weeks ago, but this feels different. Pt denies oliguria, VB, DFM, LOF, abnormal discharge and PIH s/s.   Onset of complaint: 9000 yesterday  Pain score: 6/10 back  Vitals:   04/30/21 0106  BP: 124/70  Pulse: 91  Resp: 16  Temp: 98.5 F (36.9 C)  SpO2: 99%     FHT:125 Lab orders placed from triage:  UA

## 2021-05-02 LAB — CULTURE, OB URINE: Culture: >=100000 — AB

## 2021-05-06 DIAGNOSIS — O99213 Obesity complicating pregnancy, third trimester: Secondary | ICD-10-CM | POA: Diagnosis not present

## 2021-05-12 DIAGNOSIS — Z3480 Encounter for supervision of other normal pregnancy, unspecified trimester: Secondary | ICD-10-CM | POA: Diagnosis not present

## 2021-05-12 DIAGNOSIS — Z369 Encounter for antenatal screening, unspecified: Secondary | ICD-10-CM | POA: Diagnosis not present

## 2021-05-12 LAB — OB RESULTS CONSOLE GBS: GBS: NEGATIVE

## 2021-05-19 DIAGNOSIS — Z6841 Body Mass Index (BMI) 40.0 and over, adult: Secondary | ICD-10-CM | POA: Diagnosis not present

## 2021-05-19 DIAGNOSIS — O99213 Obesity complicating pregnancy, third trimester: Secondary | ICD-10-CM | POA: Diagnosis not present

## 2021-05-19 DIAGNOSIS — Z3A37 37 weeks gestation of pregnancy: Secondary | ICD-10-CM | POA: Diagnosis not present

## 2021-05-20 ENCOUNTER — Telehealth (HOSPITAL_COMMUNITY): Payer: Self-pay | Admitting: *Deleted

## 2021-05-20 NOTE — Telephone Encounter (Signed)
Preadmission screen  

## 2021-05-23 ENCOUNTER — Telehealth (HOSPITAL_COMMUNITY): Payer: Self-pay | Admitting: *Deleted

## 2021-05-23 ENCOUNTER — Encounter (HOSPITAL_COMMUNITY): Payer: Self-pay | Admitting: *Deleted

## 2021-05-23 NOTE — Telephone Encounter (Signed)
Preadmission screen  

## 2021-05-27 DIAGNOSIS — Z3A38 38 weeks gestation of pregnancy: Secondary | ICD-10-CM | POA: Diagnosis not present

## 2021-05-27 DIAGNOSIS — O99213 Obesity complicating pregnancy, third trimester: Secondary | ICD-10-CM | POA: Diagnosis not present

## 2021-05-30 ENCOUNTER — Inpatient Hospital Stay (HOSPITAL_COMMUNITY)
Admission: AD | Admit: 2021-05-30 | Discharge: 2021-05-30 | Disposition: A | Discharge status: Home / Self Care | Payer: Medicaid Other | Attending: Obstetrics and Gynecology | Admitting: Obstetrics and Gynecology

## 2021-05-30 ENCOUNTER — Encounter (HOSPITAL_COMMUNITY): Payer: Self-pay | Admitting: Obstetrics and Gynecology

## 2021-05-30 ENCOUNTER — Encounter (HOSPITAL_COMMUNITY): Payer: Self-pay | Admitting: Obstetrics

## 2021-05-30 ENCOUNTER — Other Ambulatory Visit: Payer: Self-pay

## 2021-05-30 DIAGNOSIS — O26893 Other specified pregnancy related conditions, third trimester: Secondary | ICD-10-CM

## 2021-05-30 DIAGNOSIS — Z3A38 38 weeks gestation of pregnancy: Secondary | ICD-10-CM | POA: Diagnosis not present

## 2021-05-30 DIAGNOSIS — N898 Other specified noninflammatory disorders of vagina: Secondary | ICD-10-CM

## 2021-05-30 DIAGNOSIS — O368130 Decreased fetal movements, third trimester, not applicable or unspecified: Secondary | ICD-10-CM | POA: Diagnosis present

## 2021-05-30 HISTORY — DX: Unspecified abnormal cytological findings in specimens from vagina: R87.629

## 2021-05-30 HISTORY — DX: Anxiety disorder, unspecified: F41.9

## 2021-05-30 HISTORY — DX: Urinary tract infection, site not specified: N39.0

## 2021-05-30 LAB — WET PREP, GENITAL
Clue Cells Wet Prep HPF POC: NONE SEEN
Sperm: NONE SEEN
Trich, Wet Prep: NONE SEEN
WBC, Wet Prep HPF POC: >=10 — AB (ref <10)
Yeast Wet Prep HPF POC: NONE SEEN

## 2021-05-30 NOTE — MAU Note (Signed)
Presents with c/o LOF or vaginal discharge.  Reports felt wetness in perineal area.  Also states decreased FM yesterday, halt felt movement this morning.  Denies VB.  Endorses +FM.

## 2021-05-30 NOTE — MAU Provider Note (Signed)
History     CSN: [id number]  Arrival date and time: 05/30/21 0846   Event Date/Time   First Provider Initiated Contact with Patient 05/30/21 [phone]      Chief Complaint  Patient presents with   Rupture of Membranes   HPI This is a 36 year old G4 P2-0-1-2 at 103 weeks and 5 days who presents with complaints of leaking fluid and decreased fetal movement.  Started having some leaking fluid over the weekend.  She is not sure if this is vaginal discharge or rupture of membranes.  Reports no large gush of fluid.  Denies bleeding, cramping, contractions.  No abdominal pain, fevers, chills, nausea, vomiting.  OB History     Gravida  4   Para  2   Term  2   Preterm      AB  1   Living  2      SAB  1   IAB      Ectopic      Multiple      Live Births  2           Past Medical History:  Diagnosis Date   Anxiety    Bronchitis    Bronchitis    uses inhaler prn - rarely uses   Cardiomegaly    Chest pain of uncertain etiology 33/29/5188   Decreased fetal movement 12/20/2013   Decreased fetal movement determined by examination 12/20/2013   Hand laceration    lt   Headache(784.0)    Hepatic adenoma    Lung collapse    partial   Lung collapse    with 3rd preg   Meningitis    Obesity    Shortness of breath 04/09/2019   Sleep apnea    does not use CPAP   SVD (spontaneous vaginal delivery)    x 1   UTI (urinary tract infection)    Vaginal Pap smear, abnormal     Past Surgical History:  Procedure Laterality Date   DILATION AND EVACUATION N/A 08/16/2012   Procedure: DILATATION AND EVACUATION;  Surgeon: Delice Lesch, MD;  Location: [facility];  Service: Gynecology;  Laterality: N/A;   left hand surgery     tendon repair   WOUND EXPLORATION  03/14/2012   Procedure: WOUND EXPLORATION;  Surgeon: Tennis Must, MD;  Location: [place];  Service: Orthopedics;  Laterality: Left;  Left Hand Repair Flexor Tendon, Possible Nerve/Artery Repair ,  debridement of tendon    Family History  Problem Relation Age of Onset   Hearing loss Mother    Heart disease Father        heart surgery when born, ? hole in his heart   Asthma Father     Social History   Tobacco Use   Smoking status: Never   Smokeless tobacco: Never  Vaping Use   Vaping Use: Never used  Substance Use Topics   Alcohol use: Not Currently    Comment: occ   Drug use: No    Allergies: No Known Allergies  Medications Prior to Admission  Medication Sig Dispense Refill Last Dose   aspirin EC 81 MG tablet Take 81 mg by mouth daily. Swallow whole.   Past Week   Doxylamine-Pyridoxine (DICLEGIS PO) Take by mouth.   Past Month   polyethylene glycol powder (GLYCOLAX/MIRALAX) 17 GM/SCOOP powder Take 17 g by mouth daily. 507 g 0 Past Month   Prenatal Vit-Fe Fumarate-FA (PRENATAL MULTIVITAMIN) TABS tablet Take 1 tablet by mouth daily at 12  noon.   Past Week   albuterol (VENTOLIN HFA) 108 (90 Base) MCG/ACT inhaler Inhale 1-2 puffs into the lungs every 6 (six) hours as needed for wheezing or shortness of breath. 6.7 g 0 More than a month   cefadroxil (DURICEF) 500 MG capsule Take 1 capsule (500 mg total) by mouth 2 (two) times daily. 13 capsule 0    pantoprazole (PROTONIX) 20 MG tablet Take 1 tablet (20 mg total) by mouth daily. 30 tablet 0     Review of Systems Physical Exam   Blood pressure 123/74, pulse (!) 106, temperature 98.6 F (37 C), temperature source Oral, resp. rate 18, height 5\' 4"  (1.626 m), weight 109.3 kg, last menstrual period 09/01/2020, SpO2 99 %, unknown if currently breastfeeding.  Physical Exam Vitals and nursing note reviewed. Exam conducted with a chaperone present.  Constitutional:      Appearance: Normal appearance.  Cardiovascular:     Rate and Rhythm: Normal rate and regular rhythm.  Pulmonary:     Effort: Pulmonary effort is normal.     Breath sounds: Normal breath sounds.  Genitourinary:    Labia:        Right: No rash, tenderness or  lesion.        Left: No rash, tenderness or lesion.      Vagina: No signs of injury. No vaginal discharge or tenderness.     Cervix: No cervical motion tenderness, friability or erythema.     Comments: No pooling. Neurological:     Mental Status: She is alert.  Psychiatric:        Mood and Affect: Mood normal.        Behavior: Behavior normal.        Thought Content: Thought content normal.        Judgment: Judgment normal.   Negative fern  Results for orders placed or performed during the hospital encounter of 05/30/21 (from the past 24 hour(s))  Wet prep, genital     Status: Abnormal   Collection Time: 05/30/21  9:44 AM   Specimen: Vaginal; Genital  Result Value Ref Range   Yeast Wet Prep HPF POC NONE SEEN NONE SEEN   Trich, Wet Prep NONE SEEN NONE SEEN   Clue Cells Wet Prep HPF POC NONE SEEN NONE SEEN   WBC, Wet Prep HPF POC >=10 (A) <10   Sperm NONE SEEN      MAU Course  Procedures NST:  Baseline: 125  Variability: moderate Accelerations: ++  Decelerations: none Contractions: none  MDM   Assessment and Plan   1. [redacted] weeks gestation of pregnancy   2. Vaginal discharge during pregnancy in third trimester    No infection. D/c to home.  Tara Bender 05/30/2021, 9:44 AM

## 2021-06-02 ENCOUNTER — Other Ambulatory Visit: Payer: Self-pay

## 2021-06-03 ENCOUNTER — Encounter (HOSPITAL_COMMUNITY): Payer: Self-pay | Admitting: Obstetrics

## 2021-06-03 ENCOUNTER — Inpatient Hospital Stay (HOSPITAL_COMMUNITY): Payer: Medicaid Other | Admitting: Anesthesiology

## 2021-06-03 ENCOUNTER — Inpatient Hospital Stay (HOSPITAL_COMMUNITY)
Admission: AD | Admit: 2021-06-03 | Discharge: 2021-06-05 | DRG: 807 | Disposition: A | Discharge status: Home / Self Care | Payer: Medicaid Other | Attending: Obstetrics | Admitting: Obstetrics

## 2021-06-03 ENCOUNTER — Inpatient Hospital Stay (HOSPITAL_COMMUNITY): Payer: Medicaid Other

## 2021-06-03 DIAGNOSIS — O9832 Other infections with a predominantly sexual mode of transmission complicating childbirth: Secondary | ICD-10-CM | POA: Diagnosis present

## 2021-06-03 DIAGNOSIS — D134 Benign neoplasm of liver: Secondary | ICD-10-CM | POA: Diagnosis present

## 2021-06-03 DIAGNOSIS — O2662 Liver and biliary tract disorders in childbirth: Secondary | ICD-10-CM | POA: Diagnosis present

## 2021-06-03 DIAGNOSIS — Z3A39 39 weeks gestation of pregnancy: Secondary | ICD-10-CM

## 2021-06-03 DIAGNOSIS — A6000 Herpesviral infection of urogenital system, unspecified: Secondary | ICD-10-CM | POA: Diagnosis present

## 2021-06-03 DIAGNOSIS — Z20822 Contact with and (suspected) exposure to covid-19: Secondary | ICD-10-CM | POA: Diagnosis present

## 2021-06-03 DIAGNOSIS — Z3483 Encounter for supervision of other normal pregnancy, third trimester: Secondary | ICD-10-CM

## 2021-06-03 DIAGNOSIS — O26893 Other specified pregnancy related conditions, third trimester: Principal | ICD-10-CM | POA: Diagnosis present

## 2021-06-03 HISTORY — DX: Unspecified ovarian cyst, right side: N83.201

## 2021-06-03 HISTORY — DX: Benign neoplasm of liver: D13.4

## 2021-06-03 HISTORY — DX: Herpesviral infection, unspecified: B00.9

## 2021-06-03 LAB — CBC
HCT: 33.5 % — ABNORMAL LOW (ref 36.0–46.0)
Hemoglobin: 10.7 g/dL — ABNORMAL LOW (ref 12.0–15.0)
MCH: 26.4 pg (ref 26.0–34.0)
MCHC: 31.9 g/dL (ref 30.0–36.0)
MCV: 82.5 fL (ref 80.0–100.0)
Platelets: 283 10*3/uL (ref 150–400)
RBC: 4.06 MIL/uL (ref 3.87–5.11)
RDW: 14 % (ref 11.5–15.5)
WBC: 9.9 10*3/uL (ref 4.0–10.5)
nRBC: 0 % (ref 0.0–0.2)

## 2021-06-03 LAB — TYPE AND SCREEN
ABO/RH(D): AB POS
Antibody Screen: NEGATIVE

## 2021-06-03 LAB — RPR: RPR Ser Ql: NONREACTIVE

## 2021-06-03 LAB — RESP PANEL BY RT-PCR (FLU A&B, COVID) ARPGX2
Influenza A by PCR: NEGATIVE
Influenza B by PCR: NEGATIVE
SARS Coronavirus 2 by RT PCR: NEGATIVE

## 2021-06-03 MED ORDER — IBUPROFEN 600 MG PO TABS
600.0000 mg | ORAL_TABLET | Freq: Four times a day (QID) | ORAL | Status: DC
  Administered 2021-06-03 – 2021-06-05 (×8): 600 mg via ORAL
  Filled 2021-06-03 (×8): qty 1

## 2021-06-03 MED ORDER — PHENYLEPHRINE 40 MCG/ML (10ML) SYRINGE FOR IV PUSH (FOR BLOOD PRESSURE SUPPORT): 80.0000 ug | PREFILLED_SYRINGE | INTRAVENOUS | Status: DC | PRN

## 2021-06-03 MED ORDER — LIDOCAINE HCL (PF) 1 % IJ SOLN
INTRAMUSCULAR | Status: DC | PRN
  Administered 2021-06-03 (×2): 5 mL via EPIDURAL

## 2021-06-03 MED ORDER — DIPHENHYDRAMINE HCL 50 MG/ML IJ SOLN: 12.5000 mg | INTRAMUSCULAR | Status: DC | PRN

## 2021-06-03 MED ORDER — TETANUS-DIPHTH-ACELL PERTUSSIS 5-2.5-18.5 LF-MCG/0.5 IM SUSY: 0.5000 mL | PREFILLED_SYRINGE | Freq: Once | INTRAMUSCULAR | Status: DC

## 2021-06-03 MED ORDER — ONDANSETRON HCL 4 MG/2ML IJ SOLN: 4.0000 mg | Freq: Four times a day (QID) | INTRAMUSCULAR | Status: DC | PRN

## 2021-06-03 MED ORDER — WITCH HAZEL-GLYCERIN EX PADS: 1.0000 "application " | MEDICATED_PAD | CUTANEOUS | Status: DC | PRN

## 2021-06-03 MED ORDER — OXYCODONE-ACETAMINOPHEN 5-325 MG PO TABS: 1.0000 | ORAL_TABLET | ORAL | Status: DC | PRN

## 2021-06-03 MED ORDER — TERBUTALINE SULFATE 1 MG/ML IJ SOLN: 0.2500 mg | Freq: Once | INTRAMUSCULAR | Status: DC | PRN

## 2021-06-03 MED ORDER — OXYTOCIN-SODIUM CHLORIDE 30-0.9 UT/500ML-% IV SOLN
1.0000 m[IU]/min | INTRAVENOUS | Status: DC
  Administered 2021-06-03: 2 m[IU]/min via INTRAVENOUS
  Filled 2021-06-03: qty 500

## 2021-06-03 MED ORDER — ONDANSETRON HCL 4 MG/2ML IJ SOLN: 4.0000 mg | INTRAMUSCULAR | Status: DC | PRN

## 2021-06-03 MED ORDER — OXYCODONE-ACETAMINOPHEN 5-325 MG PO TABS: 2.0000 | ORAL_TABLET | ORAL | Status: DC | PRN

## 2021-06-03 MED ORDER — EPHEDRINE 5 MG/ML INJ: 10.0000 mg | INTRAVENOUS | Status: DC | PRN

## 2021-06-03 MED ORDER — DIPHENHYDRAMINE HCL 25 MG PO CAPS: 25.0000 mg | ORAL_CAPSULE | Freq: Four times a day (QID) | ORAL | Status: DC | PRN

## 2021-06-03 MED ORDER — BENZOCAINE-MENTHOL 20-0.5 % EX AERO: 1.0000 "application " | INHALATION_SPRAY | CUTANEOUS | Status: DC | PRN

## 2021-06-03 MED ORDER — PRENATAL MULTIVITAMIN CH
1.0000 | ORAL_TABLET | Freq: Every day | ORAL | Status: DC
  Administered 2021-06-04 – 2021-06-05 (×2): 1 via ORAL
  Filled 2021-06-03 (×2): qty 1

## 2021-06-03 MED ORDER — DIBUCAINE (PERIANAL) 1 % EX OINT: 1.0000 "application " | TOPICAL_OINTMENT | CUTANEOUS | Status: DC | PRN

## 2021-06-03 MED ORDER — OXYCODONE HCL 5 MG PO TABS: 5.0000 mg | ORAL_TABLET | ORAL | Status: DC | PRN

## 2021-06-03 MED ORDER — SIMETHICONE 80 MG PO CHEW: 80.0000 mg | CHEWABLE_TABLET | ORAL | Status: DC | PRN

## 2021-06-03 MED ORDER — OXYTOCIN BOLUS FROM INFUSION
333.0000 mL | Freq: Once | INTRAVENOUS | Status: AC
  Administered 2021-06-03: 333 mL via INTRAVENOUS

## 2021-06-03 MED ORDER — ONDANSETRON HCL 4 MG PO TABS: 4.0000 mg | ORAL_TABLET | ORAL | Status: DC | PRN

## 2021-06-03 MED ORDER — SENNOSIDES-DOCUSATE SODIUM 8.6-50 MG PO TABS
2.0000 | ORAL_TABLET | ORAL | Status: DC
  Administered 2021-06-03 – 2021-06-04 (×2): 2 via ORAL
  Filled 2021-06-03 (×2): qty 2

## 2021-06-03 MED ORDER — COCONUT OIL OIL: 1.0000 "application " | TOPICAL_OIL | Status: DC | PRN

## 2021-06-03 MED ORDER — MISOPROSTOL 25 MCG QUARTER TABLET
25.0000 ug | ORAL_TABLET | ORAL | Status: DC | PRN
  Administered 2021-06-03: 25 ug via VAGINAL
  Filled 2021-06-03: qty 1

## 2021-06-03 MED ORDER — OXYTOCIN-SODIUM CHLORIDE 30-0.9 UT/500ML-% IV SOLN: 2.5000 [IU]/h | INTRAVENOUS | Status: DC

## 2021-06-03 MED ORDER — SOD CITRATE-CITRIC ACID 500-334 MG/5ML PO SOLN: 30.0000 mL | ORAL | Status: DC | PRN

## 2021-06-03 MED ORDER — ACETAMINOPHEN 325 MG PO TABS
650.0000 mg | ORAL_TABLET | ORAL | Status: DC | PRN
  Administered 2021-06-03 – 2021-06-05 (×3): 650 mg via ORAL
  Filled 2021-06-03 (×3): qty 2

## 2021-06-03 MED ORDER — ACETAMINOPHEN 325 MG PO TABS: 650.0000 mg | ORAL_TABLET | ORAL | Status: DC | PRN

## 2021-06-03 MED ORDER — LACTATED RINGERS IV SOLN: 500.0000 mL | INTRAVENOUS | Status: DC | PRN

## 2021-06-03 MED ORDER — LACTATED RINGERS IV SOLN: 500.0000 mL | Freq: Once | INTRAVENOUS | Status: DC

## 2021-06-03 MED ORDER — FENTANYL CITRATE (PF) 100 MCG/2ML IJ SOLN: 50.0000 ug | INTRAMUSCULAR | Status: DC | PRN

## 2021-06-03 MED ORDER — LACTATED RINGERS IV SOLN: INTRAVENOUS | Status: DC

## 2021-06-03 MED ORDER — OXYCODONE HCL 5 MG PO TABS: 10.0000 mg | ORAL_TABLET | ORAL | Status: DC | PRN

## 2021-06-03 MED ORDER — FENTANYL-BUPIVACAINE-NACL 0.5-0.125-0.9 MG/250ML-% EP SOLN
12.0000 mL/h | EPIDURAL | Status: DC | PRN
  Administered 2021-06-03: 12 mL/h via EPIDURAL
  Filled 2021-06-03: qty 250

## 2021-06-03 MED ORDER — LIDOCAINE HCL (PF) 1 % IJ SOLN: 30.0000 mL | INTRAMUSCULAR | Status: DC | PRN

## 2021-06-03 NOTE — Lactation Note (Signed)
This note was copied from a baby's chart. Lactation Consultation Note  Patient Name: Girl Keyasia Jolliff [id number] Date: 06/03/2021 Reason for consult: L&D Initial assessment;Mother's request;Term;Breastfeeding assistance Age:34 hours  LC assisted with latching infant in cross cradle with signs of milk transfer. Infant still feeding at the end of the visit. Mom to receive further support on the floor. All questions answered at the end of the visit.   Maternal Data Has patient been taught Hand Expression?: Yes Does the patient have breastfeeding experience prior to this delivery?: Yes How long did the patient breastfeed?: 1 year breastfeeding at night. Second child, few months had milk allergy  Feeding Mother's Current Feeding Choice: Breast Milk  LATCH Score                    Lactation Tools Discussed/Used    Interventions Interventions: Breast feeding basics reviewed;Assisted with latch;Skin to skin;Breast compression;Adjust position;Education  Discharge    Consult Status Consult Status: Follow-up from L&D Date: 06/04/21 Follow-up type: In-patient    Jaykob Minichiello  [place] 06/03/2021, 3:55 PM

## 2021-06-03 NOTE — Lactation Note (Signed)
This note was copied from a baby's chart. Lactation Consultation Note Baby sleepy. Mom concerned d/t no breast changes during pregnancy and unable to hand express colostrum. LC and RN attempted to hand express colostrum and none noted at this time. Mom didn't have any milk supply issues w/her other 2 children. Encouraged mom to rest when baby is resting. Monitoring I&O, newborn feeding habits, STS and wait and how baby is acting to see if satisfied and out put. Mom BF her 1st child for 3 week d/t having to change to hypoallergenic formula, and her son for 4 months having to change to soy formula. Mom was breast and formula. BF him at night. Baby latched well w/assistance from RN. No swallows heard. Nipple round when came off of breast. Mom encouraged to feed baby 8-12 times/24 hours and with feeding cues.   Encouraged to call for assistance or questions. Lactation brochure given.  Patient Name: Tara Bender [id number] Date: 06/03/2021 Reason for consult: Initial assessment;Term Age:35 hours  Maternal Data Has patient been taught Hand Expression?: Yes Does the patient have breastfeeding experience prior to this delivery?: Yes How long did the patient breastfeed?: 3 weeks, 4 months  Feeding    LATCH Score Latch: Grasps breast easily, tongue down, lips flanged, rhythmical sucking.  Audible Swallowing: None  Type of Nipple: Everted at rest and after stimulation  Comfort (Breast/Nipple): Soft / non-tender  Hold (Positioning): Assistance needed to correctly position infant at breast and maintain latch.  LATCH Score: 7   Lactation Tools Discussed/Used    Interventions Interventions: Breast massage;Support pillows;Adjust position  Discharge    Consult Status Consult Status: Follow-up Date: 06/04/21 Follow-up type: In-patient    Theodoro Kalata 06/03/2021, 11:52 PM

## 2021-06-03 NOTE — Anesthesia Procedure Notes (Signed)
Epidural Patient location during procedure: OB Start time: 06/03/2021 9:20 AM End time: 06/03/2021 9:28 AM  Staffing Anesthesiologist: Josephine Igo, MD Performed: anesthesiologist   Preanesthetic Checklist Completed: patient identified, IV checked, site marked, risks and benefits discussed, surgical consent, monitors and equipment checked, pre-op evaluation and timeout performed  Epidural Patient position: sitting Prep: DuraPrep and site prepped and draped Patient monitoring: continuous pulse ox and blood pressure Approach: midline Location: L3-L4 Injection technique: LOR air  Needle:  Needle type: Tuohy  Needle gauge: 17 G Needle length: 9 cm and 9 Needle insertion depth: 5 cm Catheter type: closed end flexible Catheter size: 19 Gauge Catheter at skin depth: 10 cm Test dose: negative and Other  Assessment Events: blood not aspirated, injection not painful, no injection resistance, no paresthesia and negative IV test  Additional Notes Patient identified. Risks and benefits discussed including failed block, incomplete  Pain control, post dural puncture headache, nerve damage, paralysis, blood pressure Changes, nausea, vomiting, reactions to medications-both toxic and allergic and post Partum back pain. All questions were answered. Patient expressed understanding and wished to proceed. Sterile technique was used throughout procedure. Epidural site was Dressed with sterile barrier dressing. No paresthesias, signs of intravascular injection Or signs of intrathecal spread were encountered.  Patient was more comfortable after the epidural was dosed. Please see RN's note for documentation of vital signs and FHR which are stable. Reason for block:procedure for pain

## 2021-06-03 NOTE — Lactation Note (Signed)
This note was copied from a baby's chart. Lactation Consultation Note  Patient Name: Girl Kamaile Zachow [id number] Date: 06/03/2021 Age:34 hours  Attempted LC visit after delivery. RN is assisting mother at the moment. LC will come back to room at another time as possible.     Dylon Correa A Higuera Ancidey 06/03/2021, 3:05 PM

## 2021-06-03 NOTE — H&P (Signed)
36 y.o. [id number] @ [redacted]w[redacted]d presents for elective induction of labor.  Otherwise has good fetal movement and no bleeding.  Pregnancy complicated by: BMI 40:  most recent growth ultrasound at 34 weeks: Vertex 2393 gm (5#4) 50% Adenoma of liver: Diagnosed during last pregnancy in 2015.  Was followed by GI, last seen in 2020.  F/u MRI in 2021 showed a stable lesion at 1.6 cm.  Was referred to MFM who recommended a second trimester ultrasound--adenoma not seen.  .  3 Genital herpes: on valtrex prophylaxis since 36 weeks.  Asymptomatic today, no recent outbreaks   Past Medical History:  Diagnosis Date   Adenoma of liver    Anxiety    Bronchitis    uses inhaler prn - rarely uses   Cardiomegaly    Chest pain of uncertain etiology 56/43/3295   Cyst of ovary, right    Decreased fetal movement determined by examination 12/20/2013   Hand laceration    lt   Headache(784.0)    Hepatic adenoma    HSV infection    Lung collapse    partial   Lung collapse    with 3rd preg   Meningitis 2014   viral meningitis   Obesity    Shortness of breath 04/09/2019   Sleep apnea    does not use CPAP   SVD (spontaneous vaginal delivery)    x 1   UTI (urinary tract infection)    Vaginal Pap smear, abnormal     Past Surgical History:  Procedure Laterality Date   COLPOSCOPY     multiple colpo done for several different abn paps   DILATION AND EVACUATION N/A 08/16/2012   Procedure: DILATATION AND EVACUATION;  Surgeon: Delice Lesch, MD;  Location: [facility];  Service: Gynecology;  Laterality: N/A;   left hand surgery     tendon repair   WOUND EXPLORATION  03/14/2012   Procedure: WOUND EXPLORATION;  Surgeon: Tennis Must, MD;  Location: [facility];  Service: Orthopedics;  Laterality: Left;  Left Hand Repair Flexor Tendon, Possible Nerve/Artery Repair , debridement of tendon    OB History  Gravida Para Term Preterm AB Living  4 2 2   1 2   SAB IAB Ectopic Multiple Live Births  1       2     # Outcome Date GA Lbr Len/2nd Weight Sex Delivery Anes PTL Lv  4 Current           3 Term 12/20/13 [redacted]w[redacted]d 13:41 / 00:27 2977 g F Vag-Spont EPI  LIV  2 SAB 07/2012             Birth Comments: System Generated. Please review and update pregnancy details.  1 Term 08/25/09    M Vag-Spont EPI  LIV    Social History   Socioeconomic History   Marital status: Single    Spouse name: Not on file   Number of children: Not on file   Years of education: Not on file   Highest education level: Not on file  Occupational History   Not on file  Tobacco Use   Smoking status: Never   Smokeless tobacco: Never  Vaping Use   Vaping Use: Never used  Substance and Sexual Activity   Alcohol use: Not Currently    Comment: occ   Drug use: No   Sexual activity: Yes    Birth control/protection: None  Other Topics Concern   Not on file  Social History Narrative   Not  on file   Social Determinants of Health   Financial Resource Strain: Not on file  Food Insecurity: Not on file  Transportation Needs: Not on file  Physical Activity: Not on file  Stress: Not on file  Social Connections: Not on file  Intimate Partner Violence: Not on file   Patient has no known allergies.    Prenatal Transfer Tool  Maternal Diabetes: No Genetic Screening: Normal Maternal Ultrasounds/Referrals: Normal Fetal Ultrasounds or other Referrals:  Referred to Materal Fetal Medicine  Maternal Substance Abuse:  No Significant Maternal Medications:  Meds include: Other:  valtrex Significant Maternal Lab Results: Group B Strep negative  ABO, Rh: --/--/AB POS (02/10 0022) Antibody: NEG (02/10 0022) Rubella: Immune (08/04 0000) RPR: Nonreactive (08/04 0000)  HBsAg: Negative (08/04 0000)  HIV: Non-reactive (08/04 0000)  GBS: Negative/-- (01/19 0000)    Vitals:   06/03/21 0700 06/03/21 0730  BP: (!) 98/56 113/73  Pulse: 69 66  Resp:  18  Temp:  98.7 F (37.1 C)  SpO2:       General:  NAD Abdomen:  soft, gravid,  EFW 6.5-7# Ex:  1+ edema Pelvic: vulva, vagina, cervix without lesions SVE:  2/long/-3 per RN at 0630 FHTs:  110s, moderate variability, category 1 Toco:  q4 minutes   A/P   36 y.o. P1S2583 [redacted]w[redacted]d presents for elective induction of labor Pitocin started at 0630.  AROM when able Epidural upon request  FSR/ vtx/ GBS negative Anticipate SVD  [place]

## 2021-06-03 NOTE — Progress Notes (Signed)
Patient comfortable with epidural. Alerted by RN difficulty with EFM today.  Baseline been in 110s.  The external fetal monitor is frequently doubling the baseline to 220s.  She has reset and changed all of the monitors / wires without improvement.  Requests placement of FSE for improved fetal monitoring  Toco: q2-3 minutes EFM: 110s, moderate variability, + accelerations, category 1 SVE: 3/50/-2, AROM clear fluid.  FSE placed without difficulty   A/P: G2X5284 @ [redacted]w[redacted]d w EIOL Continue pitocin, still in latent labor Anticipate SVD

## 2021-06-03 NOTE — Anesthesia Preprocedure Evaluation (Signed)
Anesthesia Evaluation  Patient identified by MRN, date of birth, ID band Patient awake    Reviewed: Allergy & Precautions, Patient's Chart, lab work & pertinent test results  Airway Mallampati: II  TM Distance: >3 FB Neck ROM: Full    Dental no notable dental hx.    Pulmonary shortness of breath and with exertion, sleep apnea ,    Pulmonary exam normal breath sounds clear to auscultation       Cardiovascular negative cardio ROS Normal cardiovascular exam Rhythm:Regular     Neuro/Psych  Headaches, Anxiety    GI/Hepatic Neg liver ROS, GERD  ,  Endo/Other  Morbid obesity  Renal/GU negative Renal ROS  negative genitourinary   Musculoskeletal negative musculoskeletal ROS (+)   Abdominal (+) + obese,   Peds  Hematology  (+) Blood dyscrasia, anemia ,   Anesthesia Other Findings   Reproductive/Obstetrics (+) Pregnancy                             Anesthesia Physical Anesthesia Plan  ASA: 3  Anesthesia Plan: Epidural   Post-op Pain Management:    Induction:   PONV Risk Score and Plan:   Airway Management Planned: Natural Airway  Additional Equipment:   Intra-op Plan:   Post-operative Plan:   Informed Consent: I have reviewed the patients History and Physical, chart, labs and discussed the procedure including the risks, benefits and alternatives for the proposed anesthesia with the patient or authorized representative who has indicated his/her understanding and acceptance.       Plan Discussed with: Anesthesiologist  Anesthesia Plan Comments:         Anesthesia Quick Evaluation

## 2021-06-04 LAB — CBC
HCT: 31.3 % — ABNORMAL LOW (ref 36.0–46.0)
Hemoglobin: 10 g/dL — ABNORMAL LOW (ref 12.0–15.0)
MCH: 26.7 pg (ref 26.0–34.0)
MCHC: 31.9 g/dL (ref 30.0–36.0)
MCV: 83.5 fL (ref 80.0–100.0)
Platelets: 228 10*3/uL (ref 150–400)
RBC: 3.75 MIL/uL — ABNORMAL LOW (ref 3.87–5.11)
RDW: 14.1 % (ref 11.5–15.5)
WBC: 9 10*3/uL (ref 4.0–10.5)
nRBC: 0 % (ref 0.0–0.2)

## 2021-06-04 NOTE — Anesthesia Postprocedure Evaluation (Signed)
Anesthesia Post Note  Patient: Tara Bender  Procedure(s) Performed: AN [place]     Patient location during evaluation: Mother Baby Anesthesia Type: Epidural Level of consciousness: awake Pain management: satisfactory to patient Vital Signs Assessment: post-procedure vital signs reviewed and stable Respiratory status: spontaneous breathing Cardiovascular status: stable Anesthetic complications: no   No notable events documented.  Last Vitals:  Vitals:   06/03/21 2300 06/04/21 0340  BP: (!) 111/59 107/62  Pulse: 64 65  Resp: 16 18  Temp: 36.5 C 36.8 C  SpO2: 100% 100%    Last Pain:  Vitals:   06/04/21 0340  TempSrc: Axillary  PainSc:    Pain Goal:                   [employer]

## 2021-06-04 NOTE — Lactation Note (Signed)
This note was copied from a baby's chart. Lactation Consultation Note  Patient Name: Tara Bender [id number] Date: 06/04/2021 Reason for consult: Follow-up assessment;Term Age:34 hours  LC in to visit with P3 Mom of term baby.  Baby at 1% weight loss with 2 voids and 3 stools in first day. Mom started supplementing baby with formula due to being unable to express colostrum.  Reassured Mom that this is not an indication of her ability to breastfeed and nourish her baby.  Mom reports that baby is latching well, no discomfort during feeding other than uterine cramping.    Encouraged STS with baby and watching for feeding cues.  Mom to call for assistance with latching to assure that baby is latched deeply and feeding well.  RN set up a DEBP at bedside.  Mom has pumped once and was discouraged that she didn't express any milk.  Encouraged Mom to pump after breastfeeding when baby is supplemented to support a full milk supply.  Reviewed importance of disassembling pump parts, washing, rinsing and air drying in separate bin provided.  Encouraged Mom to call out for assistance prn.  Lactation Tools Discussed/Used Tools: Pump;Bottle;Flanges Flange Size: 24 Breast pump type: Double-Electric Breast Pump Pump Education: Setup, frequency, and cleaning;Milk Storage Reason for Pumping: Support milk supply/Mom supplementing with formula Pumping frequency: Encouraged Mom to double pump for 15 mins with every supplemental bottle Pumped volume: 0 mL (drops)  Interventions Interventions: Breast feeding basics reviewed;Skin to skin;Breast massage;Hand express;DEBP;Education  Discharge    Consult Status Consult Status: Follow-up Date: 06/05/21 Follow-up type: In-patient    Broadus John 06/04/2021, 5:26 PM

## 2021-06-04 NOTE — Social Work (Signed)
CSW received consult for hx of Anxiety. CSW met with MOB to offer support and complete assessment.    CSW met with MOB at bedside and introduced CSW role. CSW observed MOB sitting up in bed holding the infant and FOB placing order for food. MOB presented calm and welcomed CSW visit. CSW inquired how MOB has felt since giving birth. MOB reported feeling good with a good delivery. CSW inquired how MOB felt during the pregnancy. MOB reported that she felt some anxiety as it related to a prior miscarriage. MOB reported that she worried with her older children as well. MOB explained when experienced decreased fetal movement she worried more. MOB reported she has not been officially diagnosed with anxiety. She has discussed her anxiety concerns with her provider and was given a list of therapy resources and offered medication to treat her symptoms. MOB reported she prefers to talk and identified talking with someone as a coping strategy. MOB reported she is not opposed to taking medication for anxiety if needed. MOB reported she feels comfortable reaching out to her provider if she has concerns. MOB identified FOB, her family and sister as supports. MOB reported no history of PPD/anxiety. MOB was receptive resources. CSW provided education regarding the baby blues period vs. perinatal mood disorders, discussed treatment and gave resources for mental health follow up if concerns arise. CSW recommended MOB complete self-evaluation during the postpartum time period using the New Mom Checklist from Postpartum Progress and encouraged MOB to contact a medical professional if symptoms are noted at any time. MOB denied thoughts of harm to self and others.   MOB reported she has essential items for the infant including a bassinet where the infant will sleep. CSW provided review of Sudden Infant Death Syndrome (SIDS) precautions. MOB has chosen [employer] for the infant's follow up care. CSW assessed MOB for  additional needs. MOB reported no further need.   CSW identifies no further need for intervention and no barriers to discharge at this time.   Kathrin Greathouse, MSW, LCSW [facility] [place] Worker  [phone] 06/04/2021  1:12PM

## 2021-06-04 NOTE — Progress Notes (Signed)
PPD #1 No problems Afeb, VSS Fundus firm, NT at U-1 Continue routine postpartum care 

## 2021-06-05 MED ORDER — IBUPROFEN 600 MG PO TABS: 600.0000 mg | ORAL_TABLET | Freq: Four times a day (QID) | ORAL | 0 refills | Status: DC

## 2021-06-05 NOTE — Discharge Instructions (Signed)
As per discharge pamphlet °

## 2021-06-05 NOTE — Progress Notes (Signed)
PPD #2 Doing well Afeb, VSS D/c home 

## 2021-06-05 NOTE — Discharge Summary (Signed)
Postpartum Discharge Summary      Patient Name: Tara Bender DOB: [birth year] MRN: [id number]  Date of admission: 06/03/2021 Delivery date:06/03/2021  Delivering provider: Jerelyn Charles  Date of discharge: 06/05/2021  Admitting diagnosis: Supervision of normal intrauterine pregnancy in multigravida, third trimester [Z34.83] Intrauterine pregnancy: [redacted]w[redacted]d     Secondary diagnosis:  Principal Problem:   Supervision of normal intrauterine pregnancy in multigravida, third trimester    Discharge diagnosis: Term Pregnancy [facility] course: Induction of Labor With Vaginal Delivery   36 y.o. yo [phone] at [redacted]w[redacted]d was admitted to the hospital 06/03/2021 for induction of labor.  Indication for induction: Favorable cervix at term.  Patient had an uncomplicated labor course as follows: Membrane Rupture Time/Date: 10:49 AM ,06/03/2021   Delivery Method:Vaginal, Spontaneous  Episiotomy: None  Lacerations:  None  Details of delivery can be found in separate delivery note.  Patient had a routine postpartum course. Patient is discharged home 06/05/21.  Newborn Data: Birth date:06/03/2021  Birth time:2:47 PM  Gender:Female  Living status:Living  Apgars:8 ,9  Weight:3110 g    Physical exam  Vitals:   06/04/21 0340 06/04/21 1441 06/04/21 2148 06/05/21 0554  BP: 107/62 110/69 (!) 116/56 102/86  Pulse: 65 65 68 65  Resp: 18 20 20 18   Temp: 98.3 F (36.8 C) 97.8 F (36.6 C) 98 F (36.7 C) 98.5 F (36.9 C)  TempSrc: Axillary Axillary Oral Oral  SpO2: 100%  99% 100%  Weight:      Height:       General: alert Lochia: appropriate Uterine Fundus: firm  Labs: Lab Results  Component Value Date   WBC 9.0 06/04/2021   HGB 10.0 (L) 06/04/2021   HCT 31.3 (L) 06/04/2021   MCV 83.5 06/04/2021   PLT 228 06/04/2021   CMP Latest Ref Rng & Units 02/21/2021  Glucose 70 - 99 mg/dL 76  BUN 6 - 20 mg/dL 5(L)  Creatinine 0.44 - 1.00 mg/dL  0.51  Sodium 135 - 145 mmol/L 134(L)  Potassium 3.5 - 5.1 mmol/L 3.3(L)  Chloride 98 - 111 mmol/L 105  CO2 22 - 32 mmol/L 18(L)  Calcium 8.9 - 10.3 mg/dL 8.9  Total Protein 6.5 - 8.1 g/dL 6.7  Total Bilirubin 0.3 - 1.2 mg/dL 0.7  Alkaline Phos 38 - 126 U/L 91  AST 15 - 41 U/L 51(H)  ALT 0 - 44 U/L 55(H)   Flavia Shipper Score: Edinburgh Postnatal Depression Scale Screening Tool 06/03/2021  I have been able to laugh and see the funny side of things. 0  I have looked forward with enjoyment to things. 0  I have blamed myself unnecessarily when things went wrong. 0  I have been anxious or worried for no good reason. 0  I have felt scared or panicky for no good reason. 0  Things have been getting on top of me. 1  I have been so unhappy that I have had difficulty sleeping. 0  I have felt sad or miserable. 0  I have been so unhappy that I have been crying. 0  The thought of harming myself has occurred to me. 0  Edinburgh Postnatal Depression Scale Total 1      After visit meds:  Allergies as  of 06/05/2021   No Known Allergies      Medication List     STOP taking these medications    aspirin EC 81 MG tablet   DICLEGIS PO       TAKE these medications    albuterol 108 (90 Base) MCG/ACT inhaler Commonly known as: VENTOLIN HFA Inhale 1-2 puffs into the lungs every 6 (six) hours as needed for wheezing or shortness of breath.   ibuprofen 600 MG tablet Commonly known as: ADVIL Take 1 tablet (600 mg total) by mouth every 6 (six) hours.   pantoprazole 20 MG tablet Commonly known as: PROTONIX Take 1 tablet (20 mg total) by mouth daily.   polyethylene glycol powder 17 GM/SCOOP powder Commonly known as: GLYCOLAX/MIRALAX Take 17 g by mouth daily.   prenatal multivitamin Tabs tablet Take 1 tablet by mouth daily at 12 noon.         Discharge home in stable condition Infant Feeding: Breast Infant Disposition:home with mother Discharge instruction: per After Visit Summary  and Postpartum booklet. Activity: Advance as tolerated. Pelvic rest for 6 weeks.  Diet: routine diet Postpartum Appointment:4 weeks Follow up Visit:  Follow-up Information     Jerelyn Charles, MD. Schedule an appointment as soon as possible for a visit in 4 week(s).   Specialty: Obstetrics Contact information: [address] [place] [address] [phone]                     06/05/2021 Clarene Duke, MD

## 2021-06-05 NOTE — Lactation Note (Signed)
This note was copied from a baby's chart. Lactation Consultation Note  Patient Name: Tara Bender [id number] Date: 06/05/2021 Reason for consult: Follow-up assessment Age:36 years   P3 mother whose infant is now 34 hours old.  This is a term baby at 39+2 weeks.  Mother's current feeding preference is breast/formula.  Mother had no questions/concerns related to breast feeding.  She plans to continue breast feeding and supplementing with formula until her milk comes to volume.  Supplementation volumes are adequate.  Baby's last LATCH score was a 9; voiding/stooling well.  Mother provided with coconut oil for home use.  She has a DEBP to use as needed.  Family awaiting the pediatrician's visit.  Father present.   Maternal Data    Feeding Nipple Type: Extra Slow Flow  LATCH Score Latch: Grasps breast easily, tongue down, lips flanged, rhythmical sucking.  Audible Swallowing: A few with stimulation  Type of Nipple: Everted at rest and after stimulation  Comfort (Breast/Nipple): Soft / non-tender  Hold (Positioning): No assistance needed to correctly position infant at breast.  LATCH Score: 9   Lactation Tools Discussed/Used    Interventions    Discharge Discharge Education: Engorgement and breast care  Consult Status Consult Status: Complete Date: 06/05/21 Follow-up type: Call as needed    Tara Bender Tara Bender 06/05/2021, 11:45 AM

## 2021-06-10 DIAGNOSIS — R519 Headache, unspecified: Secondary | ICD-10-CM | POA: Diagnosis not present

## 2021-06-10 DIAGNOSIS — R030 Elevated blood-pressure reading, without diagnosis of hypertension: Secondary | ICD-10-CM | POA: Diagnosis not present

## 2021-06-13 ENCOUNTER — Telehealth (HOSPITAL_COMMUNITY): Payer: Self-pay | Admitting: *Deleted

## 2021-06-13 NOTE — Telephone Encounter (Signed)
Patient voiced no questions or concerns at this time. EPDS=0. Patient voiced no questions or concerns regarding infant at this time. Patient reports infant sleeps in a bassinet on her back. RN reviewed ABCs of safe sleep. Patient verbalized understanding. Patient requested RN email information on hospital's virtual  breastfeeding and pumping support groups. Email sent. Erline Levine, RN,  06/13/21, [phone]

## 2021-07-04 ENCOUNTER — Other Ambulatory Visit (HOSPITAL_COMMUNITY): Payer: Self-pay | Admitting: Obstetrics

## 2021-07-04 ENCOUNTER — Other Ambulatory Visit: Payer: Self-pay

## 2021-07-04 ENCOUNTER — Ambulatory Visit (HOSPITAL_COMMUNITY)
Admission: RE | Admit: 2021-07-04 | Discharge: 2021-07-04 | Disposition: A | Discharge status: Home / Self Care | Payer: BC Managed Care – PPO | Source: Ambulatory Visit | Attending: Obstetrics | Admitting: Obstetrics

## 2021-07-04 DIAGNOSIS — Z124 Encounter for screening for malignant neoplasm of cervix: Secondary | ICD-10-CM | POA: Diagnosis not present

## 2021-07-04 DIAGNOSIS — M7989 Other specified soft tissue disorders: Secondary | ICD-10-CM

## 2021-07-04 DIAGNOSIS — Z6841 Body Mass Index (BMI) 40.0 and over, adult: Secondary | ICD-10-CM | POA: Diagnosis not present

## 2021-07-13 DIAGNOSIS — Z0000 Encounter for general adult medical examination without abnormal findings: Secondary | ICD-10-CM | POA: Diagnosis not present

## 2021-07-13 DIAGNOSIS — E559 Vitamin D deficiency, unspecified: Secondary | ICD-10-CM | POA: Diagnosis not present

## 2021-07-13 DIAGNOSIS — K769 Liver disease, unspecified: Secondary | ICD-10-CM | POA: Diagnosis not present

## 2021-07-15 ENCOUNTER — Other Ambulatory Visit: Payer: Self-pay | Admitting: Family Medicine

## 2021-07-15 DIAGNOSIS — K769 Liver disease, unspecified: Secondary | ICD-10-CM

## 2021-07-26 DIAGNOSIS — Z6841 Body Mass Index (BMI) 40.0 and over, adult: Secondary | ICD-10-CM | POA: Diagnosis not present

## 2021-07-26 DIAGNOSIS — Z30430 Encounter for insertion of intrauterine contraceptive device: Secondary | ICD-10-CM | POA: Diagnosis not present

## 2021-07-26 DIAGNOSIS — Z3202 Encounter for pregnancy test, result negative: Secondary | ICD-10-CM | POA: Diagnosis not present

## 2021-08-16 ENCOUNTER — Ambulatory Visit
Admission: RE | Admit: 2021-08-16 | Discharge: 2021-08-16 | Disposition: A | Discharge status: Home / Self Care | Payer: BC Managed Care – PPO | Source: Ambulatory Visit | Attending: Family Medicine | Admitting: Family Medicine

## 2021-08-16 DIAGNOSIS — K769 Liver disease, unspecified: Secondary | ICD-10-CM

## 2021-08-16 MED ORDER — GADOBENATE DIMEGLUMINE 529 MG/ML IV SOLN
20.0000 mL | Freq: Once | INTRAVENOUS | Status: AC | PRN
  Administered 2021-08-16: 20 mL via INTRAVENOUS

## 2021-08-24 DIAGNOSIS — K769 Liver disease, unspecified: Secondary | ICD-10-CM | POA: Diagnosis not present

## 2021-08-24 DIAGNOSIS — Z13220 Encounter for screening for lipoid disorders: Secondary | ICD-10-CM | POA: Diagnosis not present

## 2021-08-24 DIAGNOSIS — E559 Vitamin D deficiency, unspecified: Secondary | ICD-10-CM | POA: Diagnosis not present

## 2021-08-29 DIAGNOSIS — M545 Low back pain, unspecified: Secondary | ICD-10-CM | POA: Diagnosis not present

## 2021-09-03 ENCOUNTER — Encounter (HOSPITAL_COMMUNITY): Payer: Self-pay | Admitting: *Deleted

## 2021-09-03 ENCOUNTER — Emergency Department (HOSPITAL_COMMUNITY)
Admission: EM | Admit: 2021-09-03 | Discharge: 2021-09-04 | Disposition: A | Discharge status: Home / Self Care | Payer: BC Managed Care – PPO | Attending: Emergency Medicine | Admitting: Emergency Medicine

## 2021-09-03 ENCOUNTER — Other Ambulatory Visit: Payer: Self-pay

## 2021-09-03 DIAGNOSIS — M79604 Pain in right leg: Secondary | ICD-10-CM | POA: Diagnosis not present

## 2021-09-03 DIAGNOSIS — M79605 Pain in left leg: Secondary | ICD-10-CM | POA: Diagnosis not present

## 2021-09-03 NOTE — ED Triage Notes (Signed)
The pt has had bi-lateral leg pain for awhile  but today she noticed a large bruise on her lt thigh   some swelling  lmp  may last year  she dekivered in february ?

## 2021-09-03 NOTE — ED Triage Notes (Signed)
She has a iud ?

## 2021-09-04 NOTE — ED Notes (Signed)
This RN went to draw blood from patient and patient was seen walking down the hall, leaving the ED.  Patient ambulated with a steady gait.  ?

## 2021-09-04 NOTE — ED Provider Notes (Signed)
?Tara Bender ?Provider Note ? ? ?CSN: [id number] ?Arrival date & time: 09/03/21  1950 ? ?  ? ?History ? ?Chief Complaint  ?Patient presents with  ? Leg Pain  ? ? ?Tara Bender is a 36 y.o. female. ? ?36 yo F with a chief complaints of bilateral leg pain.  The patient has had this since she was pregnant and felt like it got worse postdelivery.  She had been seeing her family doctor for it but with worsening discomfort was referred to an orthopedic office.  She had a plain film performed there and has plans to have an MRI done in about a week.  She has had some worsening pain and discomfort she also has noticed some changes to the coloration of the skin which she described as looking like bruising.  She had a area pop up on her left thigh that is had resolved now.  She has felt like her legs have been a little bit weak and numb at times.  She has some trouble getting up and getting to the bathroom in time and so has had some mild incontinence.  She denies any fevers.  She did have an epidural. ? ? ?Leg Pain ? ?  ? ?Home Medications ?Prior to Admission medications   ?Medication Sig Start Date End Date Taking? Authorizing Provider  ?albuterol (VENTOLIN HFA) 108 (90 Base) MCG/ACT inhaler Inhale 1-2 puffs into the lungs every 6 (six) hours as needed for wheezing or shortness of breath. 05/18/20   Tedd Sias, PA  ?ibuprofen (ADVIL) 600 MG tablet Take 1 tablet (600 mg total) by mouth every 6 (six) hours. 06/05/21   Meisinger, Sherren Mocha, MD  ?pantoprazole (PROTONIX) 20 MG tablet Take 1 tablet (20 mg total) by mouth daily. 11/21/19   Nuala Alpha A, PA-C  ?polyethylene glycol powder (GLYCOLAX/MIRALAX) 17 GM/SCOOP powder Take 17 g by mouth daily. 12/12/20   Julianne Handler, CNM  ?Prenatal Vit-Fe Fumarate-FA (PRENATAL MULTIVITAMIN) TABS tablet Take 1 tablet by mouth daily at 12 noon.    [provider]  ?   ? ?Allergies    ?Patient has no known allergies.   ? ?Review of  Systems   ?Review of Systems ? ?Physical Exam ?Updated Vital Signs ?BP 109/76 (BP Location: Left Arm)   Pulse 68   Temp 98.9 ?F (37.2 ?C) (Oral)   Resp 20   Ht '5\' 4"'$  (1.626 m)   Wt 108.9 kg   SpO2 100%   Breastfeeding Yes   BMI 41.21 kg/m?  ?Physical Exam ?Vitals and nursing note reviewed.  ?Constitutional:   ?   General: She is not in acute distress. ?   Appearance: She is well-developed. She is not diaphoretic.  ?   Comments: BMI 41  ?HENT:  ?   Head: Normocephalic and atraumatic.  ?Eyes:  ?   Pupils: Pupils are equal, round, and reactive to light.  ?Cardiovascular:  ?   Rate and Rhythm: Normal rate and regular rhythm.  ?   Heart sounds: No murmur heard. ?  No friction rub. No gallop.  ?Pulmonary:  ?   Effort: Pulmonary effort is normal.  ?   Breath sounds: No wheezing or rales.  ?Abdominal:  ?   General: There is no distension.  ?   Palpations: Abdomen is soft.  ?   Tenderness: There is no abdominal tenderness.  ?Musculoskeletal:     ?   General: No tenderness.  ?   Cervical back: Normal range  of motion and neck supple.  ?   Comments: Pulse motor and sensation intact bilateral extremities.  No clonus.  Reflexes 2+ and equal.  No obvious midline spine tenderness step-offs or deformities. ? ?Negative straight leg raise test bilaterally.  ?Skin: ?   General: Skin is warm and dry.  ?Neurological:  ?   Mental Status: She is alert and oriented to person, place, and time.  ?Psychiatric:     ?   Behavior: Behavior normal.  ? ? ?ED Results / Procedures / Treatments   ?Labs ?(all labs ordered are listed, but only abnormal results are displayed) ?Labs Reviewed  ?CBC WITH DIFFERENTIAL/PLATELET  ?COMPREHENSIVE METABOLIC PANEL  ?I-STAT BETA HCG BLOOD, ED ([facility], [facility], [facility] ONLY)  ? ? ?EKG ?None ? ?Radiology ?No results found. ? ?Procedures ?Procedures  ? ? ?Medications Ordered in ED ?Medications - No data to display ? ?ED Course/ Medical Decision Making/ A&P ?  ?                        ?Medical Decision Making ?Amount and/or  Complexity of Data Reviewed ?Labs: ordered. ? ? ?36 yo F with a chief complaints of bilateral leg pain.  This has been going on since she was pregnant.  Delivered about 3 months ago.  She has had worsening low back pain and leg pain since. ? ?She does have some red flags actually based by history though has no focal findings on neurologic exam.  She has a scheduled MRI in about a week.  I did offer to perform an MRI here which she is currently declining.  I will take him laboratory evaluation to assess for platelet abnormality or LFT elevation.  Her blood pressure is normal which is reassuring that this is not postpartum preeclampsia. ? ?The patient unfortunately left prior to having blood work obtained.  I was unable to discuss return precautions with her. ? ? ? ? ? ? ? ? ? ?Final Clinical Impression(s) / ED Diagnoses ?Final diagnoses:  ?None  ? ? ?Rx / DC Orders ?ED Discharge Orders   ? ? None  ? ?  ? ? ?  ?Deno Etienne, DO ?09/04/21 0219 ? ?

## 2021-09-08 DIAGNOSIS — R231 Pallor: Secondary | ICD-10-CM | POA: Diagnosis not present

## 2021-09-08 DIAGNOSIS — M5432 Sciatica, left side: Secondary | ICD-10-CM | POA: Diagnosis not present

## 2021-09-08 DIAGNOSIS — R5383 Other fatigue: Secondary | ICD-10-CM | POA: Diagnosis not present

## 2021-09-12 DIAGNOSIS — M545 Low back pain, unspecified: Secondary | ICD-10-CM | POA: Diagnosis not present

## 2021-09-14 DIAGNOSIS — M25562 Pain in left knee: Secondary | ICD-10-CM | POA: Diagnosis not present

## 2021-10-05 DIAGNOSIS — R231 Pallor: Secondary | ICD-10-CM | POA: Diagnosis not present

## 2021-10-05 DIAGNOSIS — M7070 Other bursitis of hip, unspecified hip: Secondary | ICD-10-CM | POA: Diagnosis not present

## 2021-10-05 DIAGNOSIS — M359 Systemic involvement of connective tissue, unspecified: Secondary | ICD-10-CM | POA: Diagnosis not present

## 2021-10-05 DIAGNOSIS — R21 Rash and other nonspecific skin eruption: Secondary | ICD-10-CM | POA: Diagnosis not present

## 2021-11-10 DIAGNOSIS — M7070 Other bursitis of hip, unspecified hip: Secondary | ICD-10-CM | POA: Diagnosis not present

## 2021-11-10 DIAGNOSIS — R231 Pallor: Secondary | ICD-10-CM | POA: Diagnosis not present

## 2021-11-10 DIAGNOSIS — M359 Systemic involvement of connective tissue, unspecified: Secondary | ICD-10-CM | POA: Diagnosis not present

## 2021-11-10 DIAGNOSIS — R21 Rash and other nonspecific skin eruption: Secondary | ICD-10-CM | POA: Diagnosis not present

## 2022-05-04 DIAGNOSIS — M359 Systemic involvement of connective tissue, unspecified: Secondary | ICD-10-CM | POA: Diagnosis not present

## 2022-05-04 DIAGNOSIS — R231 Pallor: Secondary | ICD-10-CM | POA: Diagnosis not present

## 2022-05-04 DIAGNOSIS — M7070 Other bursitis of hip, unspecified hip: Secondary | ICD-10-CM | POA: Diagnosis not present

## 2022-05-04 DIAGNOSIS — M79606 Pain in leg, unspecified: Secondary | ICD-10-CM | POA: Diagnosis not present

## 2022-05-12 DIAGNOSIS — Z20822 Contact with and (suspected) exposure to covid-19: Secondary | ICD-10-CM | POA: Diagnosis not present

## 2022-05-12 DIAGNOSIS — R059 Cough, unspecified: Secondary | ICD-10-CM | POA: Diagnosis not present

## 2022-05-12 DIAGNOSIS — J020 Streptococcal pharyngitis: Secondary | ICD-10-CM | POA: Diagnosis not present

## 2023-01-28 ENCOUNTER — Other Ambulatory Visit: Payer: Self-pay

## 2023-01-28 ENCOUNTER — Emergency Department (HOSPITAL_COMMUNITY)
Admission: EM | Admit: 2023-01-28 | Discharge: 2023-01-29 | Disposition: A | Discharge status: Home / Self Care | Payer: BC Managed Care – PPO | Attending: Emergency Medicine | Admitting: Emergency Medicine

## 2023-01-28 ENCOUNTER — Encounter (HOSPITAL_COMMUNITY): Payer: Self-pay | Admitting: *Deleted

## 2023-01-28 ENCOUNTER — Emergency Department (HOSPITAL_COMMUNITY): Payer: BC Managed Care – PPO

## 2023-01-28 DIAGNOSIS — J9811 Atelectasis: Secondary | ICD-10-CM | POA: Diagnosis not present

## 2023-01-28 DIAGNOSIS — E876 Hypokalemia: Secondary | ICD-10-CM | POA: Diagnosis not present

## 2023-01-28 DIAGNOSIS — J90 Pleural effusion, not elsewhere classified: Secondary | ICD-10-CM | POA: Diagnosis not present

## 2023-01-28 DIAGNOSIS — R0789 Other chest pain: Secondary | ICD-10-CM | POA: Insufficient documentation

## 2023-01-28 DIAGNOSIS — R0602 Shortness of breath: Secondary | ICD-10-CM | POA: Diagnosis not present

## 2023-01-28 DIAGNOSIS — R079 Chest pain, unspecified: Secondary | ICD-10-CM | POA: Diagnosis not present

## 2023-01-28 DIAGNOSIS — R7989 Other specified abnormal findings of blood chemistry: Secondary | ICD-10-CM | POA: Diagnosis not present

## 2023-01-28 LAB — BASIC METABOLIC PANEL
Anion gap: 14 (ref 5–15)
BUN: 8 mg/dL (ref 6–20)
CO2: 20 mmol/L — ABNORMAL LOW (ref 22–32)
Calcium: 9 mg/dL (ref 8.9–10.3)
Chloride: 105 mmol/L (ref 98–111)
Creatinine, Ser: 0.71 mg/dL (ref 0.44–1.00)
GFR, Estimated: >60 mL/min (ref >60)
Glucose, Bld: 108 mg/dL — ABNORMAL HIGH (ref 70–99)
Potassium: 3.4 mmol/L — ABNORMAL LOW (ref 3.5–5.1)
Sodium: 139 mmol/L (ref 135–145)

## 2023-01-28 LAB — CBC
HCT: 37.1 % (ref 36.0–46.0)
Hemoglobin: 11.6 g/dL — ABNORMAL LOW (ref 12.0–15.0)
MCH: 25.2 pg — ABNORMAL LOW (ref 26.0–34.0)
MCHC: 31.3 g/dL (ref 30.0–36.0)
MCV: 80.7 fL (ref 80.0–100.0)
Platelets: 279 10*3/uL (ref 150–400)
RBC: 4.6 MIL/uL (ref 3.87–5.11)
RDW: 14.3 % (ref 11.5–15.5)
WBC: 10.3 10*3/uL (ref 4.0–10.5)
nRBC: 0 % (ref 0.0–0.2)

## 2023-01-28 LAB — HCG, SERUM, QUALITATIVE: Preg, Serum: NEGATIVE

## 2023-01-28 LAB — TROPONIN I (HIGH SENSITIVITY): Troponin I (High Sensitivity): 4 ng/L (ref <18)

## 2023-01-28 NOTE — ED Triage Notes (Signed)
The pt is  c/o chest pain and sob around 2 hours ago  hx of the same  lmp bc

## 2023-01-29 ENCOUNTER — Emergency Department (HOSPITAL_COMMUNITY): Payer: BC Managed Care – PPO

## 2023-01-29 DIAGNOSIS — J9811 Atelectasis: Secondary | ICD-10-CM | POA: Diagnosis not present

## 2023-01-29 DIAGNOSIS — R0789 Other chest pain: Secondary | ICD-10-CM | POA: Diagnosis not present

## 2023-01-29 DIAGNOSIS — R7989 Other specified abnormal findings of blood chemistry: Secondary | ICD-10-CM | POA: Diagnosis not present

## 2023-01-29 DIAGNOSIS — J90 Pleural effusion, not elsewhere classified: Secondary | ICD-10-CM | POA: Diagnosis not present

## 2023-01-29 DIAGNOSIS — R079 Chest pain, unspecified: Secondary | ICD-10-CM | POA: Diagnosis not present

## 2023-01-29 LAB — D-DIMER, QUANTITATIVE: D-Dimer, Quant: 1.88 ug{FEU}/mL — ABNORMAL HIGH (ref 0.00–0.50)

## 2023-01-29 LAB — TROPONIN I (HIGH SENSITIVITY): Troponin I (High Sensitivity): 3 ng/L (ref <18)

## 2023-01-29 MED ORDER — IOHEXOL 350 MG/ML SOLN
75.0000 mL | Freq: Once | INTRAVENOUS | Status: AC | PRN
Start: 2023-01-29 — End: 2023-01-29
  Administered 2023-01-29: 75 mL via INTRAVENOUS

## 2023-01-29 NOTE — ED Notes (Signed)
Patient transported to CT 

## 2023-01-29 NOTE — ED Notes (Signed)
Pt returned from CT °

## 2023-01-29 NOTE — ED Provider Notes (Signed)
EMERGENCY DEPARTMENT AT [facility] Provider Note   CSN: [id number] Arrival date & time: 01/28/23  2044     History  Chief Complaint  Patient presents with   Chest Pain    Tara Bender is a 37 y.o. female with episodes of chest pain of uncertain etiology, who presents for chest pain in the middle of her chest and a feeling of shortness of breath that started last night.  States she was not doing anything abnormal.  This pain is not worse with exertion, present at rest.  Does not change with position.  She denies any fever or chills.  Denies any recent long plane or car rides, recent hospitalizations, recent surgeries, leg pain or swelling.  She does have an IUD   Chest Pain      Home Medications Prior to Admission medications   Medication Sig Start Date End Date Taking? Authorizing Provider  ibuprofen (ADVIL) 600 MG tablet Take 1 tablet (600 mg total) by mouth every 6 (six) hours. Patient not taking: Reported on 09/04/2021 06/05/21   Meisinger, Tawanna Cooler, MD  Vitamin D, Ergocalciferol, (DRISDOL) 1.25 MG (50000 UNIT) CAPS capsule Take 50,000 Units by mouth every Wednesday. 08/25/21   [provider]      Allergies    Patient has no known allergies.    Review of Systems   Review of Systems  Cardiovascular:  Positive for chest pain.    Physical Exam Updated Vital Signs BP (!) 126/96 (BP Location: Right Arm)   Pulse 86   Temp 98.5 F (36.9 C) (Oral)   Resp 16   Ht 5\' 4"  (1.626 m)   Wt 108.9 kg   SpO2 100%   BMI 41.21 kg/m  Physical Exam Vitals and nursing note reviewed.  Constitutional:      General: She is not in acute distress.    Appearance: She is well-developed.  HENT:     Head: Normocephalic and atraumatic.  Eyes:     Conjunctiva/sclera: Conjunctivae normal.  Cardiovascular:     Rate and Rhythm: Normal rate and regular rhythm.     Heart sounds: No murmur heard.    Comments: 2+ pulses bilaterally Pulmonary:     Effort:  Pulmonary effort is normal. No respiratory distress.     Breath sounds: Normal breath sounds.  Abdominal:     Palpations: Abdomen is soft.     Tenderness: There is no abdominal tenderness.  Musculoskeletal:        General: No swelling.     Cervical back: Neck supple.     Comments: No right or left lower extremity edema, no calf tenderness to palpation  Skin:    General: Skin is warm and dry.     Capillary Refill: Capillary refill takes less than 2 seconds.  Neurological:     Mental Status: She is alert.  Psychiatric:        Mood and Affect: Mood normal.     ED Results / Procedures / Treatments   Labs (all labs ordered are listed, but only abnormal results are displayed) Labs Reviewed  BASIC METABOLIC PANEL - Abnormal; Notable for the following components:      Result Value   Potassium 3.4 (*)    CO2 20 (*)    Glucose, Bld 108 (*)    All other components within normal limits  CBC - Abnormal; Notable for the following components:   Hemoglobin 11.6 (*)    MCH 25.2 (*)    All other  components within normal limits  D-DIMER, QUANTITATIVE - Abnormal; Notable for the following components:   D-Dimer, Quant 1.88 (*)    All other components within normal limits  HCG, SERUM, QUALITATIVE  TROPONIN I (HIGH SENSITIVITY)  TROPONIN I (HIGH SENSITIVITY)    EKG EKG Interpretation Date/Time:  Sunday January 28 2023 21:11:58 EDT Ventricular Rate:  83 PR Interval:  136 QRS Duration:  84 QT Interval:  360 QTC Calculation: 423 R Axis:   42  Text Interpretation: Normal sinus rhythm Nonspecific T wave abnormality Abnormal ECG When compared with ECG of 21-Feb-2021 22:07, PREVIOUS ECG IS PRESENT No acute changes No significant change since last tracing Confirmed by Derwood Kaplan [phone]) on 01/29/2023 11:54:51 AM  Radiology CT Angio Chest PE W and/or Wo Contrast  Result Date: 01/29/2023 CLINICAL DATA:  Elevated D-dimer level. Chest pain and shortness of breath. EXAM: CT ANGIOGRAPHY CHEST  WITH CONTRAST TECHNIQUE: Multidetector CT imaging of the chest was performed using the standard protocol during bolus administration of intravenous contrast. Multiplanar CT image reconstructions and MIPs were obtained to evaluate the vascular anatomy. RADIATION DOSE REDUCTION: This exam was performed according to the departmental dose-optimization program which includes automated exposure control, adjustment of the mA and/or kV according to patient size and/or use of iterative reconstruction technique. CONTRAST:  75mL OMNIPAQUE IOHEXOL 350 MG/ML SOLN COMPARISON:  05/18/2020 FINDINGS: Cardiovascular: No filling defect is identified in the pulmonary arterial tree to suggest pulmonary embolus. Mild cardiomegaly. Mediastinum/Nodes: Unremarkable Lungs/Pleura: Lower lobe atelectasis along both hemidiaphragms and in the right middle lobe. Small amount of pleural fluid localized to the periphery of the right upper major fissure as on image 62 series 7. Upper Abdomen: Unremarkable Musculoskeletal: Mild thoracic spondylosis. Review of the MIP images confirms the above findings. IMPRESSION: 1. No filling defect is identified in the pulmonary arterial tree to suggest pulmonary embolus. 2. Mild cardiomegaly. 3. Lower lobe atelectasis along both hemidiaphragms and in the right middle lobe. 4. Small amount of pleural fluid localized to the periphery of the right upper major fissure. 5. Mild thoracic spondylosis. Electronically Signed   By: Gaylyn Rong M.D.   On: 01/29/2023 13:37   DG Chest 2 View  Result Date: 01/28/2023 CLINICAL DATA:  Chest pain and shortness of breath. EXAM: CHEST - 2 VIEW COMPARISON:  Portable chest 02/21/2021 FINDINGS: The heart size and mediastinal contours are within normal limits. Both lungs are hypoinflated with basilar atelectatic bands but no evidence of focal pneumonia, no substantial pleural effusion. The visualized skeletal structures are unremarkable. IMPRESSION: Hypoinflation with  basilar atelectatic bands. No evidence of focal pneumonia. Electronically Signed   By: Almira Bar M.D.   On: 01/28/2023 22:41    Procedures Procedures    Medications Ordered in ED Medications  iohexol (OMNIPAQUE) 350 MG/ML injection 75 mL (75 mLs Intravenous Contrast Given 01/29/23 1205)    ED Course/ Medical Decision Making/ A&P                                 Medical Decision Making Amount and/or Complexity of Data Reviewed Labs: ordered. Radiology: ordered.  Risk Prescription drug management.   37 y.o. female with pertinent past medical history of intermittent chest pain of uncertain etiology presents to the ED for concern of chest pain over right chest wall that began last night, worse with taking deep breaths  Differential diagnosis includes but is not limited to ACS, DVT, PE, musculoskeletal pain, pneumonia  ED Course:  Patient overall well-appearing, normal vital signs.  Reports her chest pain is over the right upper aspect of her chest wall, worse when taking deep breaths.  This started last night.  She does have a history of episodic chest pain and reports she has been evaluated for this in the past.  No leg pain or swelling, no recent.  Immobilization, recent surgeries, low suspicion for DVT.  However, she does have an IUD and cannot be cleared with [facility], D-dimer was obtained which was elevated at 1.88.  Obtain CTA chest which showed no PE.  CBC without leukocytosis, BMP only with slight hypokalemia at 3.4, Initial troponin of 4, repeat at 3, EKG with normal sinus rhythm, chest pain is nonexertional, no concern for ACS at this time. Upon re-evaluation, patient still very well-appearing, normal vital signs.  She reports no shortness of breath, worsening of symptoms.  She is appropriate for discharge at this time.  Close follow-up with PCP if symptoms have not improved within the next 3 days.  Impression: Typical chest pain  Disposition:  The patient was discharged  home with instructions to follow-up with PCP if symptoms have not improved within the next 3 days Return precautions given.  Lab Tests: I Ordered, and personally interpreted labs.  The pertinent results include:   BMP with slight hypokalemia at 3.4 CBC without leukocytosis First troponin of 4, second troponin of 3 D-dimer at 1.88  Imaging Studies ordered: I ordered imaging studies including CTA chest I independently visualized the imaging with scope of interpretation limited to determining acute life threatening conditions related to emergency care. Imaging showed negative for PE I agree with the radiologist interpretation   Cardiac Monitoring: / EKG: The patient was maintained on a cardiac monitor.  I personally viewed and interpreted the cardiac monitored which showed an underlying rhythm of: normal sinus rhythm             Final Clinical Impression(s) / ED Diagnoses Final diagnoses:  Atypical chest pain    Rx / DC Orders ED Discharge Orders     None         Arabella Merles, PA-C 01/29/23 1358    Derwood Kaplan, MD 01/30/23 [phone]

## 2023-01-29 NOTE — ED Notes (Signed)
Report received from [facility] C. RN. Assumed care of pt at this time.

## 2023-01-29 NOTE — Discharge Instructions (Addendum)
Your CT scan is not show any signs of a PE which is a blood clot in your lung.  Your EKG and troponin which is the cardiac enzyme are normal today.  Your potassium is slightly low.  Please continue to eat foods high in potassium such as bananas and avocados at home.  Please follow-up with your PCP within the next 48 hours if symptoms have not started to improve  Please return immediately to the ER if your chest pain worsens, you have continued shortness of breath, you are coughing up blood,  any other new or concerning symptoms.

## 2023-01-29 NOTE — ED Notes (Signed)
Pt. Stated, Im still having chest pain

## 2023-02-08 IMAGING — US US ABDOMEN LIMITED
1 series · 15 of 25 positions shown · non-contrast
Comparison: MRI 08/27/2019

CLINICAL DATA: Benign neoplasm of liver for follow-up.

EXAM:
ULTRASOUND ABDOMEN LIMITED RIGHT UPPER QUADRANT

[Series 1: us abdomen limited ruq [facility] & [facility] · 15 of 62 slices shown]
[im 1/62]
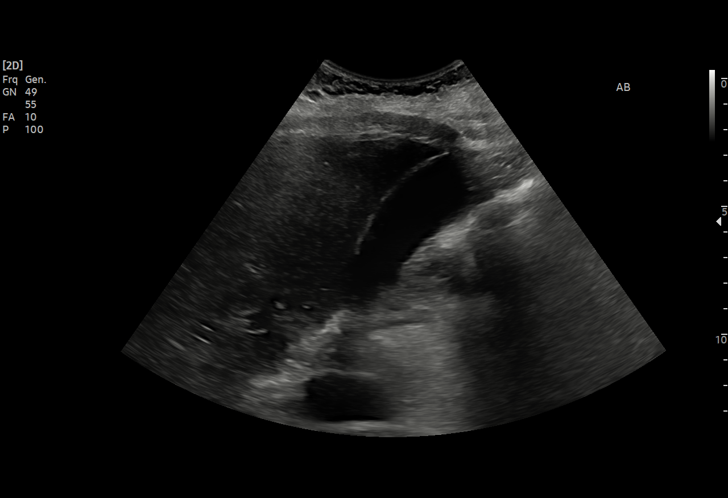
[im 6/62]
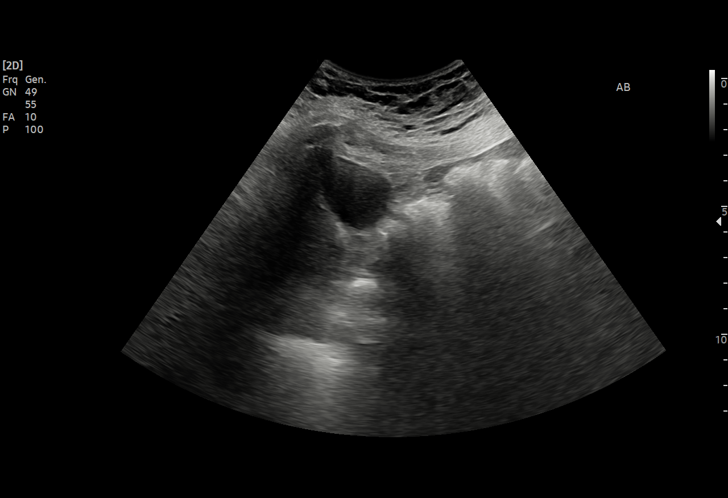
[im 11/62]
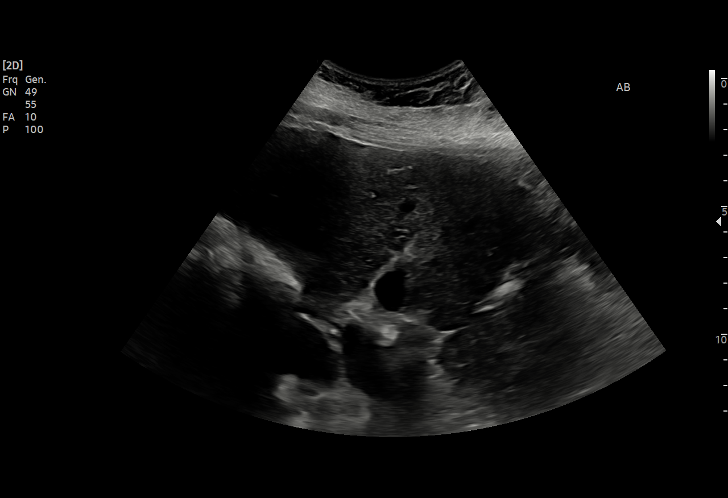
[im 13/62]
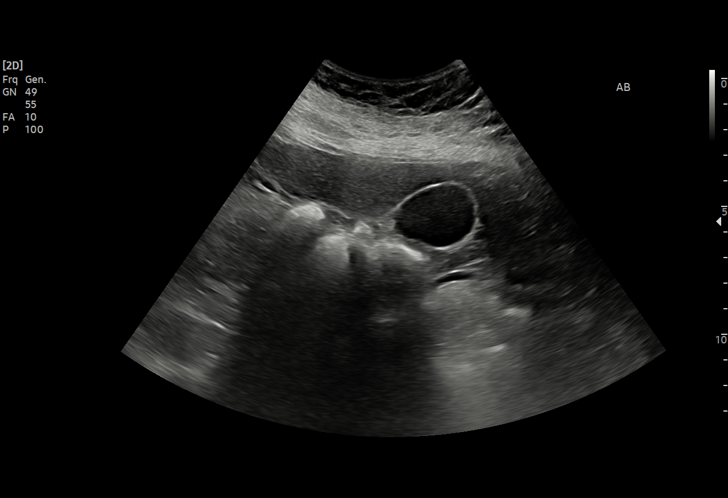
[im 18/62]
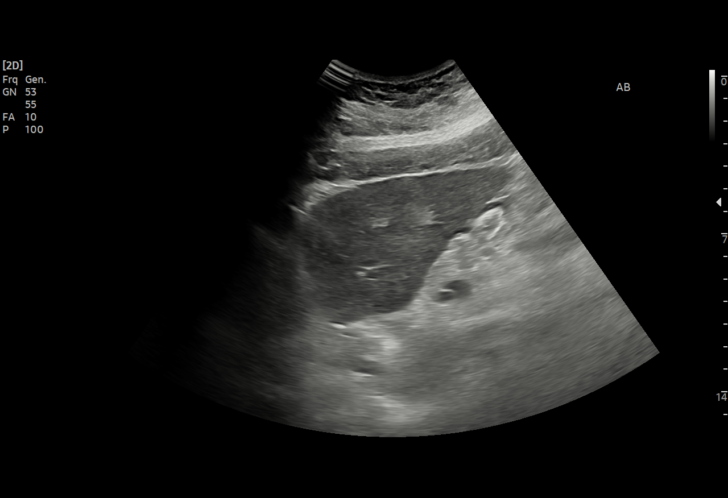
[im 23/62]
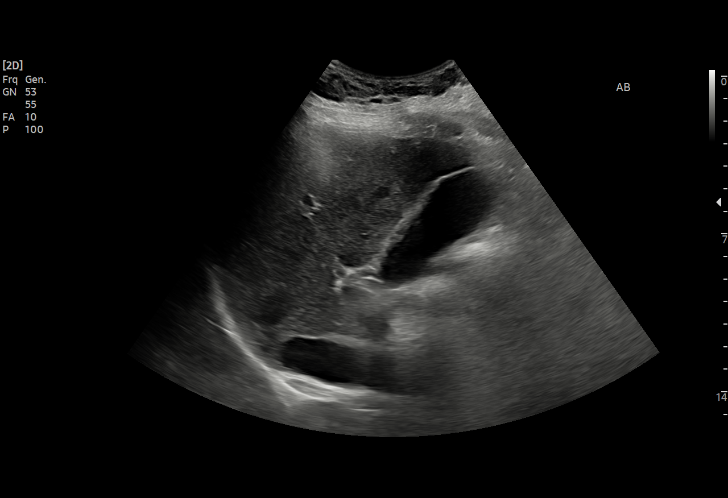
[im 26/62]
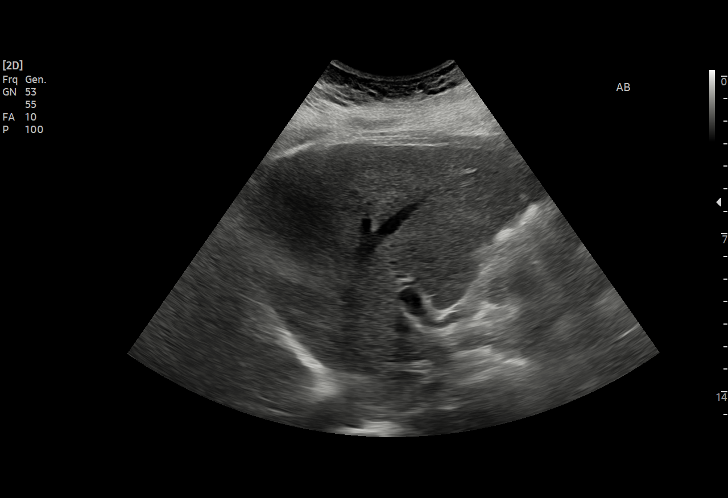
[im 31/62]
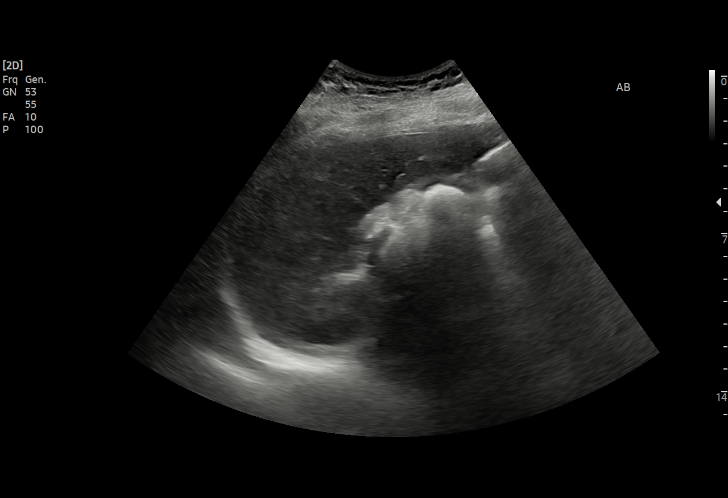
[im 36/62]
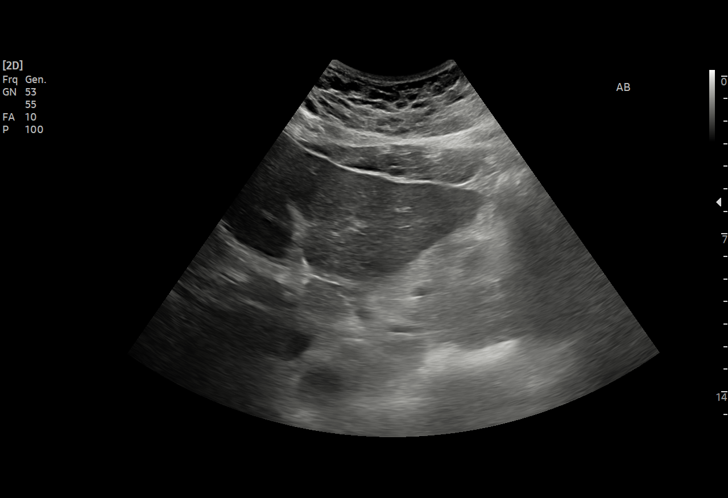
[im 39/62]
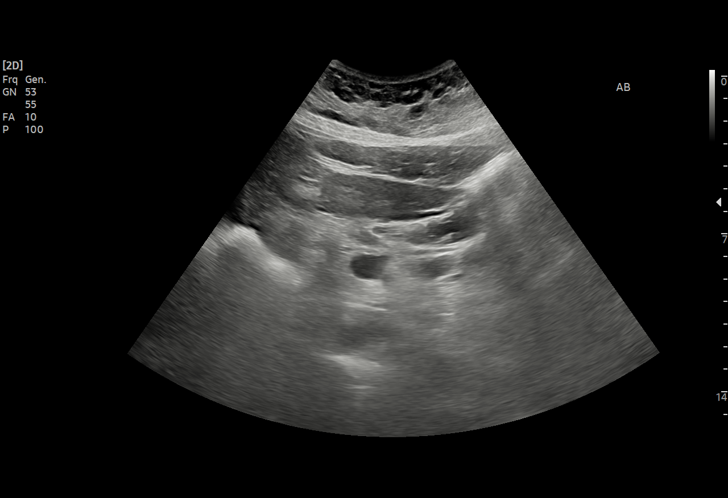
[im 44/62]
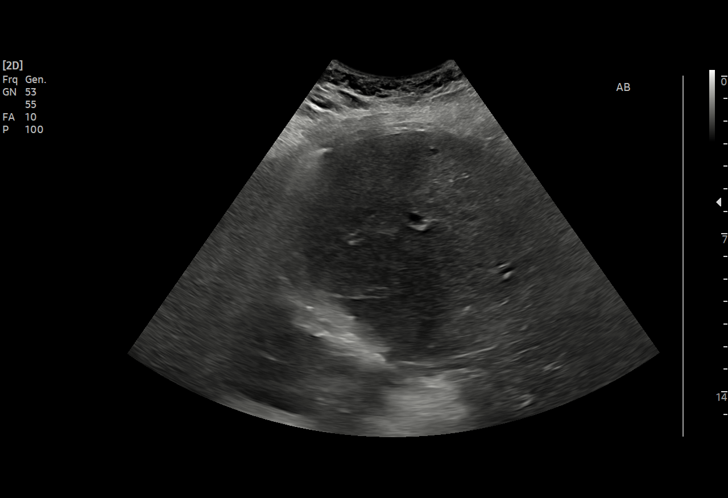
[im 49/62]
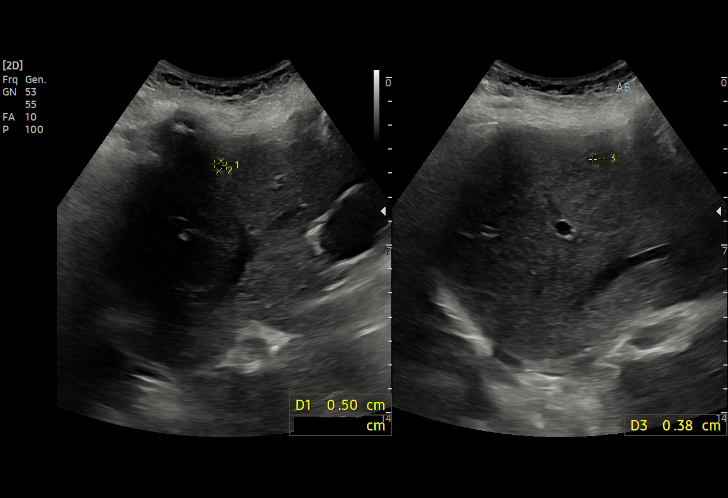
[im 51/62]
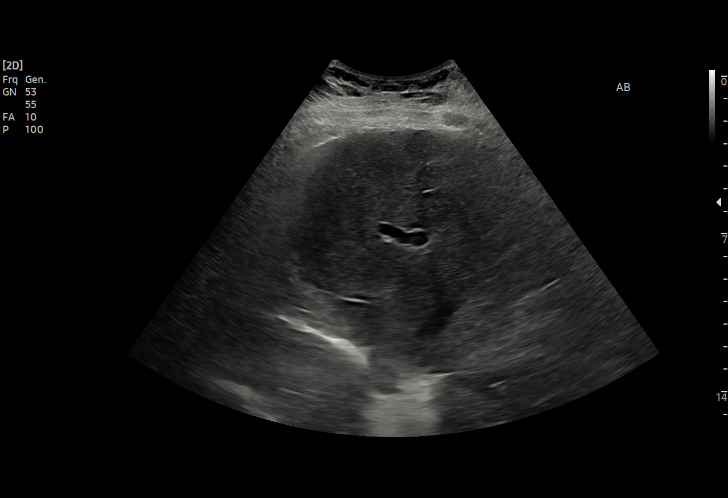
[im 56/62]
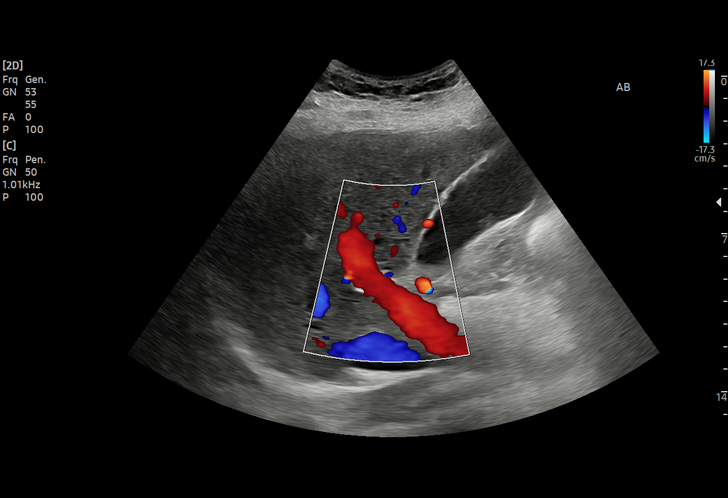
[im 62/62]
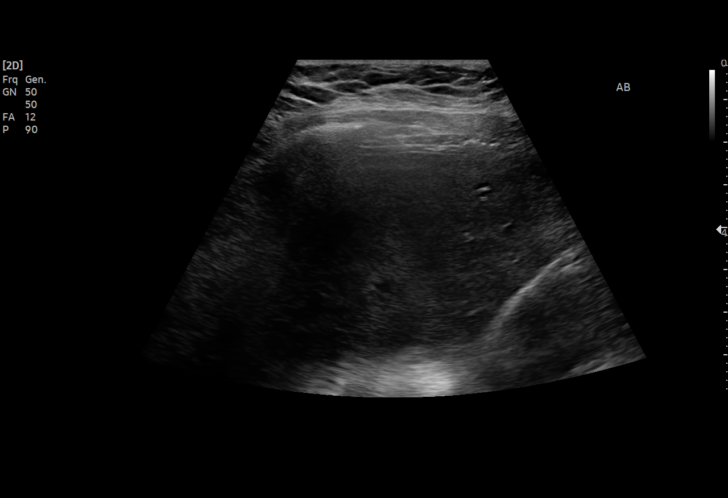

[15 of 25 positions shown; findings below may reference images not displayed]

FINDINGS: Gallbladder:

No gallstones or wall thickening visualized. No sonographic Murphy
sign noted by sonographer.

Common bile duct:

Diameter: 2.5 mm, normal

Liver:

5 mm low-attenuation lesion demonstrated in the anterior right lobe
of the liver. This appears to represent a small cyst. The lesion
previously described at MRI is not definitively identified
sonographically. Portal vein is patent on color Doppler imaging with
normal direction of blood flow towards the liver.

Other: None.
IMPRESSION: No acute abnormalities identified. Known segment 7 lesion from prior
MRI studies is not demonstrated sonographically today. 5 mm cyst
demonstrated in the anterior right lobe.

## 2023-05-31 ENCOUNTER — Encounter (HOSPITAL_COMMUNITY): Payer: Self-pay

## 2023-05-31 ENCOUNTER — Ambulatory Visit (HOSPITAL_COMMUNITY)
Admission: EM | Admit: 2023-05-31 | Discharge: 2023-05-31 | Disposition: A | Discharge status: Home / Self Care | Payer: BC Managed Care – PPO | Attending: Physician Assistant | Admitting: Physician Assistant

## 2023-05-31 DIAGNOSIS — J069 Acute upper respiratory infection, unspecified: Secondary | ICD-10-CM | POA: Insufficient documentation

## 2023-05-31 MED ORDER — PROMETHAZINE-DM 6.25-15 MG/5ML PO SYRP: 5.0000 mL | ORAL_SOLUTION | Freq: Four times a day (QID) | ORAL | 0 refills | Status: AC | PRN

## 2023-05-31 MED ORDER — IBUPROFEN 600 MG PO TABS: 600.0000 mg | ORAL_TABLET | Freq: Three times a day (TID) | ORAL | 0 refills | Status: AC | PRN

## 2023-05-31 MED ORDER — FLUTICASONE PROPIONATE 50 MCG/ACT NA SUSP: 1.0000 | Freq: Every day | NASAL | 0 refills | Status: AC

## 2023-05-31 NOTE — ED Provider Notes (Signed)
 [facility]    CSN: [id number] Arrival date & time: 05/31/23  0807      History   Chief Complaint Chief Complaint  Patient presents with   Fever   Cough   Generalized Body Aches    HPI Tara Bender is a 38 y.o. female.   Patient presents today with a 3-day history of URI symptoms.  Reports fever, cough, nausea, body aches, chest tightness with coughing.  Denies any chest pain, shortness of breath, vomiting, diarrhea.  Has been taking Tylenol  and ibuprofen  without improvement of symptoms.  Reports several household sick contacts with similar symptoms.  She has had COVID last year but not more recently.  She has not had COVID-19 vaccinations.  She is eating and drinking normally.  She has no concern for pregnancy.    Past Medical History:  Diagnosis Date   Anxiety    Bronchitis    uses inhaler prn - rarely uses   Cardiomegaly    Chest pain of uncertain etiology 04/09/2019   Decreased fetal movement determined by examination 12/20/2013   Hand laceration    lt   Headache(784.0)    Hepatic adenoma    HSV infection    Meningitis 2014   viral meningitis   Obesity    Shortness of breath 04/09/2019   Sleep apnea    does not use CPAP   SVD (spontaneous vaginal delivery)    x 1   UTI (urinary tract infection)     Patient Active Problem List   Diagnosis Date Noted   Supervision of normal intrauterine pregnancy in multigravida, third trimester 06/03/2021   Hepatic adenoma 01/04/2021   Cardiomegaly 04/09/2019   Normal vaginal delivery 12/21/2013    Past Surgical History:  Procedure Laterality Date   COLPOSCOPY     multiple colpo done for several different abn paps   DILATION AND EVACUATION N/A 08/16/2012   Procedure: DILATATION AND EVACUATION;  Surgeon: Jon CINDERELLA Rummer, MD;  Location: [facility];  Service: Gynecology;  Laterality: N/A;   left hand surgery     tendon repair   WOUND EXPLORATION  03/14/2012   Procedure: WOUND EXPLORATION;  Surgeon: Franky JONELLE Curia, MD;  Location: [facility];  Service: Orthopedics;  Laterality: Left;  Left Hand Repair Flexor Tendon, Possible Nerve/Artery Repair , debridement of tendon    OB History     Gravida  4   Para  3   Term  3   Preterm      AB  1   Living  3      SAB  1   IAB      Ectopic      Multiple  0   Live Births  3            Home Medications    Prior to Admission medications   Medication Sig Start Date End Date Taking? Authorizing Provider  fluticasone  (FLONASE ) 50 MCG/ACT nasal spray Place 1 spray into both nostrils daily. 05/31/23  Yes Khristian Seals K, PA-C  promethazine -dextromethorphan  (PROMETHAZINE -DM) 6.25-15 MG/5ML syrup Take 5 mLs by mouth 4 (four) times daily as needed for cough. 05/31/23  Yes Vetta Couzens K, PA-C  ibuprofen  (ADVIL ) 600 MG tablet Take 1 tablet (600 mg total) by mouth every 8 (eight) hours as needed. 05/31/23   Vanderbilt Ranieri K, PA-C  Vitamin D, Ergocalciferol, (DRISDOL) 1.25 MG (50000 UNIT) CAPS capsule Take 50,000 Units by mouth every Wednesday. 08/25/21   [provider]  Family History Family History  Problem Relation Age of Onset   Hearing loss Mother    Heart disease Father        heart surgery when born, ? hole in his heart   Asthma Father     Social History Social History   Tobacco Use   Smoking status: Never   Smokeless tobacco: Never  Vaping Use   Vaping status: Never Used  Substance Use Topics   Alcohol use: Not Currently    Comment: occ   Drug use: No     Allergies   Patient has no known allergies.   Review of Systems Review of Systems  Constitutional:  Positive for activity change, fatigue and fever. Negative for appetite change.  HENT:  Positive for congestion. Negative for sinus pressure, sneezing and sore throat.   Respiratory:  Positive for cough and chest tightness. Negative for shortness of breath.   Cardiovascular:  Negative for chest pain.  Gastrointestinal:  Positive for nausea.  Negative for abdominal pain, diarrhea and vomiting.  Musculoskeletal:  Positive for arthralgias and myalgias.  Neurological:  Negative for dizziness, light-headedness and headaches.     Physical Exam Triage Vital Signs ED Triage Vitals [05/31/23 0836]  Encounter Vitals Group     BP 111/77     Systolic BP Percentile      Diastolic BP Percentile      Pulse Rate (!) 108     Resp 17     Temp 99.7 F (37.6 C)     Temp Source Oral     SpO2 96 %     Weight      Height      Head Circumference      Peak Flow      Pain Score 0     Pain Loc      Pain Education      Exclude from Growth Chart    No data found.  Updated Vital Signs BP 111/77 (BP Location: Left Arm)   Pulse 98   Temp 99.7 F (37.6 C) (Oral)   Resp 17   LMP  (LMP Unknown)   SpO2 97%   Breastfeeding No   Visual Acuity Right Eye Distance:   Left Eye Distance:   Bilateral Distance:    Right Eye Near:   Left Eye Near:    Bilateral Near:     Physical Exam Vitals reviewed.  Constitutional:      General: She is awake. She is not in acute distress.    Appearance: Normal appearance. She is well-developed. She is not ill-appearing.     Comments: Very pleasant female appears stated age in no acute distress sitting comfortably in exam room  HENT:     Head: Normocephalic and atraumatic.     Right Ear: Ear canal and external ear normal. A middle ear effusion is present. Tympanic membrane is not erythematous or bulging.     Left Ear: Ear canal and external ear normal. A middle ear effusion is present. Tympanic membrane is not erythematous or bulging.     Nose:     Right Sinus: No maxillary sinus tenderness or frontal sinus tenderness.     Left Sinus: No maxillary sinus tenderness or frontal sinus tenderness.     Mouth/Throat:     Pharynx: Uvula midline. No oropharyngeal exudate or posterior oropharyngeal erythema.  Cardiovascular:     Rate and Rhythm: Normal rate and regular rhythm.     Heart sounds: Normal heart  sounds, S1 normal  and S2 normal. No murmur heard. Pulmonary:     Effort: Pulmonary effort is normal.     Breath sounds: Normal breath sounds. No wheezing, rhonchi or rales.     Comments: Clear to auscultation bilaterally Psychiatric:        Behavior: Behavior is cooperative.      UC Treatments / Results  Labs (all labs ordered are listed, but only abnormal results are displayed) Labs Reviewed  SARS CORONAVIRUS 2 (TAT 6-24 HRS)    EKG   Radiology No results found.  Procedures Procedures (including critical care time)  Medications Ordered in UC Medications - No data to display  Initial Impression / Assessment and Plan / UC Course  I have reviewed the triage vital signs and the nursing notes.  Pertinent labs & imaging results that were available during my care of the patient were reviewed by me and considered in my medical decision making (see chart for details).     Patient is well-appearing, afebrile, nontoxic.  She was initially mildly tachycardic but after sitting quietly for several minutes this normalized.  No evidence of acute infection on physical exam that would warrant initiation of antibiotics.  She has been symptomatic for over 3 days so outside the window of effectiveness for Tamiflu  therefore influenza testing was deferred.  COVID testing was obtained and is pending.  She does have a history of cardiomegaly so would consider antiviral therapy if positive.  She would not require any medication adjustment.  Her last metabolic panel was 01/28/2023 with EGFR of greater than 60.  She was treated symptomatically and given ibuprofen  600 mg to be taken for headache and bodyaches.  We discussed that she is not to take NSAIDs with this medication but can use additional over-the-counter medications including Mucinex, Flonase , Tylenol .  I also recommended nasal saline and sinus rinses.  She is to rest and drink plenty of fluid.  She was given Promethazine  DM for cough and we  discussed that this can be sedating.  She is not to drive drink alcohol while taking it.  We discussed that if anything worsens or changes she is to return for reevaluation.  Strict return precautions given.  Excuse note provided.  Final Clinical Impressions(s) / UC Diagnoses   Final diagnoses:  Upper respiratory tract infection, unspecified type     Discharge Instructions      We will contact you if you are positive for COVID.  Use fluticasone  nasal spray to help with your congestion.  I also recommend Mucinex, Tylenol , nasal saline/sinus rinses.  I have refilled ibuprofen  600 to help with fever and bodyaches.  Do not take additional NSAIDs with this medication including aspirin, ibuprofen /Advil , naproxen /Aleve .  Take Promethazine  DM for cough.  This will make you sleepy so do not drive drink alcohol with taking it.  If your symptoms are not improving within a week or if anything worsens you need to return for reevaluation.     ED Prescriptions     Medication Sig Dispense Auth. Provider   ibuprofen  (ADVIL ) 600 MG tablet Take 1 tablet (600 mg total) by mouth every 8 (eight) hours as needed. 30 tablet Collins Kerby K, PA-C   fluticasone  (FLONASE ) 50 MCG/ACT nasal spray Place 1 spray into both nostrils daily. 16 g Dorise Gangi K, PA-C   promethazine -dextromethorphan  (PROMETHAZINE -DM) 6.25-15 MG/5ML syrup Take 5 mLs by mouth 4 (four) times daily as needed for cough. 118 mL Ladene Allocca K, PA-C      PDMP not reviewed this  encounter.   Sherrell Rocky POUR, PA-C 05/31/23 [phone]

## 2023-05-31 NOTE — Discharge Instructions (Signed)
 We will contact you if you are positive for COVID.  Use fluticasone  nasal spray to help with your congestion.  I also recommend Mucinex, Tylenol , nasal saline/sinus rinses.  I have refilled ibuprofen  600 to help with fever and bodyaches.  Do not take additional NSAIDs with this medication including aspirin, ibuprofen /Advil , naproxen /Aleve .  Take Promethazine  DM for cough.  This will make you sleepy so do not drive drink alcohol with taking it.  If your symptoms are not improving within a week or if anything worsens you need to return for reevaluation.

## 2023-05-31 NOTE — ED Triage Notes (Signed)
 Pt present with c/o fever, cough, nausea and body aches x 3 days Home interventions: Ibuprofen  and Tylenol 

## 2023-06-01 LAB — SARS CORONAVIRUS 2 (TAT 6-24 HRS): SARS Coronavirus 2: NEGATIVE

## 2023-07-13 DIAGNOSIS — M7070 Other bursitis of hip, unspecified hip: Secondary | ICD-10-CM | POA: Diagnosis not present

## 2023-07-13 DIAGNOSIS — R231 Pallor: Secondary | ICD-10-CM | POA: Diagnosis not present

## 2023-07-13 DIAGNOSIS — M79606 Pain in leg, unspecified: Secondary | ICD-10-CM | POA: Diagnosis not present

## 2023-07-13 DIAGNOSIS — M359 Systemic involvement of connective tissue, unspecified: Secondary | ICD-10-CM | POA: Diagnosis not present

## 2023-09-03 DIAGNOSIS — M79606 Pain in leg, unspecified: Secondary | ICD-10-CM | POA: Diagnosis not present

## 2023-09-03 DIAGNOSIS — I776 Arteritis, unspecified: Secondary | ICD-10-CM | POA: Diagnosis not present

## 2023-09-03 DIAGNOSIS — M7070 Other bursitis of hip, unspecified hip: Secondary | ICD-10-CM | POA: Diagnosis not present

## 2023-09-03 DIAGNOSIS — M359 Systemic involvement of connective tissue, unspecified: Secondary | ICD-10-CM | POA: Diagnosis not present

## 2023-09-03 DIAGNOSIS — R231 Pallor: Secondary | ICD-10-CM | POA: Diagnosis not present

## 2023-09-21 ENCOUNTER — Ambulatory Visit: Admitting: Family Medicine

## 2023-09-21 DIAGNOSIS — M359 Systemic involvement of connective tissue, unspecified: Secondary | ICD-10-CM | POA: Diagnosis not present

## 2023-09-21 DIAGNOSIS — M7070 Other bursitis of hip, unspecified hip: Secondary | ICD-10-CM | POA: Diagnosis not present

## 2023-09-21 DIAGNOSIS — I776 Arteritis, unspecified: Secondary | ICD-10-CM | POA: Diagnosis not present

## 2023-09-21 DIAGNOSIS — M79606 Pain in leg, unspecified: Secondary | ICD-10-CM | POA: Diagnosis not present

## 2023-09-21 NOTE — Progress Notes (Deleted)
   Joanna Muck, PhD, LAT, ATC acting as a scribe for Garlan Juniper, MD.  Tara Bender is a 38 y.o. female who presents to [facility] today for hip and knee pain x ***. Pt locates pain to ***  Knee swelling: Mechanical symptoms: Radiates: Aggravates: Treatments tried:  Pertinent review of systems: ***  Relevant historical information: ***   Exam:  There were no vitals taken for this visit. General: Well Developed, well nourished, and in no acute distress.   MSK: ***    Lab and Radiology Results No results found for this or any previous visit (from the past 72 hours). No results found.     Assessment and Plan: 38 y.o. female with ***   PDMP not reviewed this encounter. No orders of the defined types were placed in this encounter.  No orders of the defined types were placed in this encounter.    Discussed warning signs or symptoms. Please see discharge instructions. Patient expresses understanding.   ***

## 2023-10-19 DIAGNOSIS — Z01419 Encounter for gynecological examination (general) (routine) without abnormal findings: Secondary | ICD-10-CM | POA: Diagnosis not present

## 2023-12-18 DIAGNOSIS — G4733 Obstructive sleep apnea (adult) (pediatric): Secondary | ICD-10-CM | POA: Diagnosis not present

## 2023-12-18 DIAGNOSIS — Z0000 Encounter for general adult medical examination without abnormal findings: Secondary | ICD-10-CM | POA: Diagnosis not present

## 2023-12-18 DIAGNOSIS — Z6841 Body Mass Index (BMI) 40.0 and over, adult: Secondary | ICD-10-CM | POA: Diagnosis not present

## 2023-12-18 DIAGNOSIS — E559 Vitamin D deficiency, unspecified: Secondary | ICD-10-CM | POA: Diagnosis not present

## 2023-12-18 DIAGNOSIS — I517 Cardiomegaly: Secondary | ICD-10-CM | POA: Diagnosis not present

## 2023-12-18 DIAGNOSIS — K769 Liver disease, unspecified: Secondary | ICD-10-CM | POA: Diagnosis not present

## 2023-12-28 DIAGNOSIS — R768 Other specified abnormal immunological findings in serum: Secondary | ICD-10-CM | POA: Diagnosis not present

## 2023-12-28 DIAGNOSIS — M542 Cervicalgia: Secondary | ICD-10-CM | POA: Diagnosis not present

## 2023-12-28 DIAGNOSIS — R21 Rash and other nonspecific skin eruption: Secondary | ICD-10-CM | POA: Diagnosis not present

## 2024-03-14 ENCOUNTER — Ambulatory Visit: Admitting: Cardiology

## 2024-05-07 ENCOUNTER — Ambulatory Visit (HOSPITAL_COMMUNITY)
Admission: RE | Admit: 2024-05-07 | Discharge: 2024-05-07 | Disposition: A | Discharge status: Home / Self Care | Source: Ambulatory Visit | Attending: Surgery | Admitting: Surgery

## 2024-05-07 ENCOUNTER — Other Ambulatory Visit (HOSPITAL_COMMUNITY): Payer: Self-pay | Admitting: Medical

## 2024-05-07 DIAGNOSIS — M79602 Pain in left arm: Secondary | ICD-10-CM

## 2024-05-26 ENCOUNTER — Ambulatory Visit: Admitting: Cardiology
